# Patient Record
Sex: Female | Born: 1950 | Race: White | Hispanic: No | Marital: Married | Smoking: Never smoker
Health system: Southern US, Community
[De-identification: ages and names within clinical notes are randomized; demographics above are authoritative.]

## PROBLEM LIST (undated history)

## (undated) DIAGNOSIS — G250 Essential tremor: Secondary | ICD-10-CM

## (undated) DIAGNOSIS — K219 Gastro-esophageal reflux disease without esophagitis: Secondary | ICD-10-CM

## (undated) HISTORY — PX: COLONOSCOPY: SHX174

## (undated) HISTORY — PX: TONSILLECTOMY: SUR1361

## (undated) HISTORY — DX: Gastro-esophageal reflux disease without esophagitis: K21.9

## (undated) HISTORY — DX: Essential tremor: G25.0

---

## 1999-11-20 ENCOUNTER — Encounter: Payer: Self-pay | Admitting: Family Medicine

## 1999-11-20 ENCOUNTER — Encounter: Admission: RE | Admit: 1999-11-20 | Discharge: 1999-11-20 | Payer: Self-pay | Admitting: Family Medicine

## 2001-03-30 ENCOUNTER — Emergency Department (HOSPITAL_COMMUNITY): Admission: EM | Admit: 2001-03-30 | Discharge: 2001-03-31 | Payer: Self-pay | Admitting: Emergency Medicine

## 2001-12-15 ENCOUNTER — Encounter: Payer: Self-pay | Admitting: Family Medicine

## 2001-12-15 ENCOUNTER — Encounter: Admission: RE | Admit: 2001-12-15 | Discharge: 2001-12-15 | Payer: Self-pay | Admitting: Family Medicine

## 2004-03-15 ENCOUNTER — Emergency Department (HOSPITAL_COMMUNITY): Admission: EM | Admit: 2004-03-15 | Discharge: 2004-03-15 | Payer: Self-pay | Admitting: Emergency Medicine

## 2004-08-02 ENCOUNTER — Ambulatory Visit (HOSPITAL_COMMUNITY): Admission: RE | Admit: 2004-08-02 | Discharge: 2004-08-02 | Payer: Self-pay | Admitting: Family Medicine

## 2005-10-23 ENCOUNTER — Ambulatory Visit (HOSPITAL_COMMUNITY): Admission: RE | Admit: 2005-10-23 | Discharge: 2005-10-23 | Payer: Self-pay | Admitting: Family Medicine

## 2006-11-18 ENCOUNTER — Ambulatory Visit (HOSPITAL_COMMUNITY): Admission: RE | Admit: 2006-11-18 | Discharge: 2006-11-18 | Payer: Self-pay | Admitting: Family Medicine

## 2008-02-28 ENCOUNTER — Ambulatory Visit (HOSPITAL_COMMUNITY): Admission: RE | Admit: 2008-02-28 | Discharge: 2008-02-28 | Payer: Self-pay | Admitting: Family Medicine

## 2008-02-28 ENCOUNTER — Encounter (INDEPENDENT_AMBULATORY_CARE_PROVIDER_SITE_OTHER): Payer: Self-pay | Admitting: *Deleted

## 2008-03-02 ENCOUNTER — Ambulatory Visit (HOSPITAL_COMMUNITY): Admission: RE | Admit: 2008-03-02 | Discharge: 2008-03-02 | Payer: Self-pay | Admitting: Family Medicine

## 2009-05-18 ENCOUNTER — Encounter: Payer: Self-pay | Admitting: Internal Medicine

## 2009-05-18 ENCOUNTER — Ambulatory Visit: Payer: Self-pay | Admitting: Internal Medicine

## 2009-05-18 ENCOUNTER — Other Ambulatory Visit: Admission: RE | Admit: 2009-05-18 | Discharge: 2009-05-18 | Payer: Self-pay | Admitting: Internal Medicine

## 2010-01-02 ENCOUNTER — Ambulatory Visit (HOSPITAL_COMMUNITY): Admission: RE | Admit: 2010-01-02 | Discharge: 2010-01-02 | Payer: Self-pay | Admitting: Internal Medicine

## 2010-05-28 ENCOUNTER — Ambulatory Visit: Payer: Self-pay | Admitting: Internal Medicine

## 2010-05-28 ENCOUNTER — Other Ambulatory Visit: Admission: RE | Admit: 2010-05-28 | Discharge: 2010-05-28 | Payer: Self-pay | Admitting: Internal Medicine

## 2010-05-28 DIAGNOSIS — Z8614 Personal history of Methicillin resistant Staphylococcus aureus infection: Secondary | ICD-10-CM | POA: Insufficient documentation

## 2010-05-28 LAB — CONVERTED CEMR LAB
Bilirubin Urine: NEGATIVE
Ketones, urine, test strip: NEGATIVE
Protein, U semiquant: NEGATIVE
pH: 6.5

## 2010-05-29 ENCOUNTER — Encounter: Payer: Self-pay | Admitting: *Deleted

## 2010-05-29 LAB — CONVERTED CEMR LAB
AST: 22 units/L (ref 0–37)
Albumin: 4.8 g/dL (ref 3.5–5.2)
BUN: 18 mg/dL (ref 6–23)
Basophils Relative: 0.8 % (ref 0.0–3.0)
Bilirubin, Direct: 0.1 mg/dL (ref 0.0–0.3)
CO2: 29 meq/L (ref 19–32)
Cholesterol: 213 mg/dL — ABNORMAL HIGH (ref 0–200)
GFR calc non Af Amer: 84.88 mL/min (ref >60)
HCT: 38.1 % (ref 36.0–46.0)
Hemoglobin: 13 g/dL (ref 12.0–15.0)
Lymphocytes Relative: 36.3 % (ref 12.0–46.0)
MCHC: 34.2 g/dL (ref 30.0–36.0)
Monocytes Absolute: 0.4 10*3/uL (ref 0.1–1.0)
Monocytes Relative: 8.7 % (ref 3.0–12.0)
Neutro Abs: 2.5 10*3/uL (ref 1.4–7.7)
Neutrophils Relative %: 52.7 % (ref 43.0–77.0)
Platelets: 212 10*3/uL (ref 150.0–400.0)
Sodium: 139 meq/L (ref 135–145)
TSH: 2.06 microintl units/mL (ref 0.35–5.50)
Total Bilirubin: 0.7 mg/dL (ref 0.3–1.2)
Total CHOL/HDL Ratio: 3
Triglycerides: 91 mg/dL (ref 0.0–149.0)

## 2010-05-31 ENCOUNTER — Telehealth: Payer: Self-pay | Admitting: *Deleted

## 2010-06-03 ENCOUNTER — Telehealth: Payer: Self-pay | Admitting: *Deleted

## 2010-06-27 ENCOUNTER — Ambulatory Visit: Payer: Self-pay | Admitting: Family Medicine

## 2010-06-27 ENCOUNTER — Telehealth: Payer: Self-pay | Admitting: *Deleted

## 2010-06-27 DIAGNOSIS — M25559 Pain in unspecified hip: Secondary | ICD-10-CM

## 2010-07-23 ENCOUNTER — Ambulatory Visit
Admission: RE | Admit: 2010-07-23 | Discharge: 2010-07-23 | Payer: Self-pay | Source: Home / Self Care | Attending: Internal Medicine | Admitting: Internal Medicine

## 2010-07-23 ENCOUNTER — Encounter: Payer: Self-pay | Admitting: Internal Medicine

## 2010-08-04 ENCOUNTER — Encounter: Payer: Self-pay | Admitting: Family Medicine

## 2010-08-05 ENCOUNTER — Encounter: Payer: Self-pay | Admitting: Family Medicine

## 2010-08-11 LAB — CONVERTED CEMR LAB
ALT: 17 units/L (ref 0–35)
BUN: 14 mg/dL (ref 6–23)
Basophils Absolute: 0 10*3/uL (ref 0.0–0.1)
Calcium, Total (PTH): 10 mg/dL (ref 8.4–10.5)
Chloride: 106 meq/L (ref 96–112)
Creatinine, Ser: 0.7 mg/dL (ref 0.4–1.2)
GFR calc non Af Amer: 90.81 mL/min (ref >60)
Glucose, Bld: 84 mg/dL (ref 70–99)
HDL: 60.4 mg/dL (ref >39.00)
MCHC: 34.7 g/dL (ref 30.0–36.0)
MCV: 89.8 fL (ref 78.0–100.0)
Monocytes Absolute: 0.4 10*3/uL (ref 0.1–1.0)
Monocytes Relative: 10.5 % (ref 3.0–12.0)
Neutro Abs: 1.8 10*3/uL (ref 1.4–7.7)
Neutrophils Relative %: 52.1 % (ref 43.0–77.0)
PTH: 26.2 pg/mL (ref 14.0–72.0)
Potassium: 4 meq/L (ref 3.5–5.1)
RBC: 4.22 M/uL (ref 3.87–5.11)
RDW: 12.3 % (ref 11.5–14.6)
Sodium: 144 meq/L (ref 135–145)
Total Protein: 7.9 g/dL (ref 6.0–8.3)
VLDL: 18 mg/dL (ref 0.0–40.0)
WBC: 3.5 10*3/uL — ABNORMAL LOW (ref 4.5–10.5)

## 2010-08-13 NOTE — Progress Notes (Signed)
Summary: Needs Rx Called In  Phone Note Call from Patient Call back at [phone]   Caller: Patient Summary of Call: Pt seen last week possible staph infection was told to call back if no better to have something called in.  Not any better needs rx called in, will be going out of town tomorrow for the holidays Initial call taken by: Trixie Dredge,  June 03, 2010 8:34 AM  Follow-up for Phone Call        Spoke to pt and she states that the staph infection has gone from her nose to her ear. Rt side of nose is swollen and sore. Pt is also having rt ear is swollen and red. Pt states that clear fluid coming out of ear. [facility]. Follow-up by: Romualdo Bolk, CMA Duncan Dull),  June 03, 2010 8:53 AM  Additional Follow-up for Phone Call Additional follow up Details #1::        make sure she has no fever . still need a copy of her culture report.  doxycycline 100 by mouth two times a day disp 20 Make sure she doesnt think it could be   a cold sure herpes like infection as opposed to the staph infections she has gotten.  Seed care if persistent or  progressive   Additional Follow-up by: Madelin Headings MD,  June 03, 2010 9:33 AM    Additional Follow-up for Phone Call Additional follow up Details #2::    Pt aware of this and wants to go ahead with the antibiotic. Pt to call [facility] practice to get [place] a copy of the report.  Rx sent to pharmacy. Follow-up by: Romualdo Bolk, CMA (AAMA),  June 03, 2010 11:29 AM  New/Updated Medications: DOXYCYCLINE HYCLATE 100 MG TABS (DOXYCYCLINE HYCLATE) 1 by mouth two times a day for 10 days Prescriptions: DOXYCYCLINE HYCLATE 100 MG TABS (DOXYCYCLINE HYCLATE) 1 by mouth two times a day for 10 days  #20 x 0   Entered by:   Romualdo Bolk, CMA (AAMA)   Authorized by:   Madelin Headings MD   Signed by:   Romualdo Bolk, CMA (AAMA) on 06/03/2010   Method used:   [facility]        [facility]  [place] [address]  (retail)       [address]       [place], [place]  [address]       Ph: [phone] or [phone]       Fax: [phone]   RxID:   [id number]

## 2010-08-13 NOTE — Progress Notes (Signed)
Summary: Pt has question re: Potassium and Cholesterol results  Phone Note Call from Patient Call back at [phone]   Caller: Patient Summary of Call: Pt had questions re: the potassium and cholesterol lvl lab results she recvd.  Pls call asap.  Initial call taken by: Lucy Antigua,  May 31, 2010 1:16 PM  Follow-up for Phone Call        pt aware of results. Follow-up by: Romualdo Bolk, CMA (AAMA),  May 31, 2010 1:30 PM

## 2010-08-13 NOTE — Assessment & Plan Note (Signed)
Summary: cpx/pap/pt will come in fasting/njr   Vital Signs:  Patient profile:   60 year old female Menstrual status:  postmenopausal Height:      64.5 inches Weight:      133 pounds BMI:     22.56 Pulse rate:   60 / minute BP sitting:   100 / 60  (left arm) Cuff size:   regular  Vitals Entered By: Romualdo Bolk, CMA (AAMA) (May 28, 2010 9:12 AM) CC: CPX with pap, Pt is fasting for labs   History of Present Illness: Jenny [name] comes in today  for preventive visit . Since last visit  here  there have been no major changes in health status  but did have  a  MRSA   infection   [place]  and  had  i and d  at [facility]  ( she says went there cause  close to house and easy to get in )    got better with antibioitc also . Since then  her husband got a boil also and has frecurrent problems  He  goes   to [place]   to get checked.   Has ? about taking care of new born GC to be born Dec 6th  has had flu shot.  Never got dexa scan  .  forgot  was supposed to be on boniva but didnt take.  take ca vit d and exercise . No fam hx of hip fracture   Preventive Care Screening  Prior Values:    Pap Smear:  NEGATIVE FOR INTRAEPITHELIAL LESIONS OR MALIGNANCY. (05/18/2009)    Mammogram:  ASSESSMENT: Negative - BI-RADS 1^MM DIGITAL SCREENING (01/02/2010)    Colonoscopy:  normal (07/14/2006)    Last Tetanus Booster:  Historical (07/14/2006)   Preventive Screening-Counseling & Management  Alcohol-Tobacco     Alcohol drinks/day: 0     Smoking Status: never  Caffeine-Diet-Exercise     Caffeine use/day: 0.5 cup     Does Patient Exercise: yes     Type of exercise: walking     Times/week: 3  Hep-HIV-STD-Contraception     Dental Visit-last 6 months yes     Sun Exposure-Excessive: no  Safety-Violence-Falls     Seat Belt Use: yes     Firearms in the Home: firearms in the home     Firearm Counseling: not indicated; uses recommended firearm safety measures     Smoke Detectors:  yes  Current Medications (verified): 1)  Multivitamins   Tabs (Multiple Vitamin) 2)  Calcium Carbonate-Vitamin D 600-400 Mg-Unit  Tabs (Calcium Carbonate-Vitamin D) 3)  Biotin 10 Mg Tabs (Biotin) 4)  Fish Oil   Oil (Fish Oil)  Allergies (verified): 1)  * Fosamax Physical Exam Gener  Past History:  Past medical, surgical, family and social histories (including risk factors) reviewed, and no changes noted (except as noted below).  Past Medical History: G2 P2 Was placed on thyroid medicine for about a year for abnormal lab but the  normal labs  after stopping  Osteoporosis  by DEXA  2009   had se of  fosamax Hx of MRSA skin infection   Past Surgical History: Reviewed history from 05/18/2009 and no changes required. Tonsillectomy age 89  Past History:  Care Management: Gastroenterology:  Russella Dar  Family History: Reviewed history from 05/18/2009 and no changes required. Father: Cancer- unsure of type-Deceased Mother: overweight, high cholesterol, heart attacks, HBP Siblings: 2 brothers- Healthy  Mom OSteoporosis   Social History: Reviewed history from 05/18/2009 and  no changes required. 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  Originally from [place]   Review of Systems  The patient denies anorexia, fever, weight gain, vision loss, decreased hearing, hoarseness, chest pain, syncope, dyspnea on exertion, peripheral edema, prolonged cough, hemoptysis, abdominal pain, melena, hematochezia, severe indigestion/heartburn, hematuria, genital sores, muscle weakness, transient blindness, difficulty walking, depression, enlarged lymph nodes, angioedema, and breast masses.         teeth    and jaw grinding.    and then HA.   neck and temples no vision change  rest as  per hpi Physical Exam General Appearance: well developed, well nourished, no acute  distress Eyes: conjunctiva and lids normal, PERRLA, EOMI, WNL Ears, Nose, Mouth, Throat: TM clear, nares clear, oral exam WNL Neck: supple, no lymphadenopathy, no thyromegaly, no JVD Respiratory: clear to auscultation and percussion, respiratory effort normal Cardiovascular: regular rate and rhythm, S1-S2, no murmur, rub or gallop, no bruits, peripheral pulses normal and symmetric, no cyanosis, clubbing, edema or varicosities Chest: no scars, masses, tenderness; no asymmetry, skin changes, nipple discharge   Gastrointestinal: soft, non-tender; no hepatosplenomegaly, masses; active bowel sounds all quadrants, guaiac negative stool; no masses, tenderness, hemorrhoids  Genitourinary: no vaginal discharge, lesions; no masses or tenderness PAP done normal exam  NEG  cmt  Lymphatic: no cervical, axillary or inguinal adenopathy Musculoskeletal: gait normal, muscle tone and strength WNL, no joint swelling, effusions, discoloration, crepitus  Skin: clear, good turgor, color WNL, no rashes, lesions, or ulcerations Neurologic: normal mental status, normal reflexes, normal strength, sensation, and motion Psychiatric: alert; oriented to person, place and time Other Exam:    Impression & Recommendations:  Problem # 1:  HEALTH MAINTENANCE EXAM, ADULT (ICD-V70.0) get flu shot and  check on if got tdap  in past   for infant  protection.   Orders: TLB-TSH (Thyroid Stimulating Hormone) (84443-TSH) TLB-Hepatic/Liver Function Pnl (80076-HEPATIC) TLB-CBC Platelet - w/Differential (85025-CBCD) TLB-BMP (Basic Metabolic Panel-BMET) (80048-METABOL) TLB-Lipid Panel (80061-LIPID) UA Dipstick w/o Micro (automated)  (81003) Venipuncture (21308) Specimen Handling (65784)  Problem # 2:  ROUTINE GYNECOLOGICAL EXAM (ICD-V72.31) pap done nl exam.   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 with husband has active infection.      counseled and HO given   Problem # 4:  OSTEOPOROSIS BY DEXA (ICD-733.00) never followed  up  dexa  no fracture in family   has se of meds  . disc  ca vit d and exercise and repeat her dexa as I dont have the previous data. Her updated medication list for this problem includes:    Calcium Carbonate-vitamin D 600-400 Mg-unit Tabs (Calcium carbonate-vitamin d)  Vit D:50 (05/18/2009)Parathyroid:26.2 (05/18/2009)  Complete Medication List: 1)  Multivitamins Tabs (Multiple vitamin) 2)  Calcium Carbonate-vitamin D 600-400 Mg-unit Tabs (Calcium carbonate-vitamin d) 3)  Biotin 10 Mg Tabs (Biotin) 4)  Fish Oil Oil (Fish oil)  Patient Instructions: 1)  You will be informed of lab results when available.  2)  Get [place] a copy of the culture report from the MRSA infection. 3)  Call if getting an infection  and depending on signs  will guide treatment.  4)  Get  dexa scan   call results.  5)  Plan yearly check    Orders Added: 1)  TLB-TSH (Thyroid Stimulating Hormone) [84443-TSH] 2)  TLB-Hepatic/Liver Function Pnl [80076-HEPATIC] 3)  TLB-CBC Platelet - w/Differential [85025-CBCD] 4)  TLB-BMP (Basic Metabolic Panel-BMET) [80048-METABOL] 5)  TLB-Lipid Panel [80061-LIPID] 6)  UA Dipstick w/o Micro (automated)  [81003] 7)  Venipuncture [04540] 8)  Specimen Handling [99000] 9)  Est. Patient 40-64 years [99396] 10)  Pap Smear, Thin Prep ( Collection of) [Q0091] 11)  Est. Patient Level II [98119]    Laboratory Results   Urine Tests  Date/Time Received: May 28, 2010 10:28 AM   Routine Urinalysis   Color: yellow Appearance: Clear Glucose: negative   (Normal Range: Negative) Bilirubin: negative   (Normal Range: Negative) Ketone: negative   (Normal Range: Negative) Spec. Gravity: 1.015   (Normal Range: 1.003-1.035) Blood: negative   (Normal Range: Negative) pH: 6.5   (Normal Range: 5.0-8.0) Protein: negative   (Normal  Range: Negative) Urobilinogen: 0.2   (Normal Range: 0-1) Nitrite: negative   (Normal Range: Negative) Leukocyte Esterace: negative   (Normal Range: Negative)    Comments: Romualdo Bolk, CMA (AAMA)  May 28, 2010 10:29 AM

## 2010-08-13 NOTE — Letter (Signed)
Summary: Generic Letter  [place] [facility]  [address] [place], [place] [address]   Phone: [phone]  Fax: [phone]    05/29/2010  Waldemar Dickens [address] [place], [place]  [address]  Dear Ms. Emanuelson,  (1) TSH (TSH)   FastTSH                   2.06 uIU/mL                 0.35-5.50  Tests: (2) Hepatic/Liver Function Panel (HEPATIC)   Total Bilirubin           0.7 mg/dL                   5.2-8.4   Direct Bilirubin          0.1 mg/dL                   1.3-2.4   Alkaline Phosphatase      104 U/L                     39-117   AST                       22 U/L                      0-37   ALT                       20 U/L                      0-35   Total Protein             7.4 g/dL                    4.0-1.0   Albumin                   4.8 g/dL                    2.7-2.5  Tests: (3) CBC Platelet w/Diff (CBCD)   White Cell Count          4.7 K/uL                    4.5-10.5   Red Cell Count            4.24 Mil/uL                 3.87-5.11   Hemoglobin                13.0 g/dL                   36.6-44.0   Hematocrit                38.1 %                      36.0-46.0   MCV                       89.8 fl                     78.0-100.0   MCHC  34.2 g/dL                   32.9-51.8   RDW                       13.1 %                      11.5-14.6   Platelet Count            212.0 K/uL                  150.0-400.0   Neutrophil %              52.7 %                      43.0-77.0   Lymphocyte %              36.3 %                      12.0-46.0   Monocyte %                8.7 %                       3.0-12.0   Eosinophils%              1.5 %                       0.0-5.0   Basophils %               0.8 %                       0.0-3.0   Neutrophill Absolute      2.5 K/uL                    1.4-7.7   Lymphocyte Absolute       1.7 K/uL                    0.7-4.0   Monocyte Absolute         0.4 K/uL                    0.1-1.0  Eosinophils,  Absolute                             0.1 K/uL                    0.0-0.7   Basophils Absolute        0.0 K/uL                    0.0-0.1  Tests: (4) BMP (METABOL)   Sodium                    139 mEq/L                   135-145   Potassium            [H]  5.4 mEq/L                   3.5-5.1     Sample is slightly hemolyzed   Chloride  102 mEq/L                   96-112   Carbon Dioxide            29 mEq/L                    19-32   Glucose                   90 mg/dL                    16-10   BUN                       18 mg/dL                    9-60   Creatinine                0.7 mg/dL                   4.5-4.0   Calcium                   10.4 mg/dL                  9.8-11.9   GFR                       84.88 mL/min                >60  Tests: (5) Lipid Panel (LIPID)   Cholesterol          [H]  213 mg/dL                   1-478     ATP III Classification            Desirable:  < 200 mg/dL                    Borderline High:  200 - 239 mg/dL               High:  > = 240 mg/dL   Triglycerides             91.0 mg/dL                  2.9-562.1     Normal:  <150 mg/dL     Borderline High:  308 - 199 mg/dL   HDL                       65.78 mg/dL                 >46.96   VLDL Cholesterol          18.2 mg/dL                  2.9-52.8  CHO/HDL Ratio:  CHD Risk                             3                    Men          Women     1/2 Average Risk     3.4          3.3  Average Risk          5.0          4.4     2X Average Risk          9.6          7.1     3X Average Risk          15.0          11.0                           Tests: (6) Cholesterol LDL - Direct (DIRLDL)  Cholesterol LDL - Direct                             125.4 mg/dL     Optimal:  <161 mg/dL     Near or Above Optimal:  100-129 mg/dL     Borderline High:  096-045 mg/dL     High:  409-811 mg/dL     Very High:  >914 mg/dL   Your results are normal. The potassium elevation is not significant but a  lab drawing effect. If you have any questions, please give [place] a call at [phone].         Sincerely,   Tor Netters, CMA (AAMA)

## 2010-08-15 NOTE — Progress Notes (Signed)
Summary: leg pain  Phone Note Call from Patient Call back at Home Phone [phone]   Caller: Patient---triage vm Summary of Call: complains of a kink between leg and hip. She cannot bend or sit down. In horrific pain. please return call, asap. Initial call taken by: Warnell Forester,  June 27, 2010 8:47 AM  Follow-up for Phone Call        Spoke to pt- she woke up the other am with left hip pain. Pt states that she can't bend it without having pain shooting thru it. She can't even set down to go to the bathroom. No injury to the hip area. Pt has never seen a ortho before.   Per Dr. Fabian Sharp- Have her go to [facility] or see Dr. Gary Fleet with [facility] to see Dr. Caryl Never Follow-up by: Romualdo Bolk, CMA Duncan Dull),  June 27, 2010 9:49 AM

## 2010-08-15 NOTE — Assessment & Plan Note (Signed)
Summary: left hip pain/ssc   Vital Signs:  Patient profile:   60 year old female Menstrual status:  postmenopausal Temp:     98.5 degrees F oral BP standing:   100 / 72  (left arm)  Vitals Entered By: Sid Falcon LPN (June 27, 2010 11:02 AM)  History of Present Illness: L pelvis/medial hip pain for 2 days. Noted after rolling over in bed.  no mass or swelling. Pain is sharp and not at rest but worse with hip flexion.  No back pain.  Slightly better with muscle relaxer.  Did not try NSAIDS no fever, appetite change, injury, or weight loss. Does have hx of osteoporosis.  Takes calcium and Vit D.  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  Originally from [place]   Risk Factors: Alcohol Use: 0 (05/28/2010) Caffeine Use: 0.5 cup (05/28/2010) Exercise: yes (05/28/2010)  Risk Factors: Smoking Status: never (05/28/2010) PMH-FH-SH reviewed for relevance  Review of Systems  The patient denies anorexia, fever, weight loss, peripheral edema, abdominal pain, melena, hematochezia, hematuria, incontinence, muscle weakness, and enlarged lymph nodes.    Physical Exam  General:  Well-developed,well-nourished,in no acute distress; alert,appropriate and cooperative throughout  examination Lungs:  Normal respiratory effort, chest expands symmetrically. Lungs are clear to auscultation, no crackles or wheezes. Heart:  Normal rate and regular rhythm. S1 and S2 normal without gallop, murmur, click, rub or other extra sounds. Msk:  No deformity or scoliosis noted of thoracic or lumbar spine.   Extremities:  Full ROM L hip.  Mild pain with hip flexion but not with adduction or abduction.  Knee full ROM. Neurologic:  full strength with plantar and dorsiflexion.   Impression & Recommendations:  Problem # 1:  HIP PAIN (ICD-719.45) suspect hip flexor muscle strain. Try aleve for next week and plain films of hip and pelvis if no better in 1-2 weeks.  Problem # 2:  OSTEOPOROSIS BY DEXA (ICD-733.00) will discuss with Dr Fabian Sharp in Jan. Her updated medication list for this problem includes:    Calcium Carbonate-vitamin D 600-400 Mg-unit Tabs (Calcium carbonate-vitamin d)  Complete Medication List: 1)  Multivitamins Tabs (Multiple vitamin) 2)  Calcium Carbonate-vitamin D 600-400 Mg-unit Tabs (Calcium carbonate-vitamin d) 3)  Biotin 10 Mg Tabs (Biotin) 4)  Fish Oil Oil (Fish oil) 5)  Doxycycline Hyclate 100 Mg Tabs (Doxycycline hyclate) .Marland Kitchen.. 1 by mouth two times a day for 10 days  Patient Instructions: 1)  Try Alleve 2 tablets every 12 hours as needed pain  2)  Touch base by next week if no better.   Orders Added: 1)  Est. Patient Level III [91478]

## 2010-08-15 NOTE — Miscellaneous (Signed)
Summary: BONE DENSITY  Clinical Lists Changes  Orders: Added new Test order of T-Bone Densitometry (77080) - Signed Added new Test order of T-Lumbar Vertebral Assessment (77082) - Signed 

## 2010-11-20 ENCOUNTER — Telehealth: Payer: Self-pay | Admitting: Gastroenterology

## 2010-11-20 NOTE — Telephone Encounter (Signed)
Patient advised that no polyps and she denies a family history of colon CA, change in bowel habits, colon polyps or rectal bleeding.  I have advised her I am waiting on her paper chart for him to review appropriate colon recall

## 2010-11-21 NOTE — Telephone Encounter (Signed)
Colon normal in 10/2003 and at that time I recommended a colonoscopy for routine screening in 7 years. Guidelines now suggest 10 years for routine screening. We can change her recall to 10/2013 or she can have screening now if she prefers.

## 2010-11-21 NOTE — Telephone Encounter (Signed)
Paper chart in your office for review.  Please advise if colon is due now.

## 2010-11-22 NOTE — Telephone Encounter (Signed)
I have left a message for the patient with Sr Stark's recommendations.  Based on our conversation earlier in he week she was not wanting colon now.  I have changed her recall to 10/2013.  She is asked to call back if she would like to proceed with colon now

## 2011-03-26 ENCOUNTER — Encounter: Payer: Self-pay | Admitting: Internal Medicine

## 2011-03-27 ENCOUNTER — Encounter: Payer: Self-pay | Admitting: Internal Medicine

## 2011-03-27 ENCOUNTER — Other Ambulatory Visit (HOSPITAL_COMMUNITY)
Admission: RE | Admit: 2011-03-27 | Discharge: 2011-03-27 | Disposition: A | Discharge status: Home / Self Care | Payer: Managed Care, Other (non HMO) | Source: Ambulatory Visit | Attending: Internal Medicine | Admitting: Internal Medicine

## 2011-03-27 ENCOUNTER — Ambulatory Visit (INDEPENDENT_AMBULATORY_CARE_PROVIDER_SITE_OTHER): Payer: Managed Care, Other (non HMO) | Admitting: Internal Medicine

## 2011-03-27 VITALS — BP 100/60 | HR 62 | Temp 98.5°F | Wt 135.0 lb

## 2011-03-27 DIAGNOSIS — R519 Headache, unspecified: Secondary | ICD-10-CM | POA: Insufficient documentation

## 2011-03-27 DIAGNOSIS — Z01419 Encounter for gynecological examination (general) (routine) without abnormal findings: Secondary | ICD-10-CM | POA: Insufficient documentation

## 2011-03-27 DIAGNOSIS — Z113 Encounter for screening for infections with a predominantly sexual mode of transmission: Secondary | ICD-10-CM | POA: Insufficient documentation

## 2011-03-27 DIAGNOSIS — N760 Acute vaginitis: Secondary | ICD-10-CM | POA: Insufficient documentation

## 2011-03-27 DIAGNOSIS — J329 Chronic sinusitis, unspecified: Secondary | ICD-10-CM

## 2011-03-27 DIAGNOSIS — R51 Headache: Secondary | ICD-10-CM

## 2011-03-27 MED ORDER — AMOXICILLIN-POT CLAVULANATE 875-125 MG PO TABS: 1.0000 | ORAL_TABLET | Freq: Two times a day (BID) | ORAL | Status: AC

## 2011-03-27 NOTE — Progress Notes (Signed)
Subjective:    Patient ID: Jenny [name], female    DOB: 1950/12/15, 60 y.o.   MRN: [id number]  HPI Pt Comes in today for acute visit for a number of concerns :  Sinus congestion for 2 weeks and now cough uncontrollable at night.  NO fever.  Yesterday pm had face pain  .cough and phlegm in the am and mid chest burning with this . No sob or current fever . No tobacco or asthma remot hx of  Walking pneumonia .   Headache for a bout a year Was seen for ha last year  By another practitioner  And no head scan done  . ? If head grinding and jaw clenching  Causing the sx.   rx with  Muscle relaxant.    Has been using tylenol prn  Not daily . Worried about serious disease.  One day had dizziness and imbalance .has fam hx of cancer . No new sx today   1-2 weeks of vaginal discharge without odor or itch and no rx No  recent antibiotic.    No rx  Discharge is   Dark  Thick .  Menopause in 50 s no bleeding.    Married one partner  Due for a check up pap  Has appt in december Review of Systems ROS:  GEN/ HEENTNo fever, significant weight changes sweats  vision problems hearing changes, CV/ PULM; No chest pain shortness of breath cough, syncope,edema  change in exercise tolerance. GI /GU: No adominal pain, vomiting, change in bowel habits. No blood in the stool.  SKIN/HEME: ,no acute skin rashes suspicious lesions or bleeding. No lymphadenopathy, nodules, masses.  NEURO/ PSYCH:  No neurologic signs such as weakness numbness No depression anxiety. IMM/ Allergy: No unusual infections.  Or  Allergy .   REST of 12 system review negative rest as per hpi   Past Medical History  Diagnosis Date  . Osteoporosis     by dexa 2009 had se of fosamax  . Hx MRSA infection    Past Surgical History  Procedure Date  . Tonsillectomy     age 29    reports that she has never smoked. She does not have any smokeless tobacco history on file. She reports that she does not drink alcohol or use illicit  drugs. family history includes Cancer in her father; Heart disease in her mother; Hyperlipidemia in her mother; and Hypertension in her mother. Allergies  Allergen Reactions  . Alendronate Sodium     REACTION: GI se       Objective:   Physical Exam Physical Exam: Vital signs reviewed VHQ:IONG is a well-developed well-nourished alert cooperative  white female who appears her stated age in no acute distress.  Congested  HEENT: normocephalic  traumatic , Eyes: PERRL EOM's full, conjunctiva clear, Nares: paten,t no deformity discharge TENDER anterior right paranasal area ., Ears: no deformity EAC's clear TMs with normal landmarks. Mouth: clear OP, no lesions, edema.  Moist mucous membranes. Dentition in adequate repair. NECK: supple without masses, thyromegaly or bruits. CHEST/PULM:  Clear to auscultation and percussion breath sounds equal no wheeze , rales or rhonchi. No chest wall deformities or tenderness. CV: PMI is nondisplaced, S1 S2 no gallops, murmurs, rubs. Peripheral pulses are full without delay ABDOMEN: Bowel sounds normal nontender  No guard or rebound, no hepato splenomegal no CVA tenderness.  Extremtities:  No clubbing cyanosis or edema, no acute joint swelling or redness no focal atrophy NEURO:  Oriented x3, cranial nerves 3-12  appear to be intact, no obvious focal weakness,gait within normal limits  SKIN: No acute rashes normal turgor, color, no bruising or petechiae. PSYCH: Oriented, good eye contact, no obvious depression anxiety, cognition and judgment appear normal. Pelvic: NL ext GU, labia clear without lesions or rash . Vagina no lesions .Cervix: clear  UTERUS: Neg CMT Adnexa:  clear no masses . PAP done  Minimal discharge  cx os is clear  Pap vag infection  Screen done  Sent for vaginal eval and sti screen and pap     Assessment & Plan:  Prolonged URI prob bact sinusitis   treate empirically for now .   Risk benefit of medication discussed.  noevidene of pneumonia  today Daily HAs   Apparently for a year and problematic and ocass associated sx  REC   Headache referral.  They can order imaging if advised  Vaginitis : ?cause  screen done today   Call results . [facility]  Flu shot today   reschedule cpx pv.   ? Answered about supplements

## 2011-03-27 NOTE — Patient Instructions (Signed)
Will notify you for headache referral. Take antibiotic for presumed bacterial sinusitis Will notify you  of labs when available. Will call about  Wellness appt.

## 2011-04-02 ENCOUNTER — Encounter: Payer: Self-pay | Admitting: *Deleted

## 2011-04-16 ENCOUNTER — Telehealth: Payer: Self-pay | Admitting: Internal Medicine

## 2011-04-16 NOTE — Telephone Encounter (Signed)
Pt would like a cpx first wk in dec. Can I create 30 min slot?

## 2011-04-18 NOTE — Telephone Encounter (Signed)
Ok  On MON tues or wednesday

## 2011-04-21 NOTE — Telephone Encounter (Signed)
Pt is sch for cpx on 06/16/2011 815am

## 2011-06-16 ENCOUNTER — Ambulatory Visit (INDEPENDENT_AMBULATORY_CARE_PROVIDER_SITE_OTHER): Payer: Managed Care, Other (non HMO) | Admitting: Internal Medicine

## 2011-06-16 ENCOUNTER — Encounter: Payer: Self-pay | Admitting: Internal Medicine

## 2011-06-16 VITALS — BP 120/60 | HR 60 | Ht 65.0 in | Wt 136.0 lb

## 2011-06-16 DIAGNOSIS — Z0000 Encounter for general adult medical examination without abnormal findings: Secondary | ICD-10-CM | POA: Insufficient documentation

## 2011-06-16 DIAGNOSIS — M810 Age-related osteoporosis without current pathological fracture: Secondary | ICD-10-CM

## 2011-06-16 LAB — CBC WITH DIFFERENTIAL/PLATELET
Basophils Relative: 0.5 % (ref 0.0–3.0)
Eosinophils Absolute: 0 10*3/uL (ref 0.0–0.7)
Eosinophils Relative: 0.9 % (ref 0.0–5.0)
Hemoglobin: 13.3 g/dL (ref 12.0–15.0)
Lymphs Abs: 0.7 10*3/uL (ref 0.7–4.0)
MCHC: 33.9 g/dL (ref 30.0–36.0)
Monocytes Relative: 7.8 % (ref 3.0–12.0)
Neutro Abs: 3.8 10*3/uL (ref 1.4–7.7)
RBC: 4.42 Mil/uL (ref 3.87–5.11)
RDW: 13.4 % (ref 11.5–14.6)

## 2011-06-16 LAB — HEPATIC FUNCTION PANEL
Alkaline Phosphatase: 106 U/L (ref 39–117)
Total Bilirubin: 0.6 mg/dL (ref 0.3–1.2)
Total Protein: 7.6 g/dL (ref 6.0–8.3)

## 2011-06-16 LAB — LIPID PANEL
Cholesterol: 204 mg/dL — ABNORMAL HIGH (ref 0–200)
Total CHOL/HDL Ratio: 3

## 2011-06-16 LAB — BASIC METABOLIC PANEL
BUN: 18 mg/dL (ref 6–23)
Calcium: 9.7 mg/dL (ref 8.4–10.5)
Creatinine, Ser: 0.8 mg/dL (ref 0.4–1.2)
Glucose, Bld: 89 mg/dL (ref 70–99)
Sodium: 140 mEq/L (ref 135–145)

## 2011-06-16 LAB — LDL CHOLESTEROL, DIRECT: Direct LDL: 120.6 mg/dL

## 2011-06-16 LAB — TSH: TSH: 2.38 u[IU]/mL (ref 0.35–5.50)

## 2011-06-16 NOTE — Progress Notes (Signed)
  Subjective:    Patient ID: Jenny [name], female    DOB: 05/24/1951, 60 y.o.   MRN: [id number]  HPI Patient comes in today for Preventive Health Care visit  No major change in health status since last visit . Had eye check.  Getting eye check  ? Age changes. Exercising no ortho issues .  Now that are limiting. UTD on shots and pap done last time     Review of Systems ROS:  GEN/ HEENTNo fever, significant weight changes sweats headaches vision problems hearing changes, CV/ PULM; No chest pain shortness of breath cough, syncope,edema  change in exercise tolerance. GI /GU: No adominal pain, vomiting, change in bowel habits. No blood in the stool. No significant GU symptoms. SKIN/HEME: ,no acute skin rashes suspicious lesions or bleeding. No lymphadenopathy, nodules, masses.  NEURO/ PSYCH:  No neurologic signs such as weakness numbness No depression anxiety. IMM/ Allergy: No unusual infections.  Allergy .   REST of 12 system review negative  Past history family history social history reviewed in the electronic medical record. Pos dementia and cancer      Objective:   Physical Exam Physical Exam: Vital signs reviewed WUJ:WJXB is a well-developed well-nourished alert cooperative  white female who appears her stated age in no acute distress.  HEENT: normocephalic  traumatic , Eyes: PERRL EOM's full, conjunctiva clear, Nares: paten,t no deformity discharge or tenderness., Ears: no deformity EAC's clear TMs with normal landmarks. Mouth: clear OP, no lesions, edema.  Moist mucous membranes. Dentition in adequate repair. NECK: supple without masses, thyromegaly or bruits. CHEST/PULM:  Clear to auscultation and percussion breath sounds equal no wheeze , rales or rhonchi. No chest wall deformities or tenderness. CV: PMI is nondisplaced, S1 S2 no gallops, murmurs, rubs. Peripheral pulses are full without delay.No JVD .  Breast: normal by inspection . No dimpling, discharge, masses,  tenderness or discharge . LN: no cervical axillary inguinal adenopathy  ABDOMEN: Bowel sounds normal nontender  No guard or rebound, no hepato splenomegal no CVA tenderness.  No hernia. Extremtities:  No clubbing cyanosis or edema, no acute joint swelling or redness no focal atrophy NEURO:  Oriented x3, cranial nerves 3-12 appear to be intact, no obvious focal weakness,gait within normal limits no abnormal reflexes or asymmetrical SKIN: No acute rashes normal turgor, color, no bruising or petechiae. PSYCH: Oriented, good eye contact, no obvious depression anxiety, cognition and judgment appear normal.  PAP ddone in fall is normal        Assessment & Plan:  Preventive Health Care Counseled regarding healthy nutrition, exercise, sleep, injury prevention, calcium vit d and healthy weight . Up to date  on healthcare parameters per hx   Will notify  labs when available.  Hx of abnormal dexa in the past  Will review .   After pt left found  Last dexa 2009  She did indeed have -2.8 at ls spine  Borderline osteoporosis . Consider

## 2011-06-16 NOTE — Patient Instructions (Signed)
Continue lifestyle intervention healthy eating and exercise .  

## 2011-06-17 DIAGNOSIS — M810 Age-related osteoporosis without current pathological fracture: Secondary | ICD-10-CM | POA: Insufficient documentation

## 2011-06-17 NOTE — Progress Notes (Signed)
Quick Note:  Left message to call back. ______ 

## 2011-07-07 ENCOUNTER — Encounter: Payer: Self-pay | Admitting: Internal Medicine

## 2011-09-29 ENCOUNTER — Telehealth: Payer: Self-pay | Admitting: Internal Medicine

## 2011-09-29 DIAGNOSIS — R51 Headache: Secondary | ICD-10-CM

## 2011-09-29 NOTE — Telephone Encounter (Signed)
Pt wants a referral to a headache specialist. Pt is having daily to every other day headaches. No vomiting, sensitivity to light or sound with headaches. But does having nausea with headaches.

## 2011-09-29 NOTE — Telephone Encounter (Signed)
Per Dr. Panosh- ok to do referral. 

## 2011-09-29 NOTE — Telephone Encounter (Signed)
Pt requesting you contact her in regard to headaches that she had spoke to Dr. Fabian Sharp about in last visit.

## 2011-10-10 ENCOUNTER — Other Ambulatory Visit: Payer: Self-pay | Admitting: Internal Medicine

## 2011-10-10 DIAGNOSIS — Z1231 Encounter for screening mammogram for malignant neoplasm of breast: Secondary | ICD-10-CM

## 2011-10-13 ENCOUNTER — Ambulatory Visit (HOSPITAL_COMMUNITY)
Admission: RE | Admit: 2011-10-13 | Discharge: 2011-10-13 | Disposition: A | Discharge status: Home / Self Care | Payer: Managed Care, Other (non HMO) | Source: Ambulatory Visit | Attending: Internal Medicine | Admitting: Internal Medicine

## 2011-10-13 DIAGNOSIS — Z1231 Encounter for screening mammogram for malignant neoplasm of breast: Secondary | ICD-10-CM | POA: Insufficient documentation

## 2012-04-23 ENCOUNTER — Encounter: Payer: Self-pay | Admitting: Internal Medicine

## 2012-05-04 ENCOUNTER — Encounter: Payer: Self-pay | Admitting: Internal Medicine

## 2012-06-14 ENCOUNTER — Other Ambulatory Visit (INDEPENDENT_AMBULATORY_CARE_PROVIDER_SITE_OTHER): Payer: BC Managed Care – PPO

## 2012-06-14 DIAGNOSIS — Z0000 Encounter for general adult medical examination without abnormal findings: Secondary | ICD-10-CM

## 2012-06-14 LAB — HEPATIC FUNCTION PANEL
Albumin: 4.2 g/dL (ref 3.5–5.2)
Bilirubin, Direct: 0 mg/dL (ref 0.0–0.3)
Total Bilirubin: 0.5 mg/dL (ref 0.3–1.2)

## 2012-06-14 LAB — CBC WITH DIFFERENTIAL/PLATELET
Basophils Relative: 1.4 % (ref 0.0–3.0)
HCT: 38.8 % (ref 36.0–46.0)
Lymphs Abs: 1.3 10*3/uL (ref 0.7–4.0)
MCHC: 33 g/dL (ref 30.0–36.0)
MCV: 88.9 fl (ref 78.0–100.0)
Neutrophils Relative %: 45.8 % (ref 43.0–77.0)
Platelets: 192 10*3/uL (ref 150.0–400.0)
RDW: 13.5 % (ref 11.5–14.6)
WBC: 3.3 10*3/uL — ABNORMAL LOW (ref 4.5–10.5)

## 2012-06-14 LAB — BASIC METABOLIC PANEL
CO2: 28 mEq/L (ref 19–32)
Glucose, Bld: 92 mg/dL (ref 70–99)
Potassium: 5.4 mEq/L — ABNORMAL HIGH (ref 3.5–5.1)
Sodium: 141 mEq/L (ref 135–145)

## 2012-06-14 LAB — TSH: TSH: 4.61 u[IU]/mL (ref 0.35–5.50)

## 2012-06-14 LAB — LIPID PANEL
HDL: 56.1 mg/dL (ref >39.00)
VLDL: 20.6 mg/dL (ref 0.0–40.0)

## 2012-06-21 ENCOUNTER — Ambulatory Visit: Payer: Managed Care, Other (non HMO) | Admitting: Internal Medicine

## 2012-06-21 DIAGNOSIS — Z0289 Encounter for other administrative examinations: Secondary | ICD-10-CM

## 2012-06-21 NOTE — Progress Notes (Signed)
No chief complaint on file. pt ns for her appt ehr reviewed

## 2012-07-01 ENCOUNTER — Telehealth: Payer: Self-pay | Admitting: Family Medicine

## 2012-07-01 NOTE — Telephone Encounter (Signed)
This patient was suppose to have OV with you on 06/21/12.  She did not show.  She is scheduled to come and see you on 08/18/12.  What would you like for me to tell her about her lab results?

## 2012-07-04 NOTE — Telephone Encounter (Signed)
Can tell pt results  potassium still slightly high .Make sure not taking  Any potassium supplements.  Can review labs at her OV.

## 2012-07-05 NOTE — Telephone Encounter (Signed)
Left message on cell for the pt to return my call.  Also called home #.  Unable to leave message.

## 2012-07-05 NOTE — Telephone Encounter (Signed)
The pt was notified by telephone and a copy of the results sent by mail.

## 2012-08-09 ENCOUNTER — Ambulatory Visit (INDEPENDENT_AMBULATORY_CARE_PROVIDER_SITE_OTHER): Payer: BC Managed Care – PPO | Admitting: Internal Medicine

## 2012-08-09 ENCOUNTER — Encounter: Payer: Self-pay | Admitting: Internal Medicine

## 2012-08-09 VITALS — BP 112/80 | HR 96 | Temp 97.7°F | Ht 65.0 in | Wt 139.0 lb

## 2012-08-09 DIAGNOSIS — M546 Pain in thoracic spine: Secondary | ICD-10-CM | POA: Insufficient documentation

## 2012-08-09 DIAGNOSIS — Z0000 Encounter for general adult medical examination without abnormal findings: Secondary | ICD-10-CM

## 2012-08-09 DIAGNOSIS — H4720 Unspecified optic atrophy: Secondary | ICD-10-CM

## 2012-08-09 DIAGNOSIS — R7989 Other specified abnormal findings of blood chemistry: Secondary | ICD-10-CM

## 2012-08-09 DIAGNOSIS — R946 Abnormal results of thyroid function studies: Secondary | ICD-10-CM

## 2012-08-09 DIAGNOSIS — E875 Hyperkalemia: Secondary | ICD-10-CM | POA: Insufficient documentation

## 2012-08-09 LAB — BASIC METABOLIC PANEL
CO2: 30 mEq/L (ref 19–32)
Calcium: 10 mg/dL (ref 8.4–10.5)
Creatinine, Ser: 0.8 mg/dL (ref 0.4–1.2)
GFR: 82.97 mL/min (ref >60.00)
Glucose, Bld: 95 mg/dL (ref 70–99)

## 2012-08-09 LAB — T4, FREE: Free T4: 0.77 ng/dL (ref 0.60–1.60)

## 2012-08-09 LAB — TSH: TSH: 1.18 u[IU]/mL (ref 0.35–5.50)

## 2012-08-09 NOTE — Progress Notes (Signed)
Chief Complaint  Patient presents with  . Annual Exam    HPI: Patient comes in today for Preventive Health Care visit   to discuss other issues   Some back pan in am stiff ot get  Up in am.   Some achiness upper back   No cough or sob. Eales like when she had pleurisy. But not associated with respiration or exercise Takes tylenol every day  Helps some  ... No positioning  change .    3 x per  Week treadmil problem  Asks about travel advice going on a [place] cruise how to avoid GI illness.  Patient was told a few years ago by eye Dr. Hughes [employer] that she had optic atrophy. No symptoms of such had an eye check over a year ago from a doctor in [place] (because of insurance network limitations) was told  no evidence of such asks today about this   ROS:  GEN/ HEENT: No fever, significant weight changes sweats headaches vision problems hearing changes, CV/ PULM; No chest pain shortness of breath cough, syncope,edema  change in exercise tolerance. GI /GU: No adominal pain, vomiting, change in bowel habits. No blood in the stool. No significant GU symptoms. SKIN/HEME: ,no acute skin rashes suspicious lesions or bleeding. No lymphadenopathy, nodules, masses.  NEURO/ PSYCH:  No neurologic signs such as weakness numbness. No depression anxiety. IMM/ Allergy: No unusual infections.  Allergy .   REST of 12 system review negative except as per HPI   Past Medical History  Diagnosis Date  . Osteoporosis     by dexa 2009 had se of fosamax  . Hx MRSA infection   . Abnormal thyroid blood test     rx wiht med briefly resolved no sx     Family History  Problem Relation Age of Onset  . Hyperlipidemia Mother   . Heart disease Mother   . Hypertension Mother   . Cancer Father     History   Social History  . Marital Status: Married    Spouse Name: N/A    Number of Children: N/A  . Years of Education: N/A   Social History Main Topics  . Smoking status: Never Smoker   .  Smokeless tobacco: None  . Alcohol Use: No  . Drug Use: No  . Sexually Active:    Other Topics Concern  . 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    Outpatient Encounter Prescriptions as of 08/09/2012  Medication Sig Dispense Refill  . aspirin 81 MG tablet Take 81 mg by mouth daily.        . [DISCONTINUED] Biotin 10 MG TABS Take by mouth.        . [DISCONTINUED] calcium carbonate (OS-CAL - DOSED IN MG OF ELEMENTAL CALCIUM) 1250 MG tablet Take 1 tablet by mouth daily.        . [DISCONTINUED] Cyanocobalamin (VITAMIN B 12 PO) Take by mouth.        . [DISCONTINUED] fish oil-omega-3 fatty acids 1000 MG capsule Take 2 g by mouth daily.        . [DISCONTINUED] MULTIPLE VITAMIN PO Take by mouth.          EXAM:  BP 112/80  Pulse 96  Temp 97.7 F (36.5 C) (Oral)  Ht 5\' 5"  (1.651 m)  Wt 139 lb (63.05 kg)  BMI 23.13 kg/m2  SpO2 97%  Body mass index is 23.13 kg/(m^2).  Physical Exam: Vital signs reviewed GNF:AOZH is a well-developed well-nourished alert cooperative   female who appears her stated age in no acute distress.  HEENT: normocephalic atraumatic , Eyes: PERRL EOM's full, conjunctiva clear, Nares: paten,t no deformity discharge or tenderness., Ears: no deformity EAC's clear TMs with normal landmarks. Mouth: clear OP, no lesions, edema.  Moist mucous membranes. Dentition in adequate repair. NECK: supple without masses, thyromegaly or bruits. CHEST/PULM:  Clear to auscultation and percussion breath sounds equal no wheeze , rales or rhonchi. No chest wall deformities or tenderness. CV: PMI is nondisplaced, S1 S2 no gallops, murmurs, rubs. Peripheral pulses are full without delay.No JVD .  Breast: normal by inspection . No  dimpling, discharge, masses, tenderness or discharge . ABDOMEN: Bowel sounds normal nontender  No guard or rebound, no hepato splenomegal no CVA tenderness.  No hernia. Extremtities:  No clubbing cyanosis or edema, no acute joint swelling or redness no focal atrophy NEURO:  Oriented x3, cranial nerves 3-12 appear to be intact, no obvious focal weakness,gait within normal limits no abnormal reflexes or asymmetrical SKIN: No acute rashes normal turgor, color, no bruising or petechiae. PSYCH: Oriented, good eye contact, no obvious depression anxiety, cognition and judgment appear normal. LN: no cervical axillary inguinal adenopathy  Lab Results  Component Value Date   WBC 3.3* 06/14/2012   HGB 12.8 06/14/2012   HCT 38.8 06/14/2012   PLT 192.0 06/14/2012   GLUCOSE 95 08/09/2012   CHOL 207* 06/14/2012   TRIG 103.0 06/14/2012   HDL 56.10 06/14/2012   LDLDIRECT 136.4 06/14/2012   ALT 31 06/14/2012   AST 26 06/14/2012   NA 140 08/09/2012   K 5.3* 08/09/2012   CL 103 08/09/2012   CREATININE 0.8 08/09/2012   BUN 20 08/09/2012   CO2 30 08/09/2012   TSH 1.18 08/09/2012    ASSESSMENT AND PLAN:  Discussed the following assessment and plan:  1. Visit for preventive health examination     get shingles vaccine when possible mammo  colon usually every 10 years   2. Back pain, thoracic  DG Chest 2 View, Basic metabolic panel, TSH, T4, free   seems mechanichal but a bit atypical consider c xray   can wait but put order in  today  3. Serum potassium elevated  Basic metabolic panel, TSH, T4, free   still seems like lab artifact but was present on last visit  recheck today  4. 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     Patient Care Team: Madelin Headings, MD as PCP - General Meryl Dare, MD,FACG (Gastroenterology) Patient Instructions    Get a ophthalmologist  For an eye exam   To be able to decide if optic atrophy is a condition that you have.    Then if needed may get neurology about this finding and headaches although our exam is good today.  The back discomfort seems to be mechanical i nature and  Good back hygiene may be helpful .    We can get a chest x ray to be  sure no lung issues but your exam is normal.  The potassium issue may be a lab artifact we can repeat this test today  alos with thyroid test to be sure ok. Can use otc med for sea sickness if needed   Can get shingles vaccine at any time . Mammogram at least every 2 years  Or less. Will let you know if any other intervention needed.  If doing well preventive visit in a year  . If having issues with headaches then ROV  Earlier.  Preventive Care for Adults, Female A healthy lifestyle and preventive care can promote health and wellness. Preventive health guidelines for women include the following key practices.  A routine yearly physical is a good way to check with your caregiver about your health and preventive screening. It is a chance to share any concerns and updates on your health, and to receive a thorough exam.  Visit your dentist for a routine exam and preventive care every 6 months. Brush your teeth twice a day and floss once a day. Good oral hygiene prevents tooth decay and gum disease.  The frequency of eye exams is based on your age, health, family medical history, use of contact lenses, and other factors. Follow your caregiver's recommendations for frequency of eye exams.  Eat a healthy diet. Foods like vegetables, fruits, whole grains, low-fat dairy products, and lean protein foods contain the nutrients you need without too many calories. Decrease your intake of foods high in solid fats, added sugars, and salt. Eat the right amount of calories for you.Get information about a proper diet from your caregiver, if necessary.  Regular physical  exercise is one of the most important things you can do for your health. Most adults should get at least 150 minutes of moderate-intensity exercise (any activity that increases your heart rate and causes you to sweat) each week. In addition, most adults need muscle-strengthening exercises on 2 or more days a week.  Maintain a healthy weight. The body mass index (BMI) is a screening tool to identify possible weight problems. It provides an estimate of body fat based on height and weight. Your caregiver can help determine your BMI, and can help you achieve or maintain a healthy weight.For adults 20 years and older:  A BMI below 18.5 is considered underweight.  A BMI of 18.5 to 24.9 is normal.  A BMI of 25 to 29.9 is considered overweight.  A BMI of 30 and above is considered obese.  Maintain normal blood lipids and cholesterol levels by exercising and minimizing your intake of saturated fat. Eat a balanced diet with plenty of fruit and vegetables. Blood tests for lipids and cholesterol should begin at age 63 and be repeated every 5 years. If your lipid or cholesterol levels are high, you are over 50, or you are at high risk for heart disease, you may need your cholesterol levels checked more frequently.Ongoing high lipid and cholesterol levels should be treated with medicines if diet and exercise are not effective.  If you smoke, find out from your caregiver how to quit. If you do not use tobacco, do not start.  If you are pregnant, do not drink alcohol. If you are breastfeeding, be very cautious about drinking alcohol. If you are not pregnant and choose to drink alcohol, do not exceed 1 drink per day. One drink is considered to be 12 ounces (355 mL) of beer, 5 ounces (148 mL) of wine, or 1.5 ounces (44 mL) of liquor.  Avoid use of street drugs. Do not share needles with anyone. Ask for help if you need support or instructions about stopping the use of drugs.  High blood pressure causes heart  disease and increases the risk of stroke. Your blood pressure should be checked at least every 1 to 2 years. Ongoing high blood pressure should be treated with medicines if weight loss and exercise are not effective.  If you are 68 to 63 years old, ask your caregiver if you should take aspirin to prevent strokes.  Diabetes screening involves taking a blood sample to check your fasting blood sugar level. This should be done once every 3 years, after age 3, if you are within normal weight and without risk factors for diabetes. Testing should be considered at a younger age or be carried out more frequently if you are overweight and have at least 1 risk factor for diabetes.  Breast cancer screening is essential preventive care for women. You should practice "breast self-awareness." This means understanding the normal appearance and feel of your breasts and may include breast self-examination. Any changes detected, no matter how small, should be reported to a caregiver. Women in their 21s and 30s should have a clinical breast exam (CBE) by a caregiver as part of a regular health exam every 1 to 3 years. After age 33, women should have a CBE every year. Starting at age 45, women should consider having a mammography (breast X-ray test) every year. Women who have a family history of breast cancer should talk to their caregiver about genetic screening. Women at a high risk of breast cancer should talk to their caregivers about having magnetic resonance imaging (MRI) and a mammography every year.  The Pap test is a screening test for cervical cancer. A Pap test can show cell changes on the cervix that might become cervical cancer if left untreated. A Pap test is a procedure in which cells are obtained and examined from the lower end of the uterus (cervix).  Women should have a Pap test starting at age 2.  Between ages 60 and 13, Pap tests should be repeated every 2 years.  Beginning at age 22, you should have  a Pap test every 3 years as long as the past 3 Pap tests have been normal.  Some women have medical problems that increase the chance of getting cervical cancer. Talk to your caregiver about these problems. It is especially important to talk to your caregiver if a new problem develops soon after your last Pap test. In these cases, your caregiver may recommend more frequent screening and Pap tests.  The above recommendations are the same for women who have or have not gotten the vaccine for human papillomavirus (HPV).  If you had a hysterectomy for a problem that was not cancer or a condition that could lead to cancer, then you no longer need Pap tests. Even if you no longer need a Pap test, a regular exam is a good idea to make sure no other problems are starting.  If you are between ages 35 and 56, and you have had normal Pap tests going back 10 years, you no longer need Pap tests. Even if you no longer need a Pap test, a regular exam is a good idea to make sure no other problems are starting.  If you have had past treatment for cervical cancer or a condition that could lead to cancer, you need Pap tests and screening for cancer for at least  20 years after your treatment.  If Pap tests have been discontinued, risk factors (such as a new sexual partner) need to be reassessed to determine if screening should be resumed.  The HPV test is an additional test that may be used for cervical cancer screening. The HPV test looks for the virus that can cause the cell changes on the cervix. The cells collected during the Pap test can be tested for HPV. The HPV test could be used to screen women aged 17 years and older, and should be used in women of any age who have unclear Pap test results. After the age of 23, women should have HPV testing at the same frequency as a Pap test.  Colorectal cancer can be detected and often prevented. Most routine colorectal cancer screening begins at the age of 42 and continues  through age 32. However, your caregiver may recommend screening at an earlier age if you have risk factors for colon cancer. On a yearly basis, your caregiver may provide home test kits to check for hidden blood in the stool. Use of a small camera at the end of a tube, to directly examine the colon (sigmoidoscopy or colonoscopy), can detect the earliest forms of colorectal cancer. Talk to your caregiver about this at age 89, when routine screening begins. Direct examination of the colon should be repeated every 5 to 10 years through age 59, unless early forms of pre-cancerous polyps or small growths are found.  Hepatitis C blood testing is recommended for all people born from [birth year] through 1965 and any individual with known risks for hepatitis C.  Practice safe sex. Use condoms and avoid high-risk sexual practices to reduce the spread of sexually transmitted infections (STIs). STIs include gonorrhea, chlamydia, syphilis, trichomonas, herpes, HPV, and human immunodeficiency virus (HIV). Herpes, HIV, and HPV are viral illnesses that have no cure. They can result in disability, cancer, and death. Sexually active women aged 100 and younger should be checked for chlamydia. Older women with new or multiple partners should also be tested for chlamydia. Testing for other STIs is recommended if you are sexually active and at increased risk.  Osteoporosis is a disease in which the bones lose minerals and strength with aging. This can result in serious bone fractures. The risk of osteoporosis can be identified using a bone density scan. Women ages 40 and over and women at risk for fractures or osteoporosis should discuss screening with their caregivers. Ask your caregiver whether you should take a calcium supplement or vitamin D to reduce the rate of osteoporosis.  Menopause can be associated with physical symptoms and risks. Hormone replacement therapy is available to decrease symptoms and risks. You should talk to  your caregiver about whether hormone replacement therapy is right for you.  Use sunscreen with sun protection factor (SPF) of 30 or more. Apply sunscreen liberally and repeatedly throughout the day. You should seek shade when your shadow is shorter than you. Protect yourself by wearing long sleeves, pants, a wide-brimmed hat, and sunglasses year round, whenever you are outdoors.  Once a month, do a whole body skin exam, using a mirror to look at the skin on your back. Notify your caregiver of new moles, moles that have irregular borders, moles that are larger than a pencil eraser, or moles that have changed in shape or color.  Stay current with required immunizations.  Influenza. You need a dose every fall (or winter). The composition of the flu vaccine changes each year, so  being vaccinated once is not enough.  Pneumococcal polysaccharide. You need 1 to 2 doses if you smoke cigarettes or if you have certain chronic medical conditions. You need 1 dose at age 1 (or older) if you have never been vaccinated.  Tetanus, diphtheria, pertussis (Tdap, Td). Get 1 dose of Tdap vaccine if you are younger than age 32, are over 49 and have contact with an infant, are a Research scientist (physical sciences), are pregnant, or simply want to be protected from whooping cough. After that, you need a Td booster dose every 10 years. Consult your caregiver if you have not had at least 3 tetanus and diphtheria-containing shots sometime in your life or have a deep or dirty wound.  HPV. You need this vaccine if you are a woman age 49 or younger. The vaccine is given in 3 doses over 6 months.  Measles, mumps, rubella (MMR). You need at least 1 dose of MMR if you were born in 1957 or later. You may also need a second dose.  Meningococcal. If you are age 21 to 71 and a first-year college student living in a residence hall, or have one of several medical conditions, you need to get vaccinated against meningococcal disease. You may also need  additional booster doses.  Zoster (shingles). If you are age 69 or older, you should get this vaccine.  Varicella (chickenpox). If you have never had chickenpox or you were vaccinated but received only 1 dose, talk to your caregiver to find out if you need this vaccine.  Hepatitis A. You need this vaccine if you have a specific risk factor for hepatitis A virus infection or you simply wish to be protected from this disease. The vaccine is usually given as 2 doses, 6 to 18 months apart.  Hepatitis B. You need this vaccine if you have a specific risk factor for hepatitis B virus infection or you simply wish to be protected from this disease. The vaccine is given in 3 doses, usually over 6 months. Preventive Services / Frequency Ages 60 to 24  Blood pressure check.** / Every 1 to 2 years.  Lipid and cholesterol check.** / Every 5 years beginning at age 55.  Clinical breast exam.** / Every 3 years for women in their 61s and 30s.  Pap test.** / Every 2 years from ages 40 through 39. Every 3 years starting at age 71 through age 89 or 30 with a history of 3 consecutive normal Pap tests.  HPV screening.** / Every 3 years from ages 65 through ages 61 to 14 with a history of 3 consecutive normal Pap tests.  Hepatitis C blood test.** / For any individual with known risks for hepatitis C.  Skin self-exam. / Monthly.  Influenza immunization.** / Every year.  Pneumococcal polysaccharide immunization.** / 1 to 2 doses if you smoke cigarettes or if you have certain chronic medical conditions.  Tetanus, diphtheria, pertussis (Tdap, Td) immunization. / A one-time dose of Tdap vaccine. After that, you need a Td booster dose every 10 years.  HPV immunization. / 3 doses over 6 months, if you are 14 and younger.  Measles, mumps, rubella (MMR) immunization. / You need at least 1 dose of MMR if you were born in 1957 or later. You may also need a second dose.  Meningococcal immunization. / 1 dose if you  are age 74 to 63 and a first-year college student living in a residence hall, or have one of several medical conditions, you need to get vaccinated  against meningococcal disease. You may also need additional booster doses.  Varicella immunization.** / Consult your caregiver.  Hepatitis A immunization.** / Consult your caregiver. 2 doses, 6 to 18 months apart.  Hepatitis B immunization.** / Consult your caregiver. 3 doses usually over 6 months. Ages 34 to 63  Blood pressure check.** / Every 1 to 2 years.  Lipid and cholesterol check.** / Every 5 years beginning at age 68.  Clinical breast exam.** / Every year after age 31.  Mammogram.** / Every year beginning at age 55 and continuing for as long as you are in good health. Consult with your caregiver.  Pap test.** / Every 3 years starting at age 75 through age 43 or 11 with a history of 3 consecutive normal Pap tests.  HPV screening.** / Every 3 years from ages 50 through ages 25 to 91 with a history of 3 consecutive normal Pap tests.  Fecal occult blood test (FOBT) of stool. / Every year beginning at age 35 and continuing until age 39. You may not need to do this test if you get a colonoscopy every 10 years.  Flexible sigmoidoscopy or colonoscopy.** / Every 5 years for a flexible sigmoidoscopy or every 10 years for a colonoscopy beginning at age 45 and continuing until age 73.  Hepatitis C blood test.** / For all people born from [birth year] through 1965 and any individual with known risks for hepatitis C.  Skin self-exam. / Monthly.  Influenza immunization.** / Every year.  Pneumococcal polysaccharide immunization.** / 1 to 2 doses if you smoke cigarettes or if you have certain chronic medical conditions.  Tetanus, diphtheria, pertussis (Tdap, Td) immunization.** / A one-time dose of Tdap vaccine. After that, you need a Td booster dose every 10 years.  Measles, mumps, rubella (MMR) immunization. / You need at least 1 dose of MMR if you  were born in 1957 or later. You may also need a second dose.  Varicella immunization.** / Consult your caregiver.  Meningococcal immunization.** / Consult your caregiver.  Hepatitis A immunization.** / Consult your caregiver. 2 doses, 6 to 18 months apart.  Hepatitis B immunization.** / Consult your caregiver. 3 doses, usually over 6 months. Ages 64 and over  Blood pressure check.** / Every 1 to 2 years.  Lipid and cholesterol check.** / Every 5 years beginning at age 78.  Clinical breast exam.** / Every year after age 69.  Mammogram.** / Every year beginning at age 32 and continuing for as long as you are in good health. Consult with your caregiver.  Pap test.** / Every 3 years starting at age 89 through age 31 or 74 with a 3 consecutive normal Pap tests. Testing can be stopped between 65 and 70 with 3 consecutive normal Pap tests and no abnormal Pap or HPV tests in the past 10 years.  HPV screening.** / Every 3 years from ages 23 through ages 27 or 45 with a history of 3 consecutive normal Pap tests. Testing can be stopped between 65 and 70 with 3 consecutive normal Pap tests and no abnormal Pap or HPV tests in the past 10 years.  Fecal occult blood test (FOBT) of stool. / Every year beginning at age 30 and continuing until age 55. You may not need to do this test if you get a colonoscopy every 10 years.  Flexible sigmoidoscopy or colonoscopy.** / Every 5 years for a flexible sigmoidoscopy or every 10 years for a colonoscopy beginning at age 51 and continuing until age 66.  Hepatitis C blood test.** / For all people born from [birth year] through 1966 and any individual with known risks for hepatitis C.  Osteoporosis screening.** / A one-time screening for women ages 51 and over and women at risk for fractures or osteoporosis.  Skin self-exam. / Monthly.  Influenza immunization.** / Every year.  Pneumococcal polysaccharide immunization.** / 1 dose at age 48 (or older) if you have never  been vaccinated.  Tetanus, diphtheria, pertussis (Tdap, Td) immunization. / A one-time dose of Tdap vaccine if you are over 65 and have contact with an infant, are a Research scientist (physical sciences), or simply want to be protected from whooping cough. After that, you need a Td booster dose every 10 years.  Varicella immunization.** / Consult your caregiver.  Meningococcal immunization.** / Consult your caregiver.  Hepatitis A immunization.** / Consult your caregiver. 2 doses, 6 to 18 months apart.  Hepatitis B immunization.** / Check with your caregiver. 3 doses, usually over 6 months. ** Family history and personal history of risk and conditions may change your caregiver's recommendations. Document Released: 08/26/2001 Document Revised: 09/22/2011 Document Reviewed: 11/25/2010 [facility] Patient Information 2013 [employer], [place]. Headaches, Frequently Asked Questions MIGRAINE HEADACHES Q: What is migraine? What causes it? How can I treat it? A: Generally, migraine headaches begin as a dull ache. Then they develop into a constant, throbbing, and pulsating pain. You may experience pain at the temples. You may experience pain at the front or back of one or both sides of the head. The pain is usually accompanied by a combination of:  Nausea.  Vomiting.  Sensitivity to light and noise. Some people (about 15%) experience an aura (see below) before an attack. The cause of migraine is believed to be chemical reactions in the brain. Treatment for migraine may include over-the-counter or prescription medications. It may also include self-help techniques. These include relaxation training and biofeedback.  Q: What is an aura? A: About 15% of people with migraine get an "aura". This is a sign of neurological symptoms that occur before a migraine headache. You may see wavy or jagged lines, dots, or flashing lights. You might experience tunnel vision or blind spots in one or both eyes. The aura can include visual or  auditory hallucinations (something imagined). It may include disruptions in smell (such as strange odors), taste or touch. Other symptoms include:  Numbness.  A "pins and needles" sensation.  Difficulty in recalling or speaking the correct word. These neurological events may last as long as 60 minutes. These symptoms will fade as the headache begins. Q: What is a trigger? A: Certain physical or environmental factors can lead to or "trigger" a migraine. These include:  Foods.  Hormonal changes.  Weather.  Stress. It is important to remember that triggers are different for everyone. To help prevent migraine attacks, you need to figure out which triggers affect you. Keep a headache diary. This is a good way to track triggers. The diary will help you talk to your healthcare professional about your condition. Q: Does weather affect migraines? A: Bright sunshine, hot, humid conditions, and drastic changes in barometric pressure may lead to, or "trigger," a migraine attack in some people. But studies have shown that weather does not act as a trigger for everyone with migraines. Q: What is the link between migraine and hormones? A: Hormones start and regulate many of your body's functions. Hormones keep your body in balance within a constantly changing environment. The levels of hormones in your body are unbalanced at  times. Examples are during menstruation, pregnancy, or menopause. That can lead to a migraine attack. In fact, about three quarters of all women with migraine report that their attacks are related to the menstrual cycle.  Q: Is there an increased risk of stroke for migraine sufferers? A: The likelihood of a migraine attack causing a stroke is very remote. That is not to say that migraine sufferers cannot have a stroke associated with their migraines. In persons under age 35, the most common associated factor for stroke is migraine headache. But over the course of a person's normal life  span, the occurrence of migraine headache may actually be associated with a reduced risk of dying from cerebrovascular disease due to stroke.  Q: What are acute medications for migraine? A: Acute medications are used to treat the pain of the headache after it has started. Examples over-the-counter medications, NSAIDs, ergots, and triptans.  Q: What are the triptans? A: Triptans are the newest class of abortive medications. They are specifically targeted to treat migraine. Triptans are vasoconstrictors. They moderate some chemical reactions in the brain. The triptans work on receptors in your brain. Triptans help to restore the balance of a neurotransmitter called serotonin. Fluctuations in levels of serotonin are thought to be a main cause of migraine.  Q: Are over-the-counter medications for migraine effective? A: Over-the-counter, or "OTC," medications may be effective in relieving mild to moderate pain and associated symptoms of migraine. But you should see your caregiver before beginning any treatment regimen for migraine.  Q: What are preventive medications for migraine? A: Preventive medications for migraine are sometimes referred to as "prophylactic" treatments. They are used to reduce the frequency, severity, and length of migraine attacks. Examples of preventive medications include antiepileptic medications, antidepressants, beta-blockers, calcium channel blockers, and NSAIDs (nonsteroidal anti-inflammatory drugs). Q: Why are anticonvulsants used to treat migraine? A: During the past few years, there has been an increased interest in antiepileptic drugs for the prevention of migraine. They are sometimes referred to as "anticonvulsants". Both epilepsy and migraine may be caused by similar reactions in the brain.  Q: Why are antidepressants used to treat migraine? A: Antidepressants are typically used to treat people with depression. They may reduce migraine frequency by regulating chemical  levels, such as serotonin, in the brain.  Q: What alternative therapies are used to treat migraine? A: The term "alternative therapies" is often used to describe treatments considered outside the scope of conventional Western medicine. Examples of alternative therapy include acupuncture, acupressure, and yoga. Another common alternative treatment is herbal therapy. Some herbs are believed to relieve headache pain. Always discuss alternative therapies with your caregiver before proceeding. Some herbal products contain arsenic and other toxins. TENSION HEADACHES Q: What is a tension-type headache? What causes it? How can I treat it? A: Tension-type headaches occur randomly. They are often the result of temporary stress, anxiety, fatigue, or anger. Symptoms include soreness in your temples, a tightening band-like sensation around your head (a "vice-like" ache). Symptoms can also include a pulling feeling, pressure sensations, and contracting head and neck muscles. The headache begins in your forehead, temples, or the back of your head and neck. Treatment for tension-type headache may include over-the-counter or prescription medications. Treatment may also include self-help techniques such as relaxation training and biofeedback. CLUSTER HEADACHES Q: What is a cluster headache? What causes it? How can I treat it? A: Cluster headache gets its name because the attacks come in groups. The pain arrives with little, if any, warning.  It is usually on one side of the head. A tearing or bloodshot eye and a runny nose on the same side of the headache may also accompany the pain. Cluster headaches are believed to be caused by chemical reactions in the brain. They have been described as the most severe and intense of any headache type. Treatment for cluster headache includes prescription medication and oxygen. SINUS HEADACHES Q: What is a sinus headache? What causes it? How can I treat it? A: When a cavity in the bones  of the face and skull (a sinus) becomes inflamed, the inflammation will cause localized pain. This condition is usually the result of an allergic reaction, a tumor, or an infection. If your headache is caused by a sinus blockage, such as an infection, you will probably have a fever. An x-ray will confirm a sinus blockage. Your caregiver's treatment might include antibiotics for the infection, as well as antihistamines or decongestants.  REBOUND HEADACHES Q: What is a rebound headache? What causes it? How can I treat it? A: A pattern of taking acute headache medications too often can lead to a condition known as "rebound headache." A pattern of taking too much headache medication includes taking it more than 2 days per week or in excessive amounts. That means more than the label or a caregiver advises. With rebound headaches, your medications not only stop relieving pain, they actually begin to cause headaches. Doctors treat rebound headache by tapering the medication that is being overused. Sometimes your caregiver will gradually substitute a different type of treatment or medication. Stopping may be a challenge. Regularly overusing a medication increases the potential for serious side effects. Consult a caregiver if you regularly use headache medications more than 2 days per week or more than the label advises. ADDITIONAL QUESTIONS AND ANSWERS Q: What is biofeedback? A: Biofeedback is a self-help treatment. Biofeedback uses special equipment to monitor your body's involuntary physical responses. Biofeedback monitors:  Breathing.  Pulse.  Heart rate.  Temperature.  Muscle tension.  Brain activity. Biofeedback helps you refine and perfect your relaxation exercises. You learn to control the physical responses that are related to stress. Once the technique has been mastered, you do not need the equipment any more. Q: Are headaches hereditary? A: Four out of five (80%) of people that suffer report a  family history of migraine. Scientists are not sure if this is genetic or a family predisposition. Despite the uncertainty, a child has a 50% chance of having migraine if one parent suffers. The child has a 75% chance if both parents suffer.  Q: Can children get headaches? A: By the time they reach high school, most young people have experienced some type of headache. Many safe and effective approaches or medications can prevent a headache from occurring or stop it after it has begun.  Q: What type of doctor should I see to diagnose and treat my headache? A: Start with your primary caregiver. Discuss his or her experience and approach to headaches. Discuss methods of classification, diagnosis, and treatment. Your caregiver may decide to recommend you to a headache specialist, depending upon your symptoms or other physical conditions. Having diabetes, allergies, etc., may require a more comprehensive and inclusive approach to your headache. The [employer] will provide, upon request, a list of [facility] physician members in your state. Document Released: 09/20/2003 Document Revised: 09/22/2011 Document Reviewed: 02/28/2008 [facility] Patient Information 2013 [place], [place].     Neta Mends. Elie Gragert M.D.

## 2012-08-09 NOTE — Patient Instructions (Addendum)
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    Then if needed may get neurology about this finding and headaches although our exam is good today.  The back discomfort seems to be mechanical i nature and  Good back hygiene may be helpful .    We can get a chest x ray to be sure no lung issues but your exam is normal.  The potassium issue may be a lab artifact we can repeat this test today  alos with thyroid test to be sure ok. Can use otc med for sea sickness if needed   Can get shingles vaccine at any time . Mammogram at least every 2 years  Or less. Will let you know if any other intervention needed.  If doing well preventive visit in a year  . If having issues with headaches then ROV  Earlier.  Preventive Care for Adults, Female A healthy lifestyle and preventive care can promote health and wellness. Preventive health guidelines for women include the following key practices.  A routine yearly physical is a good way to check with your caregiver about your health and preventive screening. It is a chance to share any concerns and updates on your health, and to receive a thorough exam.  Visit your dentist for a routine exam and preventive care every 6 months. Brush your teeth twice a day and floss once a day. Good oral hygiene prevents tooth decay and gum disease.  The frequency of eye exams is based on your age, health, family medical history, use of contact lenses, and other factors. Follow your caregiver's recommendations for frequency of eye exams.  Eat a healthy diet. Foods like vegetables, fruits, whole grains, low-fat dairy products, and lean protein foods contain the nutrients you need without too many calories. Decrease your intake of foods high in solid fats, added sugars, and salt. Eat the right amount of calories for you.Get information about a proper diet from your caregiver, if necessary.  Regular physical exercise  is one of the most important things you can do for your health. Most adults should get at least 150 minutes of moderate-intensity exercise (any activity that increases your heart rate and causes you to sweat) each week. In addition, most adults need muscle-strengthening exercises on 2 or more days a week.  Maintain a healthy weight. The body mass index (BMI) is a screening tool to identify possible weight problems. It provides an estimate of body fat based on height and weight. Your caregiver can help determine your BMI, and can help you achieve or maintain a healthy weight.For adults 20 years and older:  A BMI below 18.5 is considered underweight.  A BMI of 18.5 to 24.9 is normal.  A BMI of 25 to 29.9 is considered overweight.  A BMI of 30 and above is considered obese.  Maintain normal blood lipids and cholesterol levels by exercising and minimizing your intake of saturated fat. Eat a balanced diet with plenty of fruit and vegetables. Blood tests for lipids and cholesterol should begin at age 24 and be repeated every 5 years. If your lipid or cholesterol levels are high, you are over 50, or you are at high risk for heart disease, you may need your cholesterol levels checked more frequently.Ongoing high lipid and cholesterol levels should be treated with medicines if diet and exercise are not effective.  If you smoke, find out from your  caregiver how to quit. If you do not use tobacco, do not start.  If you are pregnant, do not drink alcohol. If you are breastfeeding, be very cautious about drinking alcohol. If you are not pregnant and choose to drink alcohol, do not exceed 1 drink per day. One drink is considered to be 12 ounces (355 mL) of beer, 5 ounces (148 mL) of wine, or 1.5 ounces (44 mL) of liquor.  Avoid use of street drugs. Do not share needles with anyone. Ask for help if you need support or instructions about stopping the use of drugs.  High blood pressure causes heart disease and  increases the risk of stroke. Your blood pressure should be checked at least every 1 to 2 years. Ongoing high blood pressure should be treated with medicines if weight loss and exercise are not effective.  If you are 50 to 63 years old, ask your caregiver if you should take aspirin to prevent strokes.  Diabetes screening involves taking a blood sample to check your fasting blood sugar level. This should be done once every 3 years, after age 59, if you are within normal weight and without risk factors for diabetes. Testing should be considered at a younger age or be carried out more frequently if you are overweight and have at least 1 risk factor for diabetes.  Breast cancer screening is essential preventive care for women. You should practice "breast self-awareness." This means understanding the normal appearance and feel of your breasts and may include breast self-examination. Any changes detected, no matter how small, should be reported to a caregiver. Women in their 21s and 30s should have a clinical breast exam (CBE) by a caregiver as part of a regular health exam every 1 to 3 years. After age 11, women should have a CBE every year. Starting at age 27, women should consider having a mammography (breast X-ray test) every year. Women who have a family history of breast cancer should talk to their caregiver about genetic screening. Women at a high risk of breast cancer should talk to their caregivers about having magnetic resonance imaging (MRI) and a mammography every year.  The Pap test is a screening test for cervical cancer. A Pap test can show cell changes on the cervix that might become cervical cancer if left untreated. A Pap test is a procedure in which cells are obtained and examined from the lower end of the uterus (cervix).  Women should have a Pap test starting at age 33.  Between ages 79 and 15, Pap tests should be repeated every 2 years.  Beginning at age 27, you should have a Pap test  every 3 years as long as the past 3 Pap tests have been normal.  Some women have medical problems that increase the chance of getting cervical cancer. Talk to your caregiver about these problems. It is especially important to talk to your caregiver if a new problem develops soon after your last Pap test. In these cases, your caregiver may recommend more frequent screening and Pap tests.  The above recommendations are the same for women who have or have not gotten the vaccine for human papillomavirus (HPV).  If you had a hysterectomy for a problem that was not cancer or a condition that could lead to cancer, then you no longer need Pap tests. Even if you no longer need a Pap test, a regular exam is a good idea to make sure no other problems are starting.  If you are  between ages 4 and 19, and you have had normal Pap tests going back 10 years, you no longer need Pap tests. Even if you no longer need a Pap test, a regular exam is a good idea to make sure no other problems are starting.  If you have had past treatment for cervical cancer or a condition that could lead to cancer, you need Pap tests and screening for cancer for at least 20 years after your treatment.  If Pap tests have been discontinued, risk factors (such as a new sexual partner) need to be reassessed to determine if screening should be resumed.  The HPV test is an additional test that may be used for cervical cancer screening. The HPV test looks for the virus that can cause the cell changes on the cervix. The cells collected during the Pap test can be tested for HPV. The HPV test could be used to screen women aged 17 years and older, and should be used in women of any age who have unclear Pap test results. After the age of 53, women should have HPV testing at the same frequency as a Pap test.  Colorectal cancer can be detected and often prevented. Most routine colorectal cancer screening begins at the age of 60 and continues through age  91. However, your caregiver may recommend screening at an earlier age if you have risk factors for colon cancer. On a yearly basis, your caregiver may provide home test kits to check for hidden blood in the stool. Use of a small camera at the end of a tube, to directly examine the colon (sigmoidoscopy or colonoscopy), can detect the earliest forms of colorectal cancer. Talk to your caregiver about this at age 72, when routine screening begins. Direct examination of the colon should be repeated every 5 to 10 years through age 35, unless early forms of pre-cancerous polyps or small growths are found.  Hepatitis C blood testing is recommended for all people born from [birth year] through 1965 and any individual with known risks for hepatitis C.  Practice safe sex. Use condoms and avoid high-risk sexual practices to reduce the spread of sexually transmitted infections (STIs). STIs include gonorrhea, chlamydia, syphilis, trichomonas, herpes, HPV, and human immunodeficiency virus (HIV). Herpes, HIV, and HPV are viral illnesses that have no cure. They can result in disability, cancer, and death. Sexually active women aged 64 and younger should be checked for chlamydia. Older women with new or multiple partners should also be tested for chlamydia. Testing for other STIs is recommended if you are sexually active and at increased risk.  Osteoporosis is a disease in which the bones lose minerals and strength with aging. This can result in serious bone fractures. The risk of osteoporosis can be identified using a bone density scan. Women ages 44 and over and women at risk for fractures or osteoporosis should discuss screening with their caregivers. Ask your caregiver whether you should take a calcium supplement or vitamin D to reduce the rate of osteoporosis.  Menopause can be associated with physical symptoms and risks. Hormone replacement therapy is available to decrease symptoms and risks. You should talk to your caregiver  about whether hormone replacement therapy is right for you.  Use sunscreen with sun protection factor (SPF) of 30 or more. Apply sunscreen liberally and repeatedly throughout the day. You should seek shade when your shadow is shorter than you. Protect yourself by wearing long sleeves, pants, a wide-brimmed hat, and sunglasses year round, whenever you are outdoors.  Once a month, do a whole body skin exam, using a mirror to look at the skin on your back. Notify your caregiver of new moles, moles that have irregular borders, moles that are larger than a pencil eraser, or moles that have changed in shape or color.  Stay current with required immunizations.  Influenza. You need a dose every fall (or winter). The composition of the flu vaccine changes each year, so being vaccinated once is not enough.  Pneumococcal polysaccharide. You need 1 to 2 doses if you smoke cigarettes or if you have certain chronic medical conditions. You need 1 dose at age 74 (or older) if you have never been vaccinated.  Tetanus, diphtheria, pertussis (Tdap, Td). Get 1 dose of Tdap vaccine if you are younger than age 47, are over 23 and have contact with an infant, are a Research scientist (physical sciences), are pregnant, or simply want to be protected from whooping cough. After that, you need a Td booster dose every 10 years. Consult your caregiver if you have not had at least 3 tetanus and diphtheria-containing shots sometime in your life or have a deep or dirty wound.  HPV. You need this vaccine if you are a woman age 7 or younger. The vaccine is given in 3 doses over 6 months.  Measles, mumps, rubella (MMR). You need at least 1 dose of MMR if you were born in 1957 or later. You may also need a second dose.  Meningococcal. If you are age 8 to 58 and a first-year college student living in a residence hall, or have one of several medical conditions, you need to get vaccinated against meningococcal disease. You may also need additional booster  doses.  Zoster (shingles). If you are age 62 or older, you should get this vaccine.  Varicella (chickenpox). If you have never had chickenpox or you were vaccinated but received only 1 dose, talk to your caregiver to find out if you need this vaccine.  Hepatitis A. You need this vaccine if you have a specific risk factor for hepatitis A virus infection or you simply wish to be protected from this disease. The vaccine is usually given as 2 doses, 6 to 18 months apart.  Hepatitis B. You need this vaccine if you have a specific risk factor for hepatitis B virus infection or you simply wish to be protected from this disease. The vaccine is given in 3 doses, usually over 6 months. Preventive Services / Frequency Ages 80 to 75  Blood pressure check.** / Every 1 to 2 years.  Lipid and cholesterol check.** / Every 5 years beginning at age 21.  Clinical breast exam.** / Every 3 years for women in their 64s and 30s.  Pap test.** / Every 2 years from ages 15 through 6. Every 3 years starting at age 49 through age 68 or 3 with a history of 3 consecutive normal Pap tests.  HPV screening.** / Every 3 years from ages 13 through ages 44 to 65 with a history of 3 consecutive normal Pap tests.  Hepatitis C blood test.** / For any individual with known risks for hepatitis C.  Skin self-exam. / Monthly.  Influenza immunization.** / Every year.  Pneumococcal polysaccharide immunization.** / 1 to 2 doses if you smoke cigarettes or if you have certain chronic medical conditions.  Tetanus, diphtheria, pertussis (Tdap, Td) immunization. / A one-time dose of Tdap vaccine. After that, you need a Td booster dose every 10 years.  HPV immunization. / 3 doses over  6 months, if you are 50 and younger.  Measles, mumps, rubella (MMR) immunization. / You need at least 1 dose of MMR if you were born in 1957 or later. You may also need a second dose.  Meningococcal immunization. / 1 dose if you are age 67 to 4 and  a first-year college student living in a residence hall, or have one of several medical conditions, you need to get vaccinated against meningococcal disease. You may also need additional booster doses.  Varicella immunization.** / Consult your caregiver.  Hepatitis A immunization.** / Consult your caregiver. 2 doses, 6 to 18 months apart.  Hepatitis B immunization.** / Consult your caregiver. 3 doses usually over 6 months. Ages 66 to 65  Blood pressure check.** / Every 1 to 2 years.  Lipid and cholesterol check.** / Every 5 years beginning at age 83.  Clinical breast exam.** / Every year after age 67.  Mammogram.** / Every year beginning at age 23 and continuing for as long as you are in good health. Consult with your caregiver.  Pap test.** / Every 3 years starting at age 43 through age 44 or 57 with a history of 3 consecutive normal Pap tests.  HPV screening.** / Every 3 years from ages 32 through ages 41 to 85 with a history of 3 consecutive normal Pap tests.  Fecal occult blood test (FOBT) of stool. / Every year beginning at age 51 and continuing until age 50. You may not need to do this test if you get a colonoscopy every 10 years.  Flexible sigmoidoscopy or colonoscopy.** / Every 5 years for a flexible sigmoidoscopy or every 10 years for a colonoscopy beginning at age 18 and continuing until age 5.  Hepatitis C blood test.** / For all people born from [birth year] through 1966 and any individual with known risks for hepatitis C.  Skin self-exam. / Monthly.  Influenza immunization.** / Every year.  Pneumococcal polysaccharide immunization.** / 1 to 2 doses if you smoke cigarettes or if you have certain chronic medical conditions.  Tetanus, diphtheria, pertussis (Tdap, Td) immunization.** / A one-time dose of Tdap vaccine. After that, you need a Td booster dose every 10 years.  Measles, mumps, rubella (MMR) immunization. / You need at least 1 dose of MMR if you were born in 1957 or  later. You may also need a second dose.  Varicella immunization.** / Consult your caregiver.  Meningococcal immunization.** / Consult your caregiver.  Hepatitis A immunization.** / Consult your caregiver. 2 doses, 6 to 18 months apart.  Hepatitis B immunization.** / Consult your caregiver. 3 doses, usually over 6 months. Ages 74 and over  Blood pressure check.** / Every 1 to 2 years.  Lipid and cholesterol check.** / Every 5 years beginning at age 80.  Clinical breast exam.** / Every year after age 5.  Mammogram.** / Every year beginning at age 47 and continuing for as long as you are in good health. Consult with your caregiver.  Pap test.** / Every 3 years starting at age 110 through age 33 or 59 with a 3 consecutive normal Pap tests. Testing can be stopped between 65 and 70 with 3 consecutive normal Pap tests and no abnormal Pap or HPV tests in the past 10 years.  HPV screening.** / Every 3 years from ages 12 through ages 38 or 46 with a history of 3 consecutive normal Pap tests. Testing can be stopped between 65 and 70 with 3 consecutive normal Pap tests and no abnormal  Pap or HPV tests in the past 10 years.  Fecal occult blood test (FOBT) of stool. / Every year beginning at age 84 and continuing until age 88. You may not need to do this test if you get a colonoscopy every 10 years.  Flexible sigmoidoscopy or colonoscopy.** / Every 5 years for a flexible sigmoidoscopy or every 10 years for a colonoscopy beginning at age 39 and continuing until age 37.  Hepatitis C blood test.** / For all people born from [birth year] through 1966 and any individual with known risks for hepatitis C.  Osteoporosis screening.** / A one-time screening for women ages 29 and over and women at risk for fractures or osteoporosis.  Skin self-exam. / Monthly.  Influenza immunization.** / Every year.  Pneumococcal polysaccharide immunization.** / 1 dose at age 49 (or older) if you have never been  vaccinated.  Tetanus, diphtheria, pertussis (Tdap, Td) immunization. / A one-time dose of Tdap vaccine if you are over 65 and have contact with an infant, are a Research scientist (physical sciences), or simply want to be protected from whooping cough. After that, you need a Td booster dose every 10 years.  Varicella immunization.** / Consult your caregiver.  Meningococcal immunization.** / Consult your caregiver.  Hepatitis A immunization.** / Consult your caregiver. 2 doses, 6 to 18 months apart.  Hepatitis B immunization.** / Check with your caregiver. 3 doses, usually over 6 months. ** Family history and personal history of risk and conditions may change your caregiver's recommendations. Document Released: 08/26/2001 Document Revised: 09/22/2011 Document Reviewed: 11/25/2010 [facility] Patient Information 2013 [place], [place]. Headaches, Frequently Asked Questions MIGRAINE HEADACHES Q: What is migraine? What causes it? How can I treat it? A: Generally, migraine headaches begin as a dull ache. Then they develop into a constant, throbbing, and pulsating pain. You may experience pain at the temples. You may experience pain at the front or back of one or both sides of the head. The pain is usually accompanied by a combination of:  Nausea.  Vomiting.  Sensitivity to light and noise. Some people (about 15%) experience an aura (see below) before an attack. The cause of migraine is believed to be chemical reactions in the brain. Treatment for migraine may include over-the-counter or prescription medications. It may also include self-help techniques. These include relaxation training and biofeedback.  Q: What is an aura? A: About 15% of people with migraine get an "aura". This is a sign of neurological symptoms that occur before a migraine headache. You may see wavy or jagged lines, dots, or flashing lights. You might experience tunnel vision or blind spots in one or both eyes. The aura can include visual or auditory  hallucinations (something imagined). It may include disruptions in smell (such as strange odors), taste or touch. Other symptoms include:  Numbness.  A "pins and needles" sensation.  Difficulty in recalling or speaking the correct word. These neurological events may last as long as 60 minutes. These symptoms will fade as the headache begins. Q: What is a trigger? A: Certain physical or environmental factors can lead to or "trigger" a migraine. These include:  Foods.  Hormonal changes.  Weather.  Stress. It is important to remember that triggers are different for everyone. To help prevent migraine attacks, you need to figure out which triggers affect you. Keep a headache diary. This is a good way to track triggers. The diary will help you talk to your healthcare professional about your condition. Q: Does weather affect migraines? A: Bright sunshine, hot,  humid conditions, and drastic changes in barometric pressure may lead to, or "trigger," a migraine attack in some people. But studies have shown that weather does not act as a trigger for everyone with migraines. Q: What is the link between migraine and hormones? A: Hormones start and regulate many of your body's functions. Hormones keep your body in balance within a constantly changing environment. The levels of hormones in your body are unbalanced at times. Examples are during menstruation, pregnancy, or menopause. That can lead to a migraine attack. In fact, about three quarters of all women with migraine report that their attacks are related to the menstrual cycle.  Q: Is there an increased risk of stroke for migraine sufferers? A: The likelihood of a migraine attack causing a stroke is very remote. That is not to say that migraine sufferers cannot have a stroke associated with their migraines. In persons under age 66, the most common associated factor for stroke is migraine headache. But over the course of a person's normal life span, the  occurrence of migraine headache may actually be associated with a reduced risk of dying from cerebrovascular disease due to stroke.  Q: What are acute medications for migraine? A: Acute medications are used to treat the pain of the headache after it has started. Examples over-the-counter medications, NSAIDs, ergots, and triptans.  Q: What are the triptans? A: Triptans are the newest class of abortive medications. They are specifically targeted to treat migraine. Triptans are vasoconstrictors. They moderate some chemical reactions in the brain. The triptans work on receptors in your brain. Triptans help to restore the balance of a neurotransmitter called serotonin. Fluctuations in levels of serotonin are thought to be a main cause of migraine.  Q: Are over-the-counter medications for migraine effective? A: Over-the-counter, or "OTC," medications may be effective in relieving mild to moderate pain and associated symptoms of migraine. But you should see your caregiver before beginning any treatment regimen for migraine.  Q: What are preventive medications for migraine? A: Preventive medications for migraine are sometimes referred to as "prophylactic" treatments. They are used to reduce the frequency, severity, and length of migraine attacks. Examples of preventive medications include antiepileptic medications, antidepressants, beta-blockers, calcium channel blockers, and NSAIDs (nonsteroidal anti-inflammatory drugs). Q: Why are anticonvulsants used to treat migraine? A: During the past few years, there has been an increased interest in antiepileptic drugs for the prevention of migraine. They are sometimes referred to as "anticonvulsants". Both epilepsy and migraine may be caused by similar reactions in the brain.  Q: Why are antidepressants used to treat migraine? A: Antidepressants are typically used to treat people with depression. They may reduce migraine frequency by regulating chemical levels, such as  serotonin, in the brain.  Q: What alternative therapies are used to treat migraine? A: The term "alternative therapies" is often used to describe treatments considered outside the scope of conventional Western medicine. Examples of alternative therapy include acupuncture, acupressure, and yoga. Another common alternative treatment is herbal therapy. Some herbs are believed to relieve headache pain. Always discuss alternative therapies with your caregiver before proceeding. Some herbal products contain arsenic and other toxins. TENSION HEADACHES Q: What is a tension-type headache? What causes it? How can I treat it? A: Tension-type headaches occur randomly. They are often the result of temporary stress, anxiety, fatigue, or anger. Symptoms include soreness in your temples, a tightening band-like sensation around your head (a "vice-like" ache). Symptoms can also include a pulling feeling, pressure sensations, and contracting head and  neck muscles. The headache begins in your forehead, temples, or the back of your head and neck. Treatment for tension-type headache may include over-the-counter or prescription medications. Treatment may also include self-help techniques such as relaxation training and biofeedback. CLUSTER HEADACHES Q: What is a cluster headache? What causes it? How can I treat it? A: Cluster headache gets its name because the attacks come in groups. The pain arrives with little, if any, warning. It is usually on one side of the head. A tearing or bloodshot eye and a runny nose on the same side of the headache may also accompany the pain. Cluster headaches are believed to be caused by chemical reactions in the brain. They have been described as the most severe and intense of any headache type. Treatment for cluster headache includes prescription medication and oxygen. SINUS HEADACHES Q: What is a sinus headache? What causes it? How can I treat it? A: When a cavity in the bones of the face and  skull (a sinus) becomes inflamed, the inflammation will cause localized pain. This condition is usually the result of an allergic reaction, a tumor, or an infection. If your headache is caused by a sinus blockage, such as an infection, you will probably have a fever. An x-ray will confirm a sinus blockage. Your caregiver's treatment might include antibiotics for the infection, as well as antihistamines or decongestants.  REBOUND HEADACHES Q: What is a rebound headache? What causes it? How can I treat it? A: A pattern of taking acute headache medications too often can lead to a condition known as "rebound headache." A pattern of taking too much headache medication includes taking it more than 2 days per week or in excessive amounts. That means more than the label or a caregiver advises. With rebound headaches, your medications not only stop relieving pain, they actually begin to cause headaches. Doctors treat rebound headache by tapering the medication that is being overused. Sometimes your caregiver will gradually substitute a different type of treatment or medication. Stopping may be a challenge. Regularly overusing a medication increases the potential for serious side effects. Consult a caregiver if you regularly use headache medications more than 2 days per week or more than the label advises. ADDITIONAL QUESTIONS AND ANSWERS Q: What is biofeedback? A: Biofeedback is a self-help treatment. Biofeedback uses special equipment to monitor your body's involuntary physical responses. Biofeedback monitors:  Breathing.  Pulse.  Heart rate.  Temperature.  Muscle tension.  Brain activity. Biofeedback helps you refine and perfect your relaxation exercises. You learn to control the physical responses that are related to stress. Once the technique has been mastered, you do not need the equipment any more. Q: Are headaches hereditary? A: Four out of five (80%) of people that suffer report a family history  of migraine. Scientists are not sure if this is genetic or a family predisposition. Despite the uncertainty, a child has a 50% chance of having migraine if one parent suffers. The child has a 75% chance if both parents suffer.  Q: Can children get headaches? A: By the time they reach high school, most young people have experienced some type of headache. Many safe and effective approaches or medications can prevent a headache from occurring or stop it after it has begun.  Q: What type of doctor should I see to diagnose and treat my headache? A: Start with your primary caregiver. Discuss his or her experience and approach to headaches. Discuss methods of classification, diagnosis, and treatment. Your caregiver may  decide to recommend you to a headache specialist, depending upon your symptoms or other physical conditions. Having diabetes, allergies, etc., may require a more comprehensive and inclusive approach to your headache. The National Head[employer] Foundation will provide, upon request, a list of [facility] physician members in your state. Document Released: 09/20/2003 Document Revised: 09/22/2011 Document Reviewed: 02/28/2008 [facility] Patient Information 2013 [place], [place].

## 2012-08-12 ENCOUNTER — Telehealth: Payer: Self-pay | Admitting: Internal Medicine

## 2012-08-12 NOTE — Telephone Encounter (Signed)
[place], [place] [address] p. [phone] f. [phone] To: [facility] (After Hours Triage) Fax: [phone] From: [facility] Date/ Time: 08/11/2012 9:14 AM Taken By: Jethro BolusSamara Deist Facility: home Patient: Jenny [name], Jenny [name] DOB: 29-Jul-1950 Phone: [phone] Reason for Call: Called to request Rx for shingles shot be sent to [facility] in [place]. Insurance does cover it but pharmacy needs Rx to administer. Please call patient to notify when this is done so she can get it done. Regarding Appointment: Appt Date: Appt Time: Unknown Provider: Reason: Details:

## 2012-08-13 ENCOUNTER — Other Ambulatory Visit: Payer: Self-pay | Admitting: *Deleted

## 2012-08-13 MED ORDER — ZOSTER VACCINE LIVE 19400 UNT/0.65ML ~~LOC~~ SOLR: 0.6500 mL | Freq: Once | SUBCUTANEOUS | Status: DC

## 2012-08-13 NOTE — Telephone Encounter (Signed)
Rx done and sent to pharmacy. Pt aware.

## 2012-08-13 NOTE — Telephone Encounter (Signed)
Rx for Zostavax sent to pharmacy. Pt aware Rx done.

## 2012-08-14 DIAGNOSIS — H4720 Unspecified optic atrophy: Secondary | ICD-10-CM | POA: Insufficient documentation

## 2012-08-16 ENCOUNTER — Telehealth: Payer: Self-pay | Admitting: Internal Medicine

## 2012-08-16 NOTE — Telephone Encounter (Signed)
Patient Information: Caller Name: Dasher Phone: [phone] Patient: Jenny [name], Jenny [name] Gender: Female DOB: 02/21/1951 Age: 62 Years PCP: Berniece Andreas (Family Practice)  Office Follow Up: Does the office need to follow up with this patient?: Yes Instructions For The Office: ask if medication can be called in for her? or does she require another OV? RN Note: [facility] on [address]. If medication cannot be called in, please call her for a today appt. She is going on her first cruise Saturday 2/8 and would like to feel better. Symptoms Reason For Call & Symptoms: Had "rice crispy sounds" in her head last week during her well visit and talked to Dr. Lebron Quam about same. Now with a h/a, sore throat, teeth aching on the right, yellow/green nasal drainage. No fever. Reviewed Health History In EMR: Yes Reviewed Medications In EMR: Yes Reviewed Allergies In EMR: Yes Reviewed Surgeries / Procedures: Yes Date of Onset of Symptoms: 08/12/2012 Guideline Used: Sinus Pain and Congestion Disposition Per Guideline:  Home Care Reason For Disposition Reached:  Sinus congestion as part of a cold, present < 10 days Advice Given: N/A

## 2012-08-16 NOTE — Telephone Encounter (Signed)
Please advise: RX vs OV?

## 2012-08-16 NOTE — Telephone Encounter (Signed)
Per [facility], pt should be seen in the office to decided if antibiotic is truly needed.  This could cause GI upset which could be a problem on the cruise.  Please schedule pt.  Thanks!!

## 2012-08-16 NOTE — Telephone Encounter (Signed)
Appt sched.

## 2012-08-17 ENCOUNTER — Encounter: Payer: Self-pay | Admitting: Internal Medicine

## 2012-08-17 ENCOUNTER — Ambulatory Visit (INDEPENDENT_AMBULATORY_CARE_PROVIDER_SITE_OTHER): Payer: BC Managed Care – PPO | Admitting: Internal Medicine

## 2012-08-17 VITALS — BP 120/84 | HR 86 | Temp 97.9°F | Wt 139.0 lb

## 2012-08-17 DIAGNOSIS — E875 Hyperkalemia: Secondary | ICD-10-CM

## 2012-08-17 DIAGNOSIS — R6889 Other general symptoms and signs: Secondary | ICD-10-CM

## 2012-08-17 DIAGNOSIS — J329 Chronic sinusitis, unspecified: Secondary | ICD-10-CM

## 2012-08-17 MED ORDER — AMOXICILLIN-POT CLAVULANATE 875-125 MG PO TABS: 1.0000 | ORAL_TABLET | Freq: Two times a day (BID) | ORAL | Status: DC

## 2012-08-17 NOTE — Patient Instructions (Addendum)
You have a sinusitis   On the right  Cough may continue longer   Decongestants    Warm compresses and help the pressure  And expect improvement in the next 3-5 day s. Lungs are clear at this time   For reflux sx  Would try OTC omeprazole every day for 2 weeks and then see how  You are doing.   Sinusitis Sinusitis is redness, soreness, and swelling (inflammation) of the paranasal sinuses. Paranasal sinuses are air pockets within the bones of your face (beneath the eyes, the middle of the forehead, or above the eyes). In healthy paranasal sinuses, mucus is able to drain out, and air is able to circulate through them by way of your nose. However, when your paranasal sinuses are inflamed, mucus and air can become trapped. This can allow bacteria and other germs to grow and cause infection. Sinusitis can develop quickly and last only a short time (acute) or continue over a long period (chronic). Sinusitis that lasts for more than 12 weeks is considered chronic.  CAUSES  Causes of sinusitis include:  Allergies.  Structural abnormalities, such as displacement of the cartilage that separates your nostrils (deviated septum), which can decrease the air flow through your nose and sinuses and affect sinus drainage.  Functional abnormalities, such as when the small hairs (cilia) that line your sinuses and help remove mucus do not work properly or are not present. SYMPTOMS  Symptoms of acute and chronic sinusitis are the same. The primary symptoms are pain and pressure around the affected sinuses. Other symptoms include:  Upper toothache.  Earache.  Headache.  Bad breath.  Decreased sense of smell and taste.  A cough, which worsens when you are lying flat.  Fatigue.  Fever.  Thick drainage from your nose, which often is green and may contain pus (purulent).  Swelling and warmth over the affected sinuses. DIAGNOSIS  Your caregiver will perform a physical exam. During the exam, your  caregiver may:  Look in your nose for signs of abnormal growths in your nostrils (nasal polyps).  Tap over the affected sinus to check for signs of infection.  View the inside of your sinuses (endoscopy) with a special imaging device with a light attached (endoscope), which is inserted into your sinuses. If your caregiver suspects that you have chronic sinusitis, one or more of the following tests may be recommended:  Allergy tests.  Nasal culture A sample of mucus is taken from your nose and sent to a lab and screened for bacteria.  Nasal cytology A sample of mucus is taken from your nose and examined by your caregiver to determine if your sinusitis is related to an allergy. TREATMENT  Most cases of acute sinusitis are related to a viral infection and will resolve on their own within 10 days. Sometimes medicines are prescribed to help relieve symptoms (pain medicine, decongestants, nasal steroid sprays, or saline sprays).  However, for sinusitis related to a bacterial infection, your caregiver will prescribe antibiotic medicines. These are medicines that will help kill the bacteria causing the infection.  Rarely, sinusitis is caused by a fungal infection. In theses cases, your caregiver will prescribe antifungal medicine. For some cases of chronic sinusitis, surgery is needed. Generally, these are cases in which sinusitis recurs more than 3 times per year, despite other treatments. HOME CARE INSTRUCTIONS   Drink plenty of water. Water helps thin the mucus so your sinuses can drain more easily.  Use a humidifier.  Inhale steam 3 to  4 times a day (for example, sit in the bathroom with the shower running).  Apply a warm, moist washcloth to your face 3 to 4 times a day, or as directed by your caregiver.  Use saline nasal sprays to help moisten and clean your sinuses.  Take over-the-counter or prescription medicines for pain, discomfort, or fever only as directed by your caregiver. SEEK  IMMEDIATE MEDICAL CARE IF:  You have increasing pain or severe headaches.  You have nausea, vomiting, or drowsiness.  You have swelling around your face.  You have vision problems.  You have a stiff neck.  You have difficulty breathing. MAKE SURE YOU:   Understand these instructions.  Will watch your condition.  Will get help right away if you are not doing well or get worse. Document Released: 06/30/2005 Document Revised: 09/22/2011 Document Reviewed: 07/15/2011 [facility] Patient Information 2013 [place], [place]. Title List Changes/Modifications  Diet for Gastroesophageal Reflux Disease, Adult Reflux (acid reflux) is when acid from your stomach flows up into the esophagus. When acid comes in contact with the esophagus, the acid causes irritation and soreness (inflammation) in the esophagus. When reflux happens often or so severely that it causes damage to the esophagus, it is called gastroesophageal reflux disease (GERD). Nutrition therapy can help ease the discomfort of GERD. FOODS OR DRINKS TO AVOID OR LIMIT  Smoking or chewing tobacco. Nicotine is one of the most potent stimulants to acid production in the gastrointestinal tract.  Caffeinated and decaffeinated coffee and black tea.  Regular or low-calorie carbonated beverages or energy drinks (caffeine-free carbonated beverages are allowed).   Strong spices, such as black pepper, white pepper, red pepper, cayenne, curry powder, and chili powder.  Peppermint or spearmint.  Chocolate.  High-fat foods, including meats and fried foods. Extra added fats including oils, butter, salad dressings, and nuts. Limit these to less than 8 tsp per day.  Fruits and vegetables if they are not tolerated, such as citrus fruits or tomatoes.  Alcohol.  Any food that seems to aggravate your condition. If you have questions regarding your diet, call your caregiver or a registered dietitian. OTHER THINGS THAT MAY HELP GERD INCLUDE:    Eating your meals slowly, in a relaxed setting.  Eating 5 to 6 small meals per day instead of 3 large meals.  Eliminating food for a period of time if it causes distress.  Not lying down until 3 hours after eating a meal.  Keeping the head of your bed raised 6 to 9 inches (15 to 23 cm) by using a foam wedge or blocks under the legs of the bed. Lying flat may make symptoms worse.  Being physically active. Weight loss may be helpful in reducing reflux in overweight or obese adults.  Wear loose fitting clothing EXAMPLE MEAL PLAN This meal plan is approximately 2,000 calories based on https://www.[facility].org/ meal planning guidelines. Breakfast   cup cooked oatmeal.  1 cup strawberries.  1 cup low-fat milk.  1 oz almonds. Snack  1 cup cucumber slices.  6 oz yogurt (made from low-fat or fat-free milk). Lunch  2 slice whole-wheat bread.  2 oz sliced [place].  2 tsp mayonnaise.  1 cup blueberries.  1 cup snap peas. Snack  6 whole-wheat crackers.  1 oz string cheese. Dinner   cup brown rice.  1 cup mixed veggies.  1 tsp olive oil.  3 oz grilled fish. Document Released: 06/30/2005 Document Revised: 09/22/2011 Document Reviewed: 05/16/2011 [facility] Patient Information 2013 [place], [place].

## 2012-08-17 NOTE — Progress Notes (Signed)
Chief Complaint  Patient presents with  . Nasal Congestion    Ongoing for 2-3 weeks.  Also thinks she needs something for acid reflux.  She is constantly clearing her throat.  . Sore Throat  . Cough    HPI: Patient comes in today for SDA for  new problem evaluation. ? Some acid reflux    No fever and sever body aches but tired . Onset  A week as above and progressed  No fever  No sob  No NVD no fver chills   Congestion continues sore throat   Now right face pain  And pressure with teeth hurting . ROS: See pertinent positives and negatives per HPI. No sop hemoptysis   Going on trip this weekend and concerned about progress of this illness  Past Medical History  Diagnosis Date  . Osteoporosis     by dexa 2009 had se of fosamax  . Hx MRSA infection   . Abnormal thyroid blood test     rx wiht med briefly resolved no sx     Family History  Problem Relation Age of Onset  . Hyperlipidemia Mother   . Heart disease Mother   . Hypertension Mother   . Cancer Father     History   Social History  . Marital Status: Married    Spouse Name: N/A    Number of Children: N/A  . Years of Education: N/A   Social History Main Topics  . Smoking status: Never Smoker   . Smokeless tobacco: None  . Alcohol Use: No  . Drug Use: No  . Sexually Active:    Other Topics Concern  . 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    Outpatient Encounter Prescriptions as of 08/17/2012  Medication Sig Dispense Refill  . aspirin 81 MG tablet Take 81 mg by mouth daily.        Marland Kitchen amoxicillin-clavulanate (AUGMENTIN) 875-125 MG per tablet Take 1 tablet by mouth every 12 (twelve) hours.  20 tablet  0  . zoster vaccine live, PF, (ZOSTAVAX) 14782  UNT/0.65ML injection Inject 19,400 Units into the skin once.  1 each  0    EXAM:  BP 120/84  Pulse 86  Temp 97.9 F (36.6 C) (Oral)  Wt 139 lb (63.05 kg)  SpO2 98%  There is no height on file to calculate BMI.  WDWN in NAD  quiet respirations; mod  congested  somewhat hoarse. Non toxic . HEENT: Normocephalic ;atraumatic , Eyes;  PERRL, EOMs  Full, lids and conjunctiva clear,,Ears: no deformities, canals nl, TM landmarks normal, Nose: no deformity or discharge but congested;face  Right maxilla frontal tender Mouth : OP clear without lesion or edema . Mod red  Neck: Supple without adenopathy or masses or bruits Chest:  Clear to A&P without wheezes rales or rhonchi CV:  S1-S2 no gallops or murmurs peripheral perfusion is normal Skin :nl perfusion and no acute rashes  Abdomen:  Sof,t normal bowel sounds without hepatosplenomegaly, no guarding rebound or masses no CVA tenderness Lab Results  Component Value Date   WBC 3.3* 06/14/2012   HGB 12.8 06/14/2012   HCT 38.8 06/14/2012   PLT 192.0 06/14/2012   GLUCOSE 95 08/09/2012   CHOL 207* 06/14/2012  TRIG 103.0 06/14/2012   HDL 56.10 06/14/2012   LDLDIRECT 136.4 06/14/2012   ALT 31 06/14/2012   AST 26 06/14/2012   NA 140 08/09/2012   K 5.3* 08/09/2012   CL 103 08/09/2012   CREATININE 0.8 08/09/2012   BUN 20 08/09/2012   CO2 30 08/09/2012   TSH 1.18 08/09/2012    ASSESSMENT AND PLAN:  Discussed the following assessment and plan:  1. Sinusitis    right    2. Chronic throat clearing    poss  ELR   diet changes and prilosec  for now fu after  trip for now  3. Serum potassium elevated   reviewed ;labs with elevated k persist 5.2  Uncertain if important  Noted make sure no supplements and care with nsaids  dont think wbc is cause of problems -Patient advised to return or notify health care team  if symptoms worsen or persist or new concerns arise.  Patient Instructions  You have a sinusitis   On the right  Cough may continue longer     Decongestants    Warm compresses and help the pressure  And expect improvement in the next 3-5 day s. Lungs are clear at this time   For reflux sx  Would try OTC omeprazole every day for 2 weeks and then see how  You are doing.   Sinusitis Sinusitis is redness, soreness, and swelling (inflammation) of the paranasal sinuses. Paranasal sinuses are air pockets within the bones of your face (beneath the eyes, the middle of the forehead, or above the eyes). In healthy paranasal sinuses, mucus is able to drain out, and air is able to circulate through them by way of your nose. However, when your paranasal sinuses are inflamed, mucus and air can become trapped. This can allow bacteria and other germs to grow and cause infection. Sinusitis can develop quickly and last only a short time (acute) or continue over a long period (chronic). Sinusitis that lasts for more than 12 weeks is considered chronic.  CAUSES  Causes of sinusitis include:  Allergies.  Structural abnormalities, such as displacement of the cartilage that separates your nostrils (deviated septum), which can decrease the air flow through your nose and sinuses and affect sinus drainage.  Functional abnormalities, such as when the small hairs (cilia) that line your sinuses and help remove mucus do not work properly or are not present. SYMPTOMS  Symptoms of acute and chronic sinusitis are the same. The primary symptoms are pain and pressure around the affected sinuses. Other symptoms include:  Upper toothache.  Earache.  Headache.  Bad breath.  Decreased sense of smell and taste.  A cough, which worsens when you are lying flat.  Fatigue.  Fever.  Thick drainage from your nose, which often is green and may contain pus (purulent).  Swelling and warmth over the affected sinuses. DIAGNOSIS  Your caregiver will perform a physical exam. During the exam, your caregiver may:  Look in your nose for signs of abnormal growths in your  nostrils (nasal polyps).  Tap over the affected sinus to check for signs of infection.  View the inside of your sinuses (endoscopy) with a special imaging device with a light attached (endoscope), which is inserted into your sinuses. If your caregiver suspects that you have chronic sinusitis, one or more of the following tests may be recommended:  Allergy tests.  Nasal culture A sample of mucus is taken from your nose and sent to a lab and screened for bacteria.  Nasal cytology A sample of mucus is taken from your nose and examined by your caregiver to determine if your sinusitis is related to an allergy. TREATMENT  Most cases of acute sinusitis are related to a viral infection and will resolve on their own within 10 days. Sometimes medicines are prescribed to help relieve symptoms (pain medicine, decongestants, nasal steroid sprays, or saline sprays).  However, for sinusitis related to a bacterial infection, your caregiver will prescribe antibiotic medicines. These are medicines that will help kill the bacteria causing the infection.  Rarely, sinusitis is caused by a fungal infection. In theses cases, your caregiver will prescribe antifungal medicine. For some cases of chronic sinusitis, surgery is needed. Generally, these are cases in which sinusitis recurs more than 3 times per year, despite other treatments. HOME CARE INSTRUCTIONS   Drink plenty of water. Water helps thin the mucus so your sinuses can drain more easily.  Use a humidifier.  Inhale steam 3 to 4 times a day (for example, sit in the bathroom with the shower running).  Apply a warm, moist washcloth to your face 3 to 4 times a day, or as directed by your caregiver.  Use saline nasal sprays to help moisten and clean your sinuses.  Take over-the-counter or prescription medicines for pain, discomfort, or fever only as directed by your caregiver. SEEK IMMEDIATE MEDICAL CARE IF:  You have increasing pain or severe  headaches.  You have nausea, vomiting, or drowsiness.  You have swelling around your face.  You have vision problems.  You have a stiff neck.  You have difficulty breathing. MAKE SURE YOU:   Understand these instructions.  Will watch your condition.  Will get help right away if you are not doing well or get worse. Document Released: 06/30/2005 Document Revised: 09/22/2011 Document Reviewed: 07/15/2011 [facility] Patient Information 2013 [place], [place]. Title List Changes/Modifications  Diet for Gastroesophageal Reflux Disease, Adult Reflux (acid reflux) is when acid from your stomach flows up into the esophagus. When acid comes in contact with the esophagus, the acid causes irritation and soreness (inflammation) in the esophagus. When reflux happens often or so severely that it causes damage to the esophagus, it is called gastroesophageal reflux disease (GERD). Nutrition therapy can help ease the discomfort of GERD. FOODS OR DRINKS TO AVOID OR LIMIT  Smoking or chewing tobacco. Nicotine is one of the most potent stimulants to acid production in the gastrointestinal tract.  Caffeinated and decaffeinated coffee and black tea.  Regular or low-calorie carbonated beverages or energy drinks (caffeine-free carbonated beverages are allowed).   Strong spices, such as black pepper, white pepper, red pepper, cayenne, curry powder, and chili powder.  Peppermint or spearmint.  Chocolate.  High-fat foods, including meats and fried foods. Extra added fats including oils, butter, salad dressings, and nuts. Limit these to less than 8 tsp per day.  Fruits and vegetables if they are not tolerated, such as citrus fruits or tomatoes.  Alcohol.  Any food that seems to aggravate your condition. If you have questions regarding your diet, call your caregiver or a registered dietitian. OTHER THINGS THAT MAY HELP GERD INCLUDE:   Eating your meals slowly, in a relaxed setting.  Eating 5 to 6  small meals per day instead of 3 large meals.  Eliminating food for a period of time if it causes distress.  Not lying down until 3 hours after eating a meal.  Keeping the head of your bed raised 6 to 9 inches (15 to 23 cm)  by using a foam wedge or blocks under the legs of the bed. Lying flat may make symptoms worse.  Being physically active. Weight loss may be helpful in reducing reflux in overweight or obese adults.  Wear loose fitting clothing EXAMPLE MEAL PLAN This meal plan is approximately 2,000 calories based on https://www.bernard.org/ meal planning guidelines. Breakfast   cup cooked oatmeal.  1 cup strawberries.  1 cup low-fat milk.  1 oz almonds. Snack  1 cup cucumber slices.  6 oz yogurt (made from low-fat or fat-free milk). Lunch  2 slice whole-wheat bread.  2 oz sliced [place].  2 tsp mayonnaise.  1 cup blueberries.  1 cup snap peas. Snack  6 whole-wheat crackers.  1 oz string cheese. Dinner   cup brown rice.  1 cup mixed veggies.  1 tsp olive oil.  3 oz grilled fish. Document Released: 06/30/2005 Document Revised: 09/22/2011 Document Reviewed: 05/16/2011 [facility] Patient Information 2013 [place], [place].       Neta Mends. Sahir Tolson M.D.

## 2012-08-18 ENCOUNTER — Encounter: Payer: BC Managed Care – PPO | Admitting: Internal Medicine

## 2012-08-19 DIAGNOSIS — R6889 Other general symptoms and signs: Secondary | ICD-10-CM | POA: Insufficient documentation

## 2012-09-08 ENCOUNTER — Telehealth: Payer: Self-pay | Admitting: Internal Medicine

## 2012-09-08 MED ORDER — HYDROCODONE-HOMATROPINE 5-1.5 MG/5ML PO SYRP: 5.0000 mL | ORAL_SOLUTION | ORAL | Status: DC | PRN

## 2012-09-08 MED ORDER — CLARITHROMYCIN 500 MG PO TABS: 500.0000 mg | ORAL_TABLET | Freq: Two times a day (BID) | ORAL | Status: DC

## 2012-09-08 NOTE — Telephone Encounter (Signed)
Patient was in on 2/4 and received a 10-day course of amoxicillin-clavulanate (AUGMENTIN) 875-125 MG per tablet. Patient states her cough is still lingering and wants abx refill. Please call and advise pt. I did tell her Dr. Demetrius Charity was not here this week.

## 2012-09-08 NOTE — Telephone Encounter (Signed)
Spoke to the pt.  She continues to have rt side nasal congestion.  She is not using anything OTC to treat this.  A productive cough of dark yellow and green phlegm.  She has tried Robitussin DM with no results.  She has been having chills but uncertain if she has a fever.  She was seen for sinusitis on 08/17/12 by [facility].  She is requesting another antibiotic and cough syrup.  Please advise.  Thanks!!

## 2012-09-08 NOTE — Telephone Encounter (Signed)
Call in Biaxin 500 mg bid for 10 days, also Hydromet syrup to take 5 ml q 4 hours prn cough, 240 ml with no rf

## 2012-09-08 NOTE — Telephone Encounter (Signed)
Both medications called to [facility].  Pt notified by telephone.

## 2012-09-09 ENCOUNTER — Encounter (HOSPITAL_COMMUNITY): Payer: Self-pay | Admitting: Adult Health

## 2012-09-09 ENCOUNTER — Emergency Department (HOSPITAL_COMMUNITY)
Admission: EM | Admit: 2012-09-09 | Discharge: 2012-09-09 | Disposition: A | Discharge status: Home / Self Care | Payer: BC Managed Care – PPO | Attending: Emergency Medicine | Admitting: Emergency Medicine

## 2012-09-09 DIAGNOSIS — E860 Dehydration: Secondary | ICD-10-CM | POA: Insufficient documentation

## 2012-09-09 DIAGNOSIS — R112 Nausea with vomiting, unspecified: Secondary | ICD-10-CM | POA: Insufficient documentation

## 2012-09-09 DIAGNOSIS — Z8739 Personal history of other diseases of the musculoskeletal system and connective tissue: Secondary | ICD-10-CM | POA: Insufficient documentation

## 2012-09-09 DIAGNOSIS — Z79899 Other long term (current) drug therapy: Secondary | ICD-10-CM | POA: Insufficient documentation

## 2012-09-09 DIAGNOSIS — Z7982 Long term (current) use of aspirin: Secondary | ICD-10-CM | POA: Insufficient documentation

## 2012-09-09 DIAGNOSIS — R059 Cough, unspecified: Secondary | ICD-10-CM | POA: Insufficient documentation

## 2012-09-09 DIAGNOSIS — R05 Cough: Secondary | ICD-10-CM | POA: Insufficient documentation

## 2012-09-09 DIAGNOSIS — Z8614 Personal history of Methicillin resistant Staphylococcus aureus infection: Secondary | ICD-10-CM | POA: Insufficient documentation

## 2012-09-09 DIAGNOSIS — R1110 Vomiting, unspecified: Secondary | ICD-10-CM

## 2012-09-09 LAB — CBC WITH DIFFERENTIAL/PLATELET
Basophils Absolute: 0 10*3/uL (ref 0.0–0.1)
Eosinophils Relative: 0 % (ref 0–5)
HCT: 35.7 % — ABNORMAL LOW (ref 36.0–46.0)
Hemoglobin: 12.6 g/dL (ref 12.0–15.0)
Lymphocytes Relative: 26 % (ref 12–46)
Lymphs Abs: 1.3 10*3/uL (ref 0.7–4.0)
MCV: 83.8 fL (ref 78.0–100.0)
Monocytes Absolute: 0.4 10*3/uL (ref 0.1–1.0)
Monocytes Relative: 9 % (ref 3–12)
Neutro Abs: 3.1 10*3/uL (ref 1.7–7.7)
RBC: 4.26 MIL/uL (ref 3.87–5.11)
WBC: 4.9 10*3/uL (ref 4.0–10.5)

## 2012-09-09 LAB — COMPREHENSIVE METABOLIC PANEL
Albumin: 3.6 g/dL (ref 3.5–5.2)
BUN: 14 mg/dL (ref 6–23)
Calcium: 9.4 mg/dL (ref 8.4–10.5)
GFR calc Af Amer: >90 mL/min (ref >90)
Glucose, Bld: 145 mg/dL — ABNORMAL HIGH (ref 70–99)
Potassium: 3.3 mEq/L — ABNORMAL LOW (ref 3.5–5.1)
Total Protein: 7.4 g/dL (ref 6.0–8.3)

## 2012-09-09 LAB — POCT I-STAT, CHEM 8
BUN: 14 mg/dL (ref 6–23)
Chloride: 99 mEq/L (ref 96–112)
Creatinine, Ser: 0.6 mg/dL (ref 0.50–1.10)
Hemoglobin: 21.8 g/dL (ref 12.0–15.0)
Potassium: 3.3 mEq/L — ABNORMAL LOW (ref 3.5–5.1)
Sodium: 133 mEq/L — ABNORMAL LOW (ref 135–145)

## 2012-09-09 MED ORDER — ONDANSETRON 4 MG PO TBDP
ORAL_TABLET | ORAL | Status: AC
  Filled 2012-09-09: qty 2

## 2012-09-09 MED ORDER — SODIUM CHLORIDE 0.9 % IV BOLUS (SEPSIS)
1000.0000 mL | Freq: Once | INTRAVENOUS | Status: AC
  Administered 2012-09-09: 1000 mL via INTRAVENOUS

## 2012-09-09 MED ORDER — ONDANSETRON 4 MG PO TBDP
8.0000 mg | ORAL_TABLET | Freq: Once | ORAL | Status: AC
  Administered 2012-09-09: 8 mg via ORAL

## 2012-09-09 MED ORDER — ONDANSETRON HCL 4 MG PO TABS: 4.0000 mg | ORAL_TABLET | Freq: Four times a day (QID) | ORAL | Status: DC

## 2012-09-09 NOTE — ED Notes (Signed)
Pt ambulatory leaving ED with husband; pt leaving with d/c teaching and prescriptions; pt alert and mentating appropriately upon d/c; pt denies pain; pt does not show signs of acute distress upon d/c; Pt verbalizes understanding of d/c teaching and has no further questions upon d/c.

## 2012-09-09 NOTE — ED Notes (Signed)
PResents with nausea, cough overall weakness since Tuesday. Pt states, "I think I am really dehydrated" unable to hold food and drink down since Tuesday. Denies pain.

## 2012-09-09 NOTE — ED Notes (Signed)
Pt denies fever/chills. Pt denies pain. Pt alert and mentating appropriately. Pt states numbness and tingling in face and hands that recently started and states she thinks she was hyperventilating. Pt states she has been back from cruise for about 1.5 weeks. Pt denies dizziness and lightheadedness; pt denies headaches and blurred vision.

## 2012-09-09 NOTE — ED Provider Notes (Signed)
Medical screening examination/treatment/procedure(s) were performed by non-physician practitioner and as supervising physician I was immediately available for consultation/collaboration.   Richardean Canal, MD 09/09/12 1946

## 2012-09-09 NOTE — ED Notes (Signed)
Pt alert and mentating appropriately; pt states "I feel great"

## 2012-09-09 NOTE — ED Notes (Signed)
Verified with Fayrene Helper, PA that i-stat chem 8 does not need to be done because CMP was done earlier and resulted

## 2012-09-09 NOTE — ED Provider Notes (Signed)
History     CSN: [id number]  Arrival date & time 09/09/12  1737   First MD Initiated Contact with Patient 09/09/12 1754      Chief Complaint  Patient presents with  . Nausea    (Consider location/radiation/quality/duration/timing/severity/associated sxs/prior treatment) HPI  62 year-old female presents with flulike symptoms. Patient reports for the past 2 days she has been feeling nauseated and has had 2 bouts of nonbloody nonbilious vomitus. She has an appetite but afraid to eat any food. She reports having generalized weakness and feels dehydrated. She endorses a nonproductive cough for the past 5 days. Cough is minimally improved with Hycodan prescribed by her PCP. She was also prescribed Augmentin, and Biaxin for sinusitis which she is currently taking. Patient denies fever but endorsed chills. She denies headache, sneezing, runny nose, sore throat, chest pain, shortness of breath, dyspnea on exertion, abdominal pain, or dysuria. She denies any rash. Her last bowel movement was 3 days ago. She is able to pass flatus. No significant history of abdominal surgery. Patient states "I have felt like this before and usually IV fluid helps". Patient is a nonsmoker.  Past Medical History  Diagnosis Date  . Osteoporosis     by dexa 2009 had se of fosamax  . Hx MRSA infection   . Abnormal thyroid blood test     rx wiht med briefly resolved no sx     Past Surgical History  Procedure Laterality Date  . Tonsillectomy      age 59    Family History  Problem Relation Age of Onset  . Hyperlipidemia Mother   . Heart disease Mother   . Hypertension Mother   . Cancer Father     History  Substance Use Topics  . Smoking status: Never Smoker   . Smokeless tobacco: Not on file  . Alcohol Use: No    OB History   Grav Para Term Preterm Abortions TAB SAB Ect Mult Living                  Review of Systems  Constitutional:       10 Systems reviewed and all are negative for acute change  except as noted in the HPI.     Allergies  Alendronate sodium and Hydrocodeine  Home Medications   Current Outpatient Rx  Name  Route  Sig  Dispense  Refill  . amoxicillin-clavulanate (AUGMENTIN) 875-125 MG per tablet   Oral   Take 1 tablet by mouth every 12 (twelve) hours.   20 tablet   0   . aspirin 81 MG tablet   Oral   Take 81 mg by mouth daily.           . clarithromycin (BIAXIN) 500 MG tablet   Oral   Take 1 tablet (500 mg total) by mouth 2 (two) times daily.   20 tablet   0   . HYDROcodone-homatropine (HYCODAN) 5-1.5 MG/5ML syrup   Oral   Take 5 mLs by mouth every 4 (four) hours as needed for cough.   240 mL   0   . zoster vaccine live, PF, (ZOSTAVAX) 11914 UNT/0.65ML injection   Subcutaneous   Inject 19,400 Units into the skin once.   1 each   0     BP 115/82  Pulse 84  Temp(Src) 98.3 F (36.8 C) (Oral)  Resp 18  SpO2 100%  Physical Exam  Nursing note and vitals reviewed. Constitutional: She is oriented to person, place, and time. She  appears well-developed and well-nourished. No distress.  Awake, alert, nontoxic appearance  HENT:  Head: Atraumatic.  Right Ear: External ear normal.  Left Ear: External ear normal.  Mouth/Throat: Oropharynx is clear and moist.  Eyes: Conjunctivae and EOM are normal. Pupils are equal, round, and reactive to light. Right eye exhibits no discharge. Left eye exhibits no discharge.  Neck: Neck supple.  Cardiovascular: Normal rate and regular rhythm.   Pulmonary/Chest: Effort normal. No respiratory distress. She has no wheezes. She exhibits no tenderness.  Abdominal: Soft. There is no tenderness. There is no rebound.  Musculoskeletal: Normal range of motion. She exhibits no edema and no tenderness.  ROM appears intact, no obvious focal weakness  Lymphadenopathy:    She has no cervical adenopathy.  Neurological: She is alert and oriented to person, place, and time.  Mental status and motor strength appears  intact  5 out of 5 strength to all 4 extremities.  Skin: No rash noted.  Psychiatric: She has a normal mood and affect.    ED Course  Procedures (including critical care time)  Labs Reviewed  COMPREHENSIVE METABOLIC PANEL - Abnormal; Notable for the following:    Sodium 132 (*)    Potassium 3.3 (*)    Chloride 95 (*)    Glucose, Bld 145 (*)    All other components within normal limits  CBC WITH DIFFERENTIAL - Abnormal; Notable for the following:    HCT 35.7 (*)    All other components within normal limits  POCT I-STAT, CHEM 8 - Abnormal; Notable for the following:    Sodium 133 (*)    Potassium 3.3 (*)    Glucose, Bld 133 (*)    Calcium, Ion 1.12 (*)    Hemoglobin 21.8 (*)    HCT 64.0 (*)    All other components within normal limits  LIPASE, BLOOD  POCT I-STAT TROPONIN I   No results found.   No diagnosis found.  6:08 PM Patient was seen and evaluate by need for the flulike symptoms. She appears nontoxic without obvious signs of dehydration. Patient does request for IV fluid, which I'm happy offered. Her abdomen is nonsurgical. Lungs exam is unremarkable. Will check electrolytes, and will give IV fluid, and antinausea medication. We'll continue to monitor.  7:21 PM Istat8 with Hgb 21.8, which is likely an error.  CBC today with Hgb of 12.6, which is likely accurate.  Labwork otherwise unremarkable. Patient felt much better after receiving IV fluid. Patient is stable for discharge. Patient will follow up with her PCP for further management. Return precautions given.  BP 108/70  Pulse 69  Temp(Src) 98.3 F (36.8 C) (Oral)  Resp 16  SpO2 95%  I have reviewed nursing notes and vital signs. I personally reviewed the imaging tests through PACS system  I reviewed available ER/hospitalization records thought the EMR  1. vomiting  MDM          Fayrene Helper, PA-C 09/09/12 1941

## 2012-09-09 NOTE — ED Notes (Signed)
Verified with mini lab, Sallyanne Kuster, EMT. Pts blood still in lab and could be used for [employer] 8

## 2012-09-13 ENCOUNTER — Telehealth: Payer: Self-pay | Admitting: Internal Medicine

## 2012-09-13 NOTE — Telephone Encounter (Signed)
Patient Information:  Caller Name: Kahlan  Phone: [phone]  Patient: Jenny [name], Jenny [name]  Gender: Female  DOB: 1950/07/24  Age: 62 Years  PCP: Berniece Andreas (Family Practice)  Office Follow Up:  Does the office need to follow up with this patient?: No  Instructions For The Office: N/A   Symptoms  Reason For Call & Symptoms: About 1 month ago she has an URI and got antibiotics from Dr. Fabian Sharp.  She went on a cruise and then came back and had  a cough and got more antibiotics and a codiene cough syrup.  Last vomited 09/12/12 and no diarrhea.  After triage I suspect the Hydrocodone cough syrup as she lists Hydrocodone as an allergy.  She is going to try rehydration and then if nausea continues tonight into tomorrow call back.    Reviewed Health History In EMR: Yes  Reviewed Medications In EMR: Yes  Reviewed Allergies In EMR: Yes  Reviewed Surgeries / Procedures: Yes  Date of Onset of Symptoms: 09/07/2012  Treatments Tried: She just stopped taking the cough today.  She does have an algery to Hydrocodone.  Treatments Tried Worked: No  Guideline(s) Used:  Vomiting  Disposition Per Guideline:   Home Care  Reason For Disposition Reached:   Vomiting  Advice Given:  Reassurance:  Vomiting can be caused by many types of illnesses. It can be caused by a stomach flu virus. It can be caused by eating or drinking something that disagreed with your stomach.  Reassurance:  Vomiting can be caused by many types of illnesses. It can be caused by a stomach flu virus. It can be caused by eating or drinking something that disagreed with your stomach.  Adults with vomiting need to stay hydrated. This is the most important thing. If you don't drink and replace lost fluids, you may get dehydrated.  Clear Liquids:  Sip water or a rehydration drink (e.g., Gatorade or Powerade).  Other options: 1/2 strength flat lemon-lime soda or ginger ale.  For Non-stop Vomiting, Try Sleeping:  Try to go to sleep  (Reason: sleep often empties the stomach and relieves the need to vomit).  For Non-stop Vomiting, Try Sleeping:  Try to go to sleep (Reason: sleep often empties the stomach and relieves the need to vomit).  When you awaken, resume drinking liquids. Water works best initially.  Avoid Nonprescription Medicines:  Stop all nonprescription medicines for 24 hours (Reason: they may make vomiting worse).  Call if vomiting a prescription medicine.  Expected Course:  Vomiting from viral gastritis usually stops in 12 to 48 hours.  People with mild dehydration can usually treat themselves at home, by drinking more liquids.

## 2012-10-04 ENCOUNTER — Other Ambulatory Visit: Payer: Self-pay | Admitting: Internal Medicine

## 2012-10-04 DIAGNOSIS — Z1231 Encounter for screening mammogram for malignant neoplasm of breast: Secondary | ICD-10-CM

## 2012-10-06 ENCOUNTER — Telehealth: Payer: Self-pay | Admitting: Internal Medicine

## 2012-10-06 NOTE — Telephone Encounter (Signed)
We received notification from [facility] that this pt never went to [facility] for chest films ordered by [facility] 08/09/12. Please follow up with patient or cancel order if appropriate. Thank you.

## 2012-10-13 ENCOUNTER — Ambulatory Visit (HOSPITAL_COMMUNITY): Payer: BC Managed Care – PPO

## 2012-10-14 ENCOUNTER — Ambulatory Visit (HOSPITAL_COMMUNITY)
Admission: RE | Admit: 2012-10-14 | Discharge: 2012-10-14 | Disposition: A | Discharge status: Home / Self Care | Payer: BC Managed Care – PPO | Source: Ambulatory Visit | Attending: Internal Medicine | Admitting: Internal Medicine

## 2012-10-14 DIAGNOSIS — Z1231 Encounter for screening mammogram for malignant neoplasm of breast: Secondary | ICD-10-CM | POA: Insufficient documentation

## 2012-10-15 NOTE — Telephone Encounter (Signed)
Contact patient  About this . If not having any pain or cough can cancel the x ray  Otherwise would have her proceed and get this done.

## 2012-10-18 NOTE — Telephone Encounter (Signed)
Left message on home and cell for the pt to return my call. 

## 2012-10-19 ENCOUNTER — Encounter: Payer: Self-pay | Admitting: Internal Medicine

## 2012-10-19 NOTE — Telephone Encounter (Signed)
Patient is feeling better but now believes she has acid reflux.  Made an appt with Pulmonary on 10/27/12.

## 2012-10-27 ENCOUNTER — Institutional Professional Consult (permissible substitution): Payer: BC Managed Care – PPO | Admitting: Emergency Medicine

## 2012-11-26 ENCOUNTER — Ambulatory Visit (INDEPENDENT_AMBULATORY_CARE_PROVIDER_SITE_OTHER)
Admission: RE | Admit: 2012-11-26 | Discharge: 2012-11-26 | Disposition: A | Discharge status: Home / Self Care | Payer: BC Managed Care – PPO | Source: Ambulatory Visit | Attending: Internal Medicine | Admitting: Internal Medicine

## 2012-11-26 ENCOUNTER — Ambulatory Visit (INDEPENDENT_AMBULATORY_CARE_PROVIDER_SITE_OTHER): Payer: BC Managed Care – PPO | Admitting: Internal Medicine

## 2012-11-26 ENCOUNTER — Encounter: Payer: Self-pay | Admitting: Internal Medicine

## 2012-11-26 VITALS — BP 102/70 | HR 72 | Temp 98.2°F | Ht 65.25 in | Wt 138.0 lb

## 2012-11-26 DIAGNOSIS — R079 Chest pain, unspecified: Secondary | ICD-10-CM

## 2012-11-26 DIAGNOSIS — M546 Pain in thoracic spine: Secondary | ICD-10-CM

## 2012-11-26 DIAGNOSIS — R059 Cough, unspecified: Secondary | ICD-10-CM

## 2012-11-26 DIAGNOSIS — R05 Cough: Secondary | ICD-10-CM

## 2012-11-26 NOTE — Patient Instructions (Addendum)
Try prilosec 20mg   Take 30-60 min before first meal of the day and Pepcid 20 mg one bedtime  For one month and if not 100% better you will need to see a GI doctor  GERD (REFLUX)  is an extremely common cause of respiratory symptoms, many times with no significant heartburn at all.    It can be treated with medication, but also with lifestyle changes including avoidance of late meals, excessive alcohol, smoking cessation, and avoid fatty foods, chocolate, peppermint, colas, red wine, and acidic juices such as orange juice.  NO MINT OR MENTHOL PRODUCTS SO NO COUGH DROPS  USE SUGARLESS CANDY INSTEAD (jolley ranchers or Stover's)  NO OIL BASED VITAMINS - use powdered substitutes.   Please remember to go to the  x-ray department downstairs for your tests - we will call you with the results when they are available.

## 2012-11-26 NOTE — Progress Notes (Signed)
  Subjective:    Patient ID: Jenny [name], female    DOB: 07/27/1950   MRN: [id number]  HPI  52 yowm never smoker no prev resp problems with new onset cough/cp > ER 09/09/12 dx with GERD some better after treatment but referred by [facility] for eval of persistent cp   11/26/2012 1st pulmonary eval cc new onset cough assoc with overt HB and reflux of food x 3 months better on gerd rx but still some cough and  Mild persistent pain across mid  Back (at bra line) not related to coughing pain across both sides of spine= but worset in midline.  Pain came on acutely sev weeks prior to OV   ? p coughing and persistent since then worse in certain positions, no made worse by cough (though not really coughing hard now) or deep breathing and not sob.  Has not tried nsaids  No obvious daytime variabilty or assoc orr chest tightness, subjective wheeze overt sinus or hb symptoms. No unusual exp hx or h/o childhood pna/ asthma or premature birth to his knowledge.   Sleeping ok without nocturnal  or early am exacerbation  of respiratory  c/o's or need for noct saba. Also denies any obvious fluctuation of symptoms with weather or environmental changes or other aggravating or alleviating factors except as outlined above     Review of Systems  Constitutional: Negative for fever and unexpected weight change.  HENT: Negative for ear pain, nosebleeds, congestion, sore throat, rhinorrhea, sneezing, trouble swallowing, dental problem, postnasal drip and sinus pressure.   Eyes: Negative for redness and itching.  Respiratory: Negative for cough, chest tightness, shortness of breath and wheezing.   Cardiovascular: Negative for palpitations and leg swelling.  Gastrointestinal: Negative for nausea and vomiting.       Heartburn, Indigestion  Genitourinary: Negative for dysuria.  Musculoskeletal: Positive for back pain. Negative for joint swelling.  Skin: Negative for rash.  Neurological: Negative for headaches.   Hematological: Does not bruise/bleed easily.  Psychiatric/Behavioral: Negative for dysphoric mood. The patient is not nervous/anxious.        Objective:   Physical Exam amb wf nad Wt Readings from Last 3 Encounters:  11/26/12 138 lb (62.596 kg)  08/17/12 139 lb (63.05 kg)  08/09/12 139 lb (63.05 kg)    HEENT: nl dentition, turbinates, and orophanx. Nl external ear canals without cough reflex   NECK :  without JVD/Nodes/TM/ nl carotid upstrokes bilaterally   LUNGS: no acc muscle use, clear to A and P bilaterally without cough on insp or exp maneuvers   CV:  RRR  no s3 or murmur or increase in P2, no edema   ABD:  soft and nontender with nl excursion in the supine position. No bruits or organomegaly, bowel sounds nl  MS:  warm without deformities, calf tenderness, cyanosis or clubbing  SKIN: warm and dry without lesions    NEURO:  alert, approp, no deficits    cxr 11/26/12 No acute cardiopulmonary disease       Assessment & Plan:

## 2012-11-27 DIAGNOSIS — R05 Cough: Secondary | ICD-10-CM | POA: Insufficient documentation

## 2012-11-27 NOTE — Assessment & Plan Note (Signed)
Started apparently with uri - Explained natural history of uri and why it's necessary in patients at risk to treat GERD aggressively  at least  short term   to reduce risk of evolving cyclical cough initially  triggered by epithelial injury and a heightened sensitivty to the effects of any upper airway irritants,  most importantly acid - related.  That is, the more sensitive the epithelium damaged for virus, the more the cough, the more the secondary reflux (especially in those prone to reflux) the more the irritation of the sensitive mucosa and so on in a cyclical pattern.   If needs aggressive chronic gerd rx might consider GI eval for ? NF as she might be a good candidate at age 62 to reduce dependency on medical rx longterm  Pulmonary f/u is prn

## 2012-11-27 NOTE — Assessment & Plan Note (Signed)
Strongly suspect this was mechanical injury from coughing so needs to continue rx gerd, the likely cause of cough, and just take tylenol as most nsaid's may aggravate the gerd.  Defer further w/u to Dr Fabian Sharp

## 2012-11-30 ENCOUNTER — Telehealth: Payer: Self-pay | Admitting: Internal Medicine

## 2012-11-30 NOTE — Telephone Encounter (Signed)
Pt returned call and can be reached @ [phone]. Jenny [name]

## 2012-11-30 NOTE — Telephone Encounter (Signed)
Per pt's chart:  Result Notes    Notes Recorded by Christen Butter, CMA on 11/30/2012 at 8:42 AM [facility] ------  Notes Recorded by Nyoka Cowden, MD on 11/27/2012 at 6:44 AM Call pt: Reviewed cxr and no acute change so no change in recommendations made at [facility] x1.

## 2012-11-30 NOTE — Telephone Encounter (Signed)
Pt advised. Jenny [name], CMA  

## 2012-12-01 NOTE — Progress Notes (Signed)
Quick Note:  Spoke with pt and notified of results per Dr. Wert. Pt verbalized understanding and denied any questions.  ______ 

## 2013-04-18 ENCOUNTER — Telehealth: Payer: Self-pay | Admitting: Internal Medicine

## 2013-04-18 NOTE — Telephone Encounter (Signed)
Pt states that she received a letter from [employer] [facility]. They're offering her 5 test (stroke, carotid artery, heart rhythm, AAA, PAD, and osteopetrosis risk assessment). She states that they are claiming that the insurance wont pay for it, but that it will only cost her 150$. She is inquiring to see if this is a legitimate offer, please advise.

## 2013-04-18 NOTE — Telephone Encounter (Signed)
Have you heard of this?

## 2013-04-20 NOTE — Telephone Encounter (Signed)
Patient notified of all.  Sent to the front desk to be scheduled for physical in Jan.

## 2013-04-20 NOTE — Telephone Encounter (Signed)
I dont advise this screening because there is no credible evidence that it helps your health or prolongs life.  Can discuss her prevnetive needs at her next preventive  Or other visit

## 2013-07-20 ENCOUNTER — Other Ambulatory Visit (INDEPENDENT_AMBULATORY_CARE_PROVIDER_SITE_OTHER): Payer: BC Managed Care – PPO

## 2013-07-20 DIAGNOSIS — Z0000 Encounter for general adult medical examination without abnormal findings: Secondary | ICD-10-CM

## 2013-07-20 LAB — HEPATIC FUNCTION PANEL
ALK PHOS: 114 U/L (ref 39–117)
ALT: 19 U/L (ref 0–35)
AST: 23 U/L (ref 0–37)
Albumin: 4.7 g/dL (ref 3.5–5.2)
BILIRUBIN DIRECT: 0.1 mg/dL (ref 0.0–0.3)
BILIRUBIN TOTAL: 0.6 mg/dL (ref 0.3–1.2)
TOTAL PROTEIN: 7.6 g/dL (ref 6.0–8.3)

## 2013-07-20 LAB — BASIC METABOLIC PANEL
BUN: 21 mg/dL (ref 6–23)
CALCIUM: 9.5 mg/dL (ref 8.4–10.5)
CO2: 29 meq/L (ref 19–32)
CREATININE: 0.7 mg/dL (ref 0.4–1.2)
Chloride: 103 mEq/L (ref 96–112)
GFR: 94.22 mL/min (ref >60.00)
Glucose, Bld: 89 mg/dL (ref 70–99)
Potassium: 4.7 mEq/L (ref 3.5–5.1)
Sodium: 140 mEq/L (ref 135–145)

## 2013-07-20 LAB — CBC WITH DIFFERENTIAL/PLATELET
BASOS ABS: 0 10*3/uL (ref 0.0–0.1)
BASOS PCT: 0.7 % (ref 0.0–3.0)
EOS ABS: 0.1 10*3/uL (ref 0.0–0.7)
Eosinophils Relative: 2.5 % (ref 0.0–5.0)
HCT: 40.6 % (ref 36.0–46.0)
Hemoglobin: 13.7 g/dL (ref 12.0–15.0)
LYMPHS PCT: 39 % (ref 12.0–46.0)
Lymphs Abs: 1.8 10*3/uL (ref 0.7–4.0)
MCHC: 33.7 g/dL (ref 30.0–36.0)
MCV: 86.6 fl (ref 78.0–100.0)
MONO ABS: 0.5 10*3/uL (ref 0.1–1.0)
Monocytes Relative: 10.2 % (ref 3.0–12.0)
NEUTROS PCT: 47.6 % (ref 43.0–77.0)
Neutro Abs: 2.1 10*3/uL (ref 1.4–7.7)
PLATELETS: 209 10*3/uL (ref 150.0–400.0)
RBC: 4.69 Mil/uL (ref 3.87–5.11)
RDW: 13 % (ref 11.5–14.6)
WBC: 4.5 10*3/uL (ref 4.5–10.5)

## 2013-07-20 LAB — LIPID PANEL
CHOL/HDL RATIO: 4
CHOLESTEROL: 237 mg/dL — AB (ref 0–200)
HDL: 55.3 mg/dL (ref >39.00)
TRIGLYCERIDES: 133 mg/dL (ref 0.0–149.0)
VLDL: 26.6 mg/dL (ref 0.0–40.0)

## 2013-07-20 LAB — TSH: TSH: 3.79 u[IU]/mL (ref 0.35–5.50)

## 2013-07-20 LAB — LDL CHOLESTEROL, DIRECT: LDL DIRECT: 147.3 mg/dL

## 2013-07-21 ENCOUNTER — Other Ambulatory Visit: Payer: BC Managed Care – PPO

## 2013-07-25 ENCOUNTER — Ambulatory Visit (INDEPENDENT_AMBULATORY_CARE_PROVIDER_SITE_OTHER): Payer: BC Managed Care – PPO | Admitting: Internal Medicine

## 2013-07-25 ENCOUNTER — Encounter: Payer: Self-pay | Admitting: Internal Medicine

## 2013-07-25 ENCOUNTER — Other Ambulatory Visit (HOSPITAL_COMMUNITY)
Admission: RE | Admit: 2013-07-25 | Discharge: 2013-07-25 | Disposition: A | Discharge status: Home / Self Care | Payer: BC Managed Care – PPO | Source: Ambulatory Visit | Attending: Internal Medicine | Admitting: Internal Medicine

## 2013-07-25 VITALS — BP 100/76 | HR 97 | Temp 98.4°F | Ht 65.25 in | Wt 140.0 lb

## 2013-07-25 DIAGNOSIS — Z124 Encounter for screening for malignant neoplasm of cervix: Secondary | ICD-10-CM | POA: Insufficient documentation

## 2013-07-25 DIAGNOSIS — Z01419 Encounter for gynecological examination (general) (routine) without abnormal findings: Secondary | ICD-10-CM

## 2013-07-25 DIAGNOSIS — K219 Gastro-esophageal reflux disease without esophagitis: Secondary | ICD-10-CM

## 2013-07-25 DIAGNOSIS — Z0000 Encounter for general adult medical examination without abnormal findings: Secondary | ICD-10-CM

## 2013-07-25 DIAGNOSIS — Z1151 Encounter for screening for human papillomavirus (HPV): Secondary | ICD-10-CM | POA: Insufficient documentation

## 2013-07-25 DIAGNOSIS — R0989 Other specified symptoms and signs involving the circulatory and respiratory systems: Secondary | ICD-10-CM

## 2013-07-25 DIAGNOSIS — R6889 Other general symptoms and signs: Secondary | ICD-10-CM

## 2013-07-25 DIAGNOSIS — N898 Other specified noninflammatory disorders of vagina: Secondary | ICD-10-CM

## 2013-07-25 DIAGNOSIS — N760 Acute vaginitis: Secondary | ICD-10-CM | POA: Insufficient documentation

## 2013-07-25 DIAGNOSIS — E785 Hyperlipidemia, unspecified: Secondary | ICD-10-CM

## 2013-07-25 MED ORDER — METRONIDAZOLE 500 MG PO TABS: 500.0000 mg | ORAL_TABLET | Freq: Two times a day (BID) | ORAL | Status: DC

## 2013-07-25 NOTE — Patient Instructions (Addendum)
Continue lifestyle intervention healthy eating and exercise . gerd diet   Will refer to GI .  Will let you know  Pap results. Poss you have bacterial vagenosis  . Can treat with flagyl  Or wait for lab to come back .  Wt Readings from Last 3 Encounters:  07/25/13 140 lb (63.504 kg)  11/26/12 138 lb (62.596 kg)  08/17/12 139 lb (63.05 kg)   Diet for Gastroesophageal Reflux Disease, Adult Reflux (acid reflux) is when acid from your stomach flows up into the esophagus. When acid comes in contact with the esophagus, the acid causes irritation and soreness (inflammation) in the esophagus. When reflux happens often or so severely that it causes damage to the esophagus, it is called gastroesophageal reflux disease (GERD). Nutrition therapy can help ease the discomfort of GERD. FOODS OR DRINKS TO AVOID OR LIMIT  Smoking or chewing tobacco. Nicotine is one of the most potent stimulants to acid production in the gastrointestinal tract.  Caffeinated and decaffeinated coffee and black tea.  Regular or low-calorie carbonated beverages or energy drinks (caffeine-free carbonated beverages are allowed).   Strong spices, such as black pepper, white pepper, red pepper, cayenne, curry powder, and chili powder.  Peppermint or spearmint.  Chocolate.  High-fat foods, including meats and fried foods. Extra added fats including oils, butter, salad dressings, and nuts. Limit these to less than 8 tsp per day.  Fruits and vegetables if they are not tolerated, such as citrus fruits or tomatoes.  Alcohol.  Any food that seems to aggravate your condition. If you have questions regarding your diet, call your caregiver or a registered dietitian. OTHER THINGS THAT MAY HELP GERD INCLUDE:   Eating your meals slowly, in a relaxed setting.  Eating 5 to 6 small meals per day instead of 3 large meals.  Eliminating food for a period of time if it causes distress.  Not lying down until 3 hours after eating a  meal.  Keeping the head of your bed raised 6 to 9 inches (15 to 23 cm) by using a foam wedge or blocks under the legs of the bed. Lying flat may make symptoms worse.  Being physically active. Weight loss may be helpful in reducing reflux in overweight or obese adults.  Wear loose fitting clothing EXAMPLE MEAL PLAN This meal plan is approximately 2,000 calories based on [email or web address] meal planning guidelines. Breakfast   cup cooked oatmeal.  1 cup strawberries.  1 cup low-fat milk.  1 oz almonds. Snack  1 cup cucumber slices.  6 oz yogurt (made from low-fat or fat-free milk). Lunch  2 slice whole-wheat bread.  2 oz sliced [place].  2 tsp mayonnaise.  1 cup blueberries.  1 cup snap peas. Snack  6 whole-wheat crackers.  1 oz string cheese. Dinner   cup brown rice.  1 cup mixed veggies.  1 tsp olive oil.  3 oz grilled fish. Document Released: 06/30/2005 Document Revised: 09/22/2011 Document Reviewed: 05/16/2011 [facility] Patient Information 2014 [place], [place]. Preventive Care for Adults, Female A healthy lifestyle and preventive care can promote health and wellness. Preventive health guidelines for women include the following key practices.  A routine yearly physical is a good way to check with your health care provider about your health and preventive screening. It is a chance to share any concerns and updates on your health and to receive a thorough exam.  Visit your dentist for a routine exam and preventive care every 6 months. Brush your teeth twice a day  and floss once a day. Good oral hygiene prevents tooth decay and gum disease.  The frequency of eye exams is based on your age, health, family medical history, use of contact lenses, and other factors. Follow your health care provider's recommendations for frequency of eye exams.  Eat a healthy diet. Foods like vegetables, fruits, whole grains, low-fat dairy products, and lean protein foods contain  the nutrients you need without too many calories. Decrease your intake of foods high in solid fats, added sugars, and salt. Eat the right amount of calories for you.Get information about a proper diet from your health care provider, if necessary.  Regular physical exercise is one of the most important things you can do for your health. Most adults should get at least 150 minutes of moderate-intensity exercise (any activity that increases your heart rate and causes you to sweat) each week. In addition, most adults need muscle-strengthening exercises on 2 or more days a week.  Maintain a healthy weight. The body mass index (BMI) is a screening tool to identify possible weight problems. It provides an estimate of body fat based on height and weight. Your health care provider can find your BMI, and can help you achieve or maintain a healthy weight.For adults 20 years and older:  A BMI below 18.5 is considered underweight.  A BMI of 18.5 to 24.9 is normal.  A BMI of 25 to 29.9 is considered overweight.  A BMI of 30 and above is considered obese.  Maintain normal blood lipids and cholesterol levels by exercising and minimizing your intake of saturated fat. Eat a balanced diet with plenty of fruit and vegetables. Blood tests for lipids and cholesterol should begin at age 64 and be repeated every 5 years. If your lipid or cholesterol levels are high, you are over 50, or you are at high risk for heart disease, you may need your cholesterol levels checked more frequently.Ongoing high lipid and cholesterol levels should be treated with medicines if diet and exercise are not working.  If you smoke, find out from your health care provider how to quit. If you do not use tobacco, do not start.  Lung cancer screening is recommended for adults aged 51 80 years who are at high risk for developing lung cancer because of a history of smoking. A yearly low-dose CT scan of the lungs is recommended for people who have  at least a 30-pack-year history of smoking and are a current smoker or have quit within the past 15 years. A pack year of smoking is smoking an average of 1 pack of cigarettes a day for 1 year (for example: 1 pack a day for 30 years or 2 packs a day for 15 years). Yearly screening should continue until the smoker has stopped smoking for at least 15 years. Yearly screening should be stopped for people who develop a health problem that would prevent them from having lung cancer treatment.  If you are pregnant, do not drink alcohol. If you are breastfeeding, be very cautious about drinking alcohol. If you are not pregnant and choose to drink alcohol, do not have more than 1 drink per day. One drink is considered to be 12 ounces (355 mL) of beer, 5 ounces (148 mL) of wine, or 1.5 ounces (44 mL) of liquor.  Avoid use of street drugs. Do not share needles with anyone. Ask for help if you need support or instructions about stopping the use of drugs.  High blood pressure causes heart disease  and increases the risk of stroke. Your blood pressure should be checked at least every 1 to 2 years. Ongoing high blood pressure should be treated with medicines if weight loss and exercise do not work.  If you are 51 64 years old, ask your health care provider if you should take aspirin to prevent strokes.  Diabetes screening involves taking a blood sample to check your fasting blood sugar level. This should be done once every 3 years, after age 29, if you are within normal weight and without risk factors for diabetes. Testing should be considered at a younger age or be carried out more frequently if you are overweight and have at least 1 risk factor for diabetes.  Breast cancer screening is essential preventive care for women. You should practice "breast self-awareness." This means understanding the normal appearance and feel of your breasts and may include breast self-examination. Any changes detected, no matter how  small, should be reported to a health care provider. Women in their 68s and 30s should have a clinical breast exam (CBE) by a health care provider as part of a regular health exam every 1 to 3 years. After age 62, women should have a CBE every year. Starting at age 84, women should consider having a mammogram (breast X-ray test) every year. Women who have a family history of breast cancer should talk to their health care provider about genetic screening. Women at a high risk of breast cancer should talk to their health care providers about having an MRI and a mammogram every year.  Breast cancer gene (BRCA)-related cancer risk assessment is recommended for women who have family members with BRCA-related cancers. BRCA-related cancers include breast, ovarian, tubal, and peritoneal cancers. Having family members with these cancers may be associated with an increased risk for harmful changes (mutations) in the breast cancer genes BRCA1 and BRCA2. Results of the assessment will determine the need for genetic counseling and BRCA1 and BRCA2 testing.  The Pap test is a screening test for cervical cancer. A Pap test can show cell changes on the cervix that might become cervical cancer if left untreated. A Pap test is a procedure in which cells are obtained and examined from the lower end of the uterus (cervix).  Women should have a Pap test starting at age 11.  Between ages 35 and 13, Pap tests should be repeated every 2 years.  Beginning at age 81, you should have a Pap test every 3 years as long as the past 3 Pap tests have been normal.  Some women have medical problems that increase the chance of getting cervical cancer. Talk to your health care provider about these problems. It is especially important to talk to your health care provider if a new problem develops soon after your last Pap test. In these cases, your health care provider may recommend more frequent screening and Pap tests.  The above  recommendations are the same for women who have or have not gotten the vaccine for human papillomavirus (HPV).  If you had a hysterectomy for a problem that was not cancer or a condition that could lead to cancer, then you no longer need Pap tests. Even if you no longer need a Pap test, a regular exam is a good idea to make sure no other problems are starting.  If you are between ages 80 and 79 years, and you have had normal Pap tests going back 10 years, you no longer need Pap tests. Even if you  no longer need a Pap test, a regular exam is a good idea to make sure no other problems are starting.  If you have had past treatment for cervical cancer or a condition that could lead to cancer, you need Pap tests and screening for cancer for at least 20 years after your treatment.  If Pap tests have been discontinued, risk factors (such as a new sexual partner) need to be reassessed to determine if screening should be resumed.  The HPV test is an additional test that may be used for cervical cancer screening. The HPV test looks for the virus that can cause the cell changes on the cervix. The cells collected during the Pap test can be tested for HPV. The HPV test could be used to screen women aged 38 years and older, and should be used in women of any age who have unclear Pap test results. After the age of 10, women should have HPV testing at the same frequency as a Pap test.  Colorectal cancer can be detected and often prevented. Most routine colorectal cancer screening begins at the age of 85 years and continues through age 56 years. However, your health care provider may recommend screening at an earlier age if you have risk factors for colon cancer. On a yearly basis, your health care provider may provide home test kits to check for hidden blood in the stool. Use of a small camera at the end of a tube, to directly examine the colon (sigmoidoscopy or colonoscopy), can detect the earliest forms of colorectal  cancer. Talk to your health care provider about this at age 49, when routine screening begins. Direct exam of the colon should be repeated every 5 10 years through age 6 years, unless early forms of pre-cancerous polyps or small growths are found.  People who are at an increased risk for hepatitis B should be screened for this virus. You are considered at high risk for hepatitis B if:  You were born in a country where hepatitis B occurs often. Talk with your health care provider about which countries are considered high risk.  Your parents were born in a high-risk country and you have not received a shot to protect against hepatitis B (hepatitis B vaccine).  You have HIV or AIDS.  You use needles to inject street drugs.  You live with, or have sex with, someone who has Hepatitis B.  You get hemodialysis treatment.  You take certain medicines for conditions like cancer, organ transplantation, and autoimmune conditions.  Hepatitis C blood testing is recommended for all people born from [birth year] through 1966 and any individual with known risks for hepatitis C.  Practice safe sex. Use condoms and avoid high-risk sexual practices to reduce the spread of sexually transmitted infections (STIs). STIs include gonorrhea, chlamydia, syphilis, trichomonas, herpes, HPV, and human immunodeficiency virus (HIV). Herpes, HIV, and HPV are viral illnesses that have no cure. They can result in disability, cancer, and death. Sexually active women aged 42 years and younger should be checked for chlamydia. Older women with new or multiple partners should also be tested for chlamydia. Testing for other STIs is recommended if you are sexually active and at increased risk.  Osteoporosis is a disease in which the bones lose minerals and strength with aging. This can result in serious bone fractures or breaks. The risk of osteoporosis can be identified using a bone density scan. Women ages 34 years and over and women at  risk for fractures or osteoporosis  should discuss screening with their health care providers. Ask your health care provider whether you should take a calcium supplement or vitamin D to reduce the rate of osteoporosis.  Menopause can be associated with physical symptoms and risks. Hormone replacement therapy is available to decrease symptoms and risks. You should talk to your health care provider about whether hormone replacement therapy is right for you.  Use sunscreen. Apply sunscreen liberally and repeatedly throughout the day. You should seek shade when your shadow is shorter than you. Protect yourself by wearing long sleeves, pants, a wide-brimmed hat, and sunglasses year round, whenever you are outdoors.  Once a month, do a whole body skin exam, using a mirror to look at the skin on your back. Tell your health care provider of new moles, moles that have irregular borders, moles that are larger than a pencil eraser, or moles that have changed in shape or color.  Stay current with required vaccines (immunizations).  Influenza vaccine. All adults should be immunized every year.  Tetanus, diphtheria, and acellular pertussis (Td, Tdap) vaccine. Pregnant women should receive 1 dose of Tdap vaccine during each pregnancy. The dose should be obtained regardless of the length of time since the last dose. Immunization is preferred during the 27th 36th week of gestation. An adult who has not previously received Tdap or who does not know her vaccine status should receive 1 dose of Tdap. This initial dose should be followed by tetanus and diphtheria toxoids (Td) booster doses every 10 years. Adults with an unknown or incomplete history of completing a 3-dose immunization series with Td-containing vaccines should begin or complete a primary immunization series including a Tdap dose. Adults should receive a Td booster every 10 years.  Varicella vaccine. An adult without evidence of immunity to varicella should  receive 2 doses or a second dose if she has previously received 1 dose. Pregnant females who do not have evidence of immunity should receive the first dose after pregnancy. This first dose should be obtained before leaving the health care facility. The second dose should be obtained 4 8 weeks after the first dose.  Human papillomavirus (HPV) vaccine. Females aged 57 26 years who have not received the vaccine previously should obtain the 3-dose series. The vaccine is not recommended for use in pregnant females. However, pregnancy testing is not needed before receiving a dose. If a female is found to be pregnant after receiving a dose, no treatment is needed. In that case, the remaining doses should be delayed until after the pregnancy. Immunization is recommended for any person with an immunocompromised condition through the age of 26 years if she did not get any or all doses earlier. During the 3-dose series, the second dose should be obtained 4 8 weeks after the first dose. The third dose should be obtained 24 weeks after the first dose and 16 weeks after the second dose.  Zoster vaccine. One dose is recommended for adults aged 86 years or older unless certain conditions are present.  Measles, mumps, and rubella (MMR) vaccine. Adults born before [birth year] generally are considered immune to measles and mumps. Adults born in [birth year] or later should have 1 or more doses of MMR vaccine unless there is a contraindication to the vaccine or there is laboratory evidence of immunity to each of the three diseases. A routine second dose of MMR vaccine should be obtained at least 28 days after the first dose for students attending postsecondary schools, health care workers, or international travelers.  People who received inactivated measles vaccine or an unknown type of measles vaccine during 1963 1967 should receive 2 doses of MMR vaccine. People who received inactivated mumps vaccine or an unknown type of mumps vaccine before  1979 and are at high risk for mumps infection should consider immunization with 2 doses of MMR vaccine. For females of childbearing age, rubella immunity should be determined. If there is no evidence of immunity, females who are not pregnant should be vaccinated. If there is no evidence of immunity, females who are pregnant should delay immunization until after pregnancy. Unvaccinated health care workers born before [birth year] who lack laboratory evidence of measles, mumps, or rubella immunity or laboratory confirmation of disease should consider measles and mumps immunization with 2 doses of MMR vaccine or rubella immunization with 1 dose of MMR vaccine.  Pneumococcal 13-valent conjugate (PCV13) vaccine. When indicated, a person who is uncertain of her immunization history and has no record of immunization should receive the PCV13 vaccine. An adult aged 57 years or older who has certain medical conditions and has not been previously immunized should receive 1 dose of PCV13 vaccine. This PCV13 should be followed with a dose of pneumococcal polysaccharide (PPSV23) vaccine. The PPSV23 vaccine dose should be obtained at least 8 weeks after the dose of PCV13 vaccine. An adult aged 35 years or older who has certain medical conditions and previously received 1 or more doses of PPSV23 vaccine should receive 1 dose of PCV13. The PCV13 vaccine dose should be obtained 1 or more years after the last PPSV23 vaccine dose.  Pneumococcal polysaccharide (PPSV23) vaccine. When PCV13 is also indicated, PCV13 should be obtained first. All adults aged 81 years and older should be immunized. An adult younger than age 18 years who has certain medical conditions should be immunized. Any person who resides in a nursing home or long-term care facility should be immunized. An adult smoker should be immunized. People with an immunocompromised condition and certain other conditions should receive both PCV13 and PPSV23 vaccines. People with human  immunodeficiency virus (HIV) infection should be immunized as soon as possible after diagnosis. Immunization during chemotherapy or radiation therapy should be avoided. Routine use of PPSV23 vaccine is not recommended for American Indians, [place] Natives, or people younger than 65 years unless there are medical conditions that require PPSV23 vaccine. When indicated, people who have unknown immunization and have no record of immunization should receive PPSV23 vaccine. One-time revaccination 5 years after the first dose of PPSV23 is recommended for people aged 49 64 years who have chronic kidney failure, nephrotic syndrome, asplenia, or immunocompromised conditions. People who received 1 2 doses of PPSV23 before age 72 years should receive another dose of PPSV23 vaccine at age 81 years or later if at least 5 years have passed since the previous dose. Doses of PPSV23 are not needed for people immunized with PPSV23 at or after age 72 years.  Meningococcal vaccine. Adults with asplenia or persistent complement component deficiencies should receive 2 doses of quadrivalent meningococcal conjugate (MenACWY-D) vaccine. The doses should be obtained at least 2 months apart. Microbiologists working with certain meningococcal bacteria, [place] [employer] recruits, people at risk during an outbreak, and people who travel to or live in countries with a high rate of meningitis should be immunized. A first-year college student up through age 26 years who is living in a residence hall should receive a dose if she did not receive a dose on or after her 16th birthday. Adults who have certain high-risk  conditions should receive one or more doses of vaccine.  Hepatitis A vaccine. Adults who wish to be protected from this disease, have certain high-risk conditions, work with hepatitis A-infected animals, work in hepatitis A research labs, or travel to or work in countries with a high rate of hepatitis A should be immunized. Adults who were  previously unvaccinated and who anticipate close contact with an international adoptee during the first 60 days after arrival in the [place] from a country with a high rate of hepatitis A should be immunized.  Hepatitis B vaccine. Adults who wish to be protected from this disease, have certain high-risk conditions, may be exposed to blood or other infectious body fluids, are household contacts or sex partners of hepatitis B positive people, are clients or workers in certain care facilities, or travel to or work in countries with a high rate of hepatitis B should be immunized.  Haemophilus influenzae type b (Hib) vaccine. A previously unvaccinated person with asplenia or sickle cell disease or having a scheduled splenectomy should receive 1 dose of Hib vaccine. Regardless of previous immunization, a recipient of a hematopoietic stem cell transplant should receive a 3-dose series 6 12 months after her successful transplant. Hib vaccine is not recommended for adults with HIV infection. Preventive Services / Frequency   Ages 47 to 64years  Blood pressure check.** / Every 1 to 2 years.  Lipid and cholesterol check.** / Every 5 years beginning at age 30 years.  Lung cancer screening. / Every year if you are aged 33 80 years and have a 30-pack-year history of smoking and currently smoke or have quit within the past 15 years. Yearly screening is stopped once you have quit smoking for at least 15 years or develop a health problem that would prevent you from having lung cancer treatment.  Clinical breast exam.** / Every year after age 75 years.  BRCA-related cancer risk assessment.** / For women who have family members with a BRCA-related cancer (breast, ovarian, tubal, or peritoneal cancers).  Mammogram.** / Every year beginning at age 11 years and continuing for as long as you are in good health. Consult with your health care provider.  Pap test.** / Every 3 years starting at age 47 years  through age 80 or 14 years with a history of 3 consecutive normal Pap tests.  HPV screening.** / Every 3 years from ages 75 years through ages 72 to 38 years with a history of 3 consecutive normal Pap tests.  Fecal occult blood test (FOBT) of stool. / Every year beginning at age 51 years and continuing until age 68 years. You may not need to do this test if you get a colonoscopy every 10 years.  Flexible sigmoidoscopy or colonoscopy.** / Every 5 years for a flexible sigmoidoscopy or every 10 years for a colonoscopy beginning at age 19 years and continuing until age 51 years.  Hepatitis C blood test.** / For all people born from [birth year] through 1966 and any individual with known risks for hepatitis C.  Skin self-exam. / Monthly.  Influenza vaccine. / Every year.  Tetanus, diphtheria, and acellular pertussis (Tdap/Td) vaccine.** / Consult your health care provider. Pregnant women should receive 1 dose of Tdap vaccine during each pregnancy. 1 dose of Td every 10 years.  Varicella vaccine.** / Consult your health care provider. Pregnant females who do not have evidence of immunity should receive the first dose after pregnancy.  Zoster vaccine.** / 1 dose for adults aged 72 years or  older.  Measles, mumps, rubella (MMR) vaccine.** / You need at least 1 dose of MMR if you were born in 1957 or later. You may also need a 2nd dose. For females of childbearing age, rubella immunity should be determined. If there is no evidence of immunity, females who are not pregnant should be vaccinated. If there is no evidence of immunity, females who are pregnant should delay immunization until after pregnancy.  Pneumococcal 13-valent conjugate (PCV13) vaccine.** / Consult your health care provider.  Pneumococcal polysaccharide (PPSV23) vaccine.** / 1 to 2 doses if you smoke cigarettes or if you have certain conditions.  Meningococcal vaccine.** / Consult your health care provider.  Hepatitis A vaccine.** /  Consult your health care provider.  Hepatitis B vaccine.** / Consult your health care provider.  Haemophilus influenzae type b (Hib) vaccine.** / Consult your health care provider. Ages 70 years and over  Blood pressure check.** / Every 1 to 2 years.  Lipid and cholesterol check.** / Every 5 years beginning at age 43 years.  Lung cancer screening. / Every year if you are aged 36 80 years and have a 30-pack-year history of smoking and currently smoke or have quit within the past 15 years. Yearly screening is stopped once you have quit smoking for at least 15 years or develop a health problem that would prevent you from having lung cancer treatment.  Clinical breast exam.** / Every year after age 35 years.  BRCA-related cancer risk assessment.** / For women who have family members with a BRCA-related cancer (breast, ovarian, tubal, or peritoneal cancers).  Mammogram.** / Every year beginning at age 49 years and continuing for as long as you are in good health. Consult with your health care provider.  Pap test.** / Every 3 years starting at age 52 years through age 63 or 57 years with 3 consecutive normal Pap tests. Testing can be stopped between 65 and 70 years with 3 consecutive normal Pap tests and no abnormal Pap or HPV tests in the past 10 years.  HPV screening.** / Every 3 years from ages 7 years through ages 61 or 30 years with a history of 3 consecutive normal Pap tests. Testing can be stopped between 65 and 70 years with 3 consecutive normal Pap tests and no abnormal Pap or HPV tests in the past 10 years.  Fecal occult blood test (FOBT) of stool. / Every year beginning at age 73 years and continuing until age 68 years. You may not need to do this test if you get a colonoscopy every 10 years.  Flexible sigmoidoscopy or colonoscopy.** / Every 5 years for a flexible sigmoidoscopy or every 10 years for a colonoscopy beginning at age 1 years and continuing until age 90  years.  Hepatitis C blood test.** / For all people born from [birth year] through 1966 and any individual with known risks for hepatitis C.  Osteoporosis screening.** / A one-time screening for women ages 61 years and over and women at risk for fractures or osteoporosis.  Skin self-exam. / Monthly.  Influenza vaccine. / Every year.  Tetanus, diphtheria, and acellular pertussis (Tdap/Td) vaccine.** / 1 dose of Td every 10 years.  Varicella vaccine.** / Consult your health care provider.  Zoster vaccine.** / 1 dose for adults aged 41 years or older.  Pneumococcal 13-valent conjugate (PCV13) vaccine.** / Consult your health care provider.  Pneumococcal polysaccharide (PPSV23) vaccine.** / 1 dose for all adults aged 75 years and older.  Meningococcal vaccine.** / Consult your health  care provider.  Hepatitis A vaccine.** / Consult your health care provider.  Hepatitis B vaccine.** / Consult your health care provider.  Haemophilus influenzae type b (Hib) vaccine.** / Consult your health care provider. ** Family history and personal history of risk and conditions may change your health care provider's recommendations. Document Released: 08/26/2001 Document Revised: 04/20/2013 Document Reviewed: 11/25/2010 [facility] Patient Information 2014 [place], [place].

## 2013-07-25 NOTE — Progress Notes (Signed)
Chief Complaint  Patient presents with  . Annual Exam    HPI: Patient comes in today for [place] visit  Since her last visit she did see a pulmonary doctor in regard to her thorax pain felt to be reflux related and was put on Prilosec twice a day which helped her symptoms. However when she tries to go off her symptoms come back these include back pain throat clearing. Tries to do healthy diet this. Has stopped all of her vitamin since last time and just eating regular food. Asks about a regular multivitamin vitamin D.   Health Maintenance  Topic Date Due  . Influenza Vaccine  02/11/2014  . Pap Smear  03/26/2014  . Mammogram  10/15/2014  . Colonoscopy  06/15/2016  . Tetanus/tdap  07/14/2016  . Zostavax  Completed   Health Maintenance Review  ROS:  GEN/ HEENT: No fever, significant weight changes sweats headaches vision problems hearing changes, CV/ PULM; No chest pain shortness of breath cough, syncope,edema  change in exercise tolerance. GI /GU: No adominal pain, vomiting, change in bowel habits. No blood in the stool. No significant GU symptoms.x  Has intermittent vaginal discharge that is green no itching or burning denies concernrisk of ST I SKIN/HEME: ,no acute skin rashes suspicious lesions or bleeding. No lymphadenopathy, nodules, masses.  NEURO/ PSYCH:  No neurologic signs such as weakness numbness. No depression anxiety. Back with radiating? To [place] toes at times  IMM/ Allergy: No unusual infections.  Allergy .   REST of 12 system review negative except as per HPI   Past Medical History  Diagnosis Date  . Osteoporosis     by dexa 2009 had se of fosamax  . Hx MRSA infection   . Abnormal thyroid blood test     rx wiht med briefly resolved no sx     Family History  Problem Relation Age of Onset  . Hyperlipidemia Mother   . Heart disease Mother   . Hypertension Mother   . Cancer Father     History   Social History  . Marital Status: Married     Spouse Name: N/A    Number of Children: N/A  . Years of Education: N/A   Social History Main Topics  . Smoking status: Never Smoker   . Smokeless tobacco: None  . Alcohol Use: No  . Drug Use: No  . Sexual Activity:    Other Topics Concern  . 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    Outpatient Encounter Prescriptions as of 07/25/2013  Medication Sig  . metroNIDAZOLE (FLAGYL) 500 MG tablet Take 1 tablet (500 mg total) by mouth 2 (two) times daily.  . [DISCONTINUED] aspirin 81 MG tablet Take 81 mg by mouth daily.      EXAM:  BP 100/76  Pulse 97  Temp(Src) 98.4 F (36.9 C) (Oral)  Ht 5' 5.25" (1.657 m)  Wt 140 lb (63.504 kg)  BMI 23.13 kg/m2  SpO2 98%  Body mass index is 23.13 kg/(m^2).  Physical Exam: Vital signs reviewed YIR:SWNI is a well-developed well-nourished alert cooperative   female who appears her stated age in no acute  distress.  HEENT: normocephalic atraumatic , Eyes: PERRL EOM's full, conjunctiva clear, Nares: paten,t no deformity discharge or tenderness., Ears: no deformity EAC's clear TMs with normal landmarks. Mouth: clear OP, no lesions, edema.  Moist mucous membranes. Dentition in adequate repair. NECK: supple without masses, thyromegaly or bruits. CHEST/PULM:  Clear to auscultation and percussion breath sounds equal no wheeze , rales or rhonchi. No chest wall deformities or tenderness. Breast: normal by inspection . No dimpling, discharge, masses, tenderness or discharge . CV: PMI is nondisplaced, S1 S2 no gallops, murmurs, rubs. Peripheral pulses are full without delay.No JVD .  ABDOMEN: Bowel sounds normal nontender  No guard or rebound, no hepato splenomegal no CVA tenderness.   No hernia. Extremtities:  No clubbing cyanosis or edema, no acute joint swelling or redness no focal atrophy NEURO:  Oriented x3, cranial nerves 3-12 appear to be intact, no obvious focal weakness,gait within normal limits no abnormal reflexes or asymmetrical SKIN: No acute rashes normal turgor, color, no bruising or petechiae. PSYCH: Oriented, good eye contact, no obvious depression anxiety, cognition and judgment appear normal. LN: no cervical axillary inguinal adenopathy Pelvic: NL ext GU, labia clear without lesions or rash . Vagina no lesions  Red pink  Mild white dc .Cervix: clear  UTERUS: Neg CMT Adnexa:  clear no masses . PAP done rectal no masses heme neg    Lab Results  Component Value Date   WBC 4.5 07/20/2013   HGB 13.7 07/20/2013   HCT 40.6 07/20/2013   PLT 209.0 07/20/2013   GLUCOSE 89 07/20/2013   CHOL 237* 07/20/2013   TRIG 133.0 07/20/2013   HDL 55.30 07/20/2013   LDLDIRECT 147.3 07/20/2013   ALT 19 07/20/2013   AST 23 07/20/2013   NA 140 07/20/2013   K 4.7 07/20/2013   CL 103 07/20/2013   CREATININE 0.7 07/20/2013   BUN 21 07/20/2013   CO2 29 07/20/2013   TSH 3.79 07/20/2013    ASSESSMENT AND PLAN:  Discussed the following assessment and plan:  Visit for preventive health examination - Plan: PAP [[place]]  Other and unspecified hyperlipidemia  GERD (gastroesophageal reflux disease) - with back pain radiation ongoing ? need for EGD - Plan: Ambulatory referral to Gastroenterology  Chronic throat clearing - Plan: Ambulatory referral to Gastroenterology  Vaginal discharge - check for bv tr yeast  consider empiric  rx  - Plan: PAP [[place]]  Routine gynecological examination - Plan: PAP [[place]]  Patient Care Team: Burnis Medin, MD as PCP - General Ladene Artist, MD (Gastroenterology) Patient Instructions   Continue lifestyle intervention healthy eating and exercise . gerd diet   Will refer to GI .  Will let you know  Pap results. Poss you have bacterial vagenosis   . Can treat with flagyl  Or wait for lab to come back .  Wt Readings from Last 3 Encounters:  07/25/13 140 lb (63.504 kg)  11/26/12 138 lb (62.596 kg)  08/17/12 139 lb (63.05 kg)   Diet for Gastroesophageal Reflux Disease, Adult Reflux (acid reflux) is when acid from your stomach flows up into the esophagus. When acid comes in contact with the esophagus, the acid causes irritation and soreness (inflammation) in the esophagus. When reflux happens often or so severely that it causes damage to the esophagus, it is called gastroesophageal reflux disease (GERD). Nutrition therapy can help ease the discomfort of GERD. FOODS OR DRINKS TO AVOID OR LIMIT  Smoking or chewing tobacco. Nicotine is one of the  most potent stimulants to acid production in the gastrointestinal tract.  Caffeinated and decaffeinated coffee and black tea.  Regular or low-calorie carbonated beverages or energy drinks (caffeine-free carbonated beverages are allowed).   Strong spices, such as black pepper, white pepper, red pepper, cayenne, curry powder, and chili powder.  Peppermint or spearmint.  Chocolate.  High-fat foods, including meats and fried foods. Extra added fats including oils, butter, salad dressings, and nuts. Limit these to less than 8 tsp per day.  Fruits and vegetables if they are not tolerated, such as citrus fruits or tomatoes.  Alcohol.  Any food that seems to aggravate your condition. If you have questions regarding your diet, call your caregiver or a registered dietitian. OTHER THINGS THAT MAY HELP GERD INCLUDE:   Eating your meals slowly, in a relaxed setting.  Eating 5 to 6 small meals per day instead of 3 large meals.  Eliminating food for a period of time if it causes distress.  Not lying down until 3 hours after eating a meal.  Keeping the head of your bed raised 6 to 9 inches (15 to 23 cm) by using a foam wedge or blocks under the legs of the bed. Lying flat may make symptoms  worse.  Being physically active. Weight loss may be helpful in reducing reflux in overweight or obese adults.  Wear loose fitting clothing EXAMPLE MEAL PLAN This meal plan is approximately 2,000 calories based on [email or web address] meal planning guidelines. Breakfast   cup cooked oatmeal.  1 cup strawberries.  1 cup low-fat milk.  1 oz almonds. Snack  1 cup cucumber slices.  6 oz yogurt (made from low-fat or fat-free milk). Lunch  2 slice whole-wheat bread.  2 oz sliced [place].  2 tsp mayonnaise.  1 cup blueberries.  1 cup snap peas. Snack  6 whole-wheat crackers.  1 oz string cheese. Dinner   cup brown rice.  1 cup mixed veggies.  1 tsp olive oil.  3 oz grilled fish. Document Released: 06/30/2005 Document Revised: 09/22/2011 Document Reviewed: 05/16/2011 [facility] Patient Information 2014 [place], [place]. Preventive Care for Adults, Female A healthy lifestyle and preventive care can promote health and wellness. Preventive health guidelines for women include the following key practices.  A routine yearly physical is a good way to check with your health care provider about your health and preventive screening. It is a chance to share any concerns and updates on your health and to receive a thorough exam.  Visit your dentist for a routine exam and preventive care every 6 months. Brush your teeth twice a day and floss once a day. Good oral hygiene prevents tooth decay and gum disease.  The frequency of eye exams is based on your age, health, family medical history, use of contact lenses, and other factors. Follow your health care provider's recommendations for frequency of eye exams.  Eat a healthy diet. Foods like vegetables, fruits, whole grains, low-fat dairy products, and lean protein foods contain the nutrients you need without too many calories. Decrease your intake of foods high in solid fats, added sugars, and salt. Eat the right amount of calories for  you.Get information about a proper diet from your health care provider, if necessary.  Regular physical exercise is one of the most important things you can do for your health. Most adults should get at least 150 minutes of moderate-intensity exercise (any activity that increases your heart rate and causes you to sweat) each week. In addition, most adults need muscle-strengthening exercises  on 2 or more days a week.  Maintain a healthy weight. The body mass index (BMI) is a screening tool to identify possible weight problems. It provides an estimate of body fat based on height and weight. Your health care provider can find your BMI, and can help you achieve or maintain a healthy weight.For adults 20 years and older:  A BMI below 18.5 is considered underweight.  A BMI of 18.5 to 24.9 is normal.  A BMI of 25 to 29.9 is considered overweight.  A BMI of 30 and above is considered obese.  Maintain normal blood lipids and cholesterol levels by exercising and minimizing your intake of saturated fat. Eat a balanced diet with plenty of fruit and vegetables. Blood tests for lipids and cholesterol should begin at age 32 and be repeated every 5 years. If your lipid or cholesterol levels are high, you are over 50, or you are at high risk for heart disease, you may need your cholesterol levels checked more frequently.Ongoing high lipid and cholesterol levels should be treated with medicines if diet and exercise are not working.  If you smoke, find out from your health care provider how to quit. If you do not use tobacco, do not start.  Lung cancer screening is recommended for adults aged 71 80 years who are at high risk for developing lung cancer because of a history of smoking. A yearly low-dose CT scan of the lungs is recommended for people who have at least a 30-pack-year history of smoking and are a current smoker or have quit within the past 15 years. A pack year of smoking is smoking an average of 1  pack of cigarettes a day for 1 year (for example: 1 pack a day for 30 years or 2 packs a day for 15 years). Yearly screening should continue until the smoker has stopped smoking for at least 15 years. Yearly screening should be stopped for people who develop a health problem that would prevent them from having lung cancer treatment.  If you are pregnant, do not drink alcohol. If you are breastfeeding, be very cautious about drinking alcohol. If you are not pregnant and choose to drink alcohol, do not have more than 1 drink per day. One drink is considered to be 12 ounces (355 mL) of beer, 5 ounces (148 mL) of wine, or 1.5 ounces (44 mL) of liquor.  Avoid use of street drugs. Do not share needles with anyone. Ask for help if you need support or instructions about stopping the use of drugs.  High blood pressure causes heart disease and increases the risk of stroke. Your blood pressure should be checked at least every 1 to 2 years. Ongoing high blood pressure should be treated with medicines if weight loss and exercise do not work.  If you are 6 64 years old, ask your health care provider if you should take aspirin to prevent strokes.  Diabetes screening involves taking a blood sample to check your fasting blood sugar level. This should be done once every 3 years, after age 57, if you are within normal weight and without risk factors for diabetes. Testing should be considered at a younger age or be carried out more frequently if you are overweight and have at least 1 risk factor for diabetes.  Breast cancer screening is essential preventive care for women. You should practice "breast self-awareness." This means understanding the normal appearance and feel of your breasts and may include breast self-examination. Any changes detected, no  matter how small, should be reported to a health care provider. Women in their 52s and 30s should have a clinical breast exam (CBE) by a health care provider as part of a  regular health exam every 1 to 3 years. After age 66, women should have a CBE every year. Starting at age 30, women should consider having a mammogram (breast X-ray test) every year. Women who have a family history of breast cancer should talk to their health care provider about genetic screening. Women at a high risk of breast cancer should talk to their health care providers about having an MRI and a mammogram every year.  Breast cancer gene (BRCA)-related cancer risk assessment is recommended for women who have family members with BRCA-related cancers. BRCA-related cancers include breast, ovarian, tubal, and peritoneal cancers. Having family members with these cancers may be associated with an increased risk for harmful changes (mutations) in the breast cancer genes BRCA1 and BRCA2. Results of the assessment will determine the need for genetic counseling and BRCA1 and BRCA2 testing.  The Pap test is a screening test for cervical cancer. A Pap test can show cell changes on the cervix that might become cervical cancer if left untreated. A Pap test is a procedure in which cells are obtained and examined from the lower end of the uterus (cervix).  Women should have a Pap test starting at age 37.  Between ages 73 and 72, Pap tests should be repeated every 2 years.  Beginning at age 73, you should have a Pap test every 3 years as long as the past 3 Pap tests have been normal.  Some women have medical problems that increase the chance of getting cervical cancer. Talk to your health care provider about these problems. It is especially important to talk to your health care provider if a new problem develops soon after your last Pap test. In these cases, your health care provider may recommend more frequent screening and Pap tests.  The above recommendations are the same for women who have or have not gotten the vaccine for human papillomavirus (HPV).  If you had a hysterectomy for a problem that was not  cancer or a condition that could lead to cancer, then you no longer need Pap tests. Even if you no longer need a Pap test, a regular exam is a good idea to make sure no other problems are starting.  If you are between ages 65 and 67 years, and you have had normal Pap tests going back 10 years, you no longer need Pap tests. Even if you no longer need a Pap test, a regular exam is a good idea to make sure no other problems are starting.  If you have had past treatment for cervical cancer or a condition that could lead to cancer, you need Pap tests and screening for cancer for at least 20 years after your treatment.  If Pap tests have been discontinued, risk factors (such as a new sexual partner) need to be reassessed to determine if screening should be resumed.  The HPV test is an additional test that may be used for cervical cancer screening. The HPV test looks for the virus that can cause the cell changes on the cervix. The cells collected during the Pap test can be tested for HPV. The HPV test could be used to screen women aged 77 years and older, and should be used in women of any age who have unclear Pap test results. After the age  of 79, women should have HPV testing at the same frequency as a Pap test.  Colorectal cancer can be detected and often prevented. Most routine colorectal cancer screening begins at the age of 47 years and continues through age 35 years. However, your health care provider may recommend screening at an earlier age if you have risk factors for colon cancer. On a yearly basis, your health care provider may provide home test kits to check for hidden blood in the stool. Use of a small camera at the end of a tube, to directly examine the colon (sigmoidoscopy or colonoscopy), can detect the earliest forms of colorectal cancer. Talk to your health care provider about this at age 42, when routine screening begins. Direct exam of the colon should be repeated every 5 10 years through  age 47 years, unless early forms of pre-cancerous polyps or small growths are found.  People who are at an increased risk for hepatitis B should be screened for this virus. You are considered at high risk for hepatitis B if:  You were born in a country where hepatitis B occurs often. Talk with your health care provider about which countries are considered high risk.  Your parents were born in a high-risk country and you have not received a shot to protect against hepatitis B (hepatitis B vaccine).  You have HIV or AIDS.  You use needles to inject street drugs.  You live with, or have sex with, someone who has Hepatitis B.  You get hemodialysis treatment.  You take certain medicines for conditions like cancer, organ transplantation, and autoimmune conditions.  Hepatitis C blood testing is recommended for all people born from [birth year] through 1966 and any individual with known risks for hepatitis C.  Practice safe sex. Use condoms and avoid high-risk sexual practices to reduce the spread of sexually transmitted infections (STIs). STIs include gonorrhea, chlamydia, syphilis, trichomonas, herpes, HPV, and human immunodeficiency virus (HIV). Herpes, HIV, and HPV are viral illnesses that have no cure. They can result in disability, cancer, and death. Sexually active women aged 53 years and younger should be checked for chlamydia. Older women with new or multiple partners should also be tested for chlamydia. Testing for other STIs is recommended if you are sexually active and at increased risk.  Osteoporosis is a disease in which the bones lose minerals and strength with aging. This can result in serious bone fractures or breaks. The risk of osteoporosis can be identified using a bone density scan. Women ages 46 years and over and women at risk for fractures or osteoporosis should discuss screening with their health care providers. Ask your health care provider whether you should take a calcium supplement  or vitamin D to reduce the rate of osteoporosis.  Menopause can be associated with physical symptoms and risks. Hormone replacement therapy is available to decrease symptoms and risks. You should talk to your health care provider about whether hormone replacement therapy is right for you.  Use sunscreen. Apply sunscreen liberally and repeatedly throughout the day. You should seek shade when your shadow is shorter than you. Protect yourself by wearing long sleeves, pants, a wide-brimmed hat, and sunglasses year round, whenever you are outdoors.  Once a month, do a whole body skin exam, using a mirror to look at the skin on your back. Tell your health care provider of new moles, moles that have irregular borders, moles that are larger than a pencil eraser, or moles that have changed in shape or color.  Stay current with required vaccines (immunizations).  Influenza vaccine. All adults should be immunized every year.  Tetanus, diphtheria, and acellular pertussis (Td, Tdap) vaccine. Pregnant women should receive 1 dose of Tdap vaccine during each pregnancy. The dose should be obtained regardless of the length of time since the last dose. Immunization is preferred during the 27th 36th week of gestation. An adult who has not previously received Tdap or who does not know her vaccine status should receive 1 dose of Tdap. This initial dose should be followed by tetanus and diphtheria toxoids (Td) booster doses every 10 years. Adults with an unknown or incomplete history of completing a 3-dose immunization series with Td-containing vaccines should begin or complete a primary immunization series including a Tdap dose. Adults should receive a Td booster every 10 years.  Varicella vaccine. An adult without evidence of immunity to varicella should receive 2 doses or a second dose if she has previously received 1 dose. Pregnant females who do not have evidence of immunity should receive the first dose after  pregnancy. This first dose should be obtained before leaving the health care facility. The second dose should be obtained 4 8 weeks after the first dose.  Human papillomavirus (HPV) vaccine. Females aged 72 26 years who have not received the vaccine previously should obtain the 3-dose series. The vaccine is not recommended for use in pregnant females. However, pregnancy testing is not needed before receiving a dose. If a female is found to be pregnant after receiving a dose, no treatment is needed. In that case, the remaining doses should be delayed until after the pregnancy. Immunization is recommended for any person with an immunocompromised condition through the age of 101 years if she did not get any or all doses earlier. During the 3-dose series, the second dose should be obtained 4 8 weeks after the first dose. The third dose should be obtained 24 weeks after the first dose and 16 weeks after the second dose.  Zoster vaccine. One dose is recommended for adults aged 71 years or older unless certain conditions are present.  Measles, mumps, and rubella (MMR) vaccine. Adults born before [birth year] generally are considered immune to measles and mumps. Adults born in [birth year] or later should have 1 or more doses of MMR vaccine unless there is a contraindication to the vaccine or there is laboratory evidence of immunity to each of the three diseases. A routine second dose of MMR vaccine should be obtained at least 28 days after the first dose for students attending postsecondary schools, health care workers, or international travelers. People who received inactivated measles vaccine or an unknown type of measles vaccine during 1963 1967 should receive 2 doses of MMR vaccine. People who received inactivated mumps vaccine or an unknown type of mumps vaccine before 1979 and are at high risk for mumps infection should consider immunization with 2 doses of MMR vaccine. For females of childbearing age, rubella immunity should  be determined. If there is no evidence of immunity, females who are not pregnant should be vaccinated. If there is no evidence of immunity, females who are pregnant should delay immunization until after pregnancy. Unvaccinated health care workers born before [birth year] who lack laboratory evidence of measles, mumps, or rubella immunity or laboratory confirmation of disease should consider measles and mumps immunization with 2 doses of MMR vaccine or rubella immunization with 1 dose of MMR vaccine.  Pneumococcal 13-valent conjugate (PCV13) vaccine. When indicated, a person who is uncertain of her immunization history  and has no record of immunization should receive the PCV13 vaccine. An adult aged 75 years or older who has certain medical conditions and has not been previously immunized should receive 1 dose of PCV13 vaccine. This PCV13 should be followed with a dose of pneumococcal polysaccharide (PPSV23) vaccine. The PPSV23 vaccine dose should be obtained at least 8 weeks after the dose of PCV13 vaccine. An adult aged 4 years or older who has certain medical conditions and previously received 1 or more doses of PPSV23 vaccine should receive 1 dose of PCV13. The PCV13 vaccine dose should be obtained 1 or more years after the last PPSV23 vaccine dose.  Pneumococcal polysaccharide (PPSV23) vaccine. When PCV13 is also indicated, PCV13 should be obtained first. All adults aged 41 years and older should be immunized. An adult younger than age 10 years who has certain medical conditions should be immunized. Any person who resides in a nursing home or long-term care facility should be immunized. An adult smoker should be immunized. People with an immunocompromised condition and certain other conditions should receive both PCV13 and PPSV23 vaccines. People with human immunodeficiency virus (HIV) infection should be immunized as soon as possible after diagnosis. Immunization during chemotherapy or radiation therapy should be  avoided. Routine use of PPSV23 vaccine is not recommended for American Indians, [place] Natives, or people younger than 65 years unless there are medical conditions that require PPSV23 vaccine. When indicated, people who have unknown immunization and have no record of immunization should receive PPSV23 vaccine. One-time revaccination 5 years after the first dose of PPSV23 is recommended for people aged 41 64 years who have chronic kidney failure, nephrotic syndrome, asplenia, or immunocompromised conditions. People who received 1 2 doses of PPSV23 before age 16 years should receive another dose of PPSV23 vaccine at age 57 years or later if at least 5 years have passed since the previous dose. Doses of PPSV23 are not needed for people immunized with PPSV23 at or after age 66 years.  Meningococcal vaccine. Adults with asplenia or persistent complement component deficiencies should receive 2 doses of quadrivalent meningococcal conjugate (MenACWY-D) vaccine. The doses should be obtained at least 2 months apart. Microbiologists working with certain meningococcal bacteria, [employer] recruits, people at risk during an outbreak, and people who travel to or live in countries with a high rate of meningitis should be immunized. A first-year college student up through age 27 years who is living in a residence hall should receive a dose if she did not receive a dose on or after her 16th birthday. Adults who have certain high-risk conditions should receive one or more doses of vaccine.  Hepatitis A vaccine. Adults who wish to be protected from this disease, have certain high-risk conditions, work with hepatitis A-infected animals, work in hepatitis A research labs, or travel to or work in countries with a high rate of hepatitis A should be immunized. Adults who were previously unvaccinated and who anticipate close contact with an international adoptee during the first 60 days after arrival in the [place] from a country  with a high rate of hepatitis A should be immunized.  Hepatitis B vaccine. Adults who wish to be protected from this disease, have certain high-risk conditions, may be exposed to blood or other infectious body fluids, are household contacts or sex partners of hepatitis B positive people, are clients or workers in certain care facilities, or travel to or work in countries with a high rate of hepatitis B should be immunized.  Haemophilus  influenzae type b (Hib) vaccine. A previously unvaccinated person with asplenia or sickle cell disease or having a scheduled splenectomy should receive 1 dose of Hib vaccine. Regardless of previous immunization, a recipient of a hematopoietic stem cell transplant should receive a 3-dose series 6 12 months after her successful transplant. Hib vaccine is not recommended for adults with HIV infection. Preventive Services / Frequency   Ages 45 to 64years  Blood pressure check.** / Every 1 to 2 years.  Lipid and cholesterol check.** / Every 5 years beginning at age 31 years.  Lung cancer screening. / Every year if you are aged 90 80 years and have a 30-pack-year history of smoking and currently smoke or have quit within the past 15 years. Yearly screening is stopped once you have quit smoking for at least 15 years or develop a health problem that would prevent you from having lung cancer treatment.  Clinical breast exam.** / Every year after age 63 years.  BRCA-related cancer risk assessment.** / For women who have family members with a BRCA-related cancer (breast, ovarian, tubal, or peritoneal cancers).  Mammogram.** / Every year beginning at age 69 years and continuing for as long as you are in good health. Consult with your health care provider.  Pap test.** / Every 3 years starting at age 1 years through age 13 or 52 years with a history of 3 consecutive normal Pap tests.  HPV screening.** / Every 3 years from ages 19 years through ages 70 to 48 years with a  history of 3 consecutive normal Pap tests.  Fecal occult blood test (FOBT) of stool. / Every year beginning at age 3 years and continuing until age 31 years. You may not need to do this test if you get a colonoscopy every 10 years.  Flexible sigmoidoscopy or colonoscopy.** / Every 5 years for a flexible sigmoidoscopy or every 10 years for a colonoscopy beginning at age 4 years and continuing until age 29 years.  Hepatitis C blood test.** / For all people born from [birth year] through 1966 and any individual with known risks for hepatitis C.  Skin self-exam. / Monthly.  Influenza vaccine. / Every year.  Tetanus, diphtheria, and acellular pertussis (Tdap/Td) vaccine.** / Consult your health care provider. Pregnant women should receive 1 dose of Tdap vaccine during each pregnancy. 1 dose of Td every 10 years.  Varicella vaccine.** / Consult your health care provider. Pregnant females who do not have evidence of immunity should receive the first dose after pregnancy.  Zoster vaccine.** / 1 dose for adults aged 32 years or older.  Measles, mumps, rubella (MMR) vaccine.** / You need at least 1 dose of MMR if you were born in 1957 or later. You may also need a 2nd dose. For females of childbearing age, rubella immunity should be determined. If there is no evidence of immunity, females who are not pregnant should be vaccinated. If there is no evidence of immunity, females who are pregnant should delay immunization until after pregnancy.  Pneumococcal 13-valent conjugate (PCV13) vaccine.** / Consult your health care provider.  Pneumococcal polysaccharide (PPSV23) vaccine.** / 1 to 2 doses if you smoke cigarettes or if you have certain conditions.  Meningococcal vaccine.** / Consult your health care provider.  Hepatitis A vaccine.** / Consult your health care provider.  Hepatitis B vaccine.** / Consult your health care provider.  Haemophilus influenzae type b (Hib) vaccine.** / Consult your health  care provider. Ages 52 years and over  Blood pressure check.** /  Every 1 to 2 years.  Lipid and cholesterol check.** / Every 5 years beginning at age 19 years.  Lung cancer screening. / Every year if you are aged 49 80 years and have a 30-pack-year history of smoking and currently smoke or have quit within the past 15 years. Yearly screening is stopped once you have quit smoking for at least 15 years or develop a health problem that would prevent you from having lung cancer treatment.  Clinical breast exam.** / Every year after age 49 years.  BRCA-related cancer risk assessment.** / For women who have family members with a BRCA-related cancer (breast, ovarian, tubal, or peritoneal cancers).  Mammogram.** / Every year beginning at age 16 years and continuing for as long as you are in good health. Consult with your health care provider.  Pap test.** / Every 3 years starting at age 57 years through age 42 or 9 years with 3 consecutive normal Pap tests. Testing can be stopped between 65 and 70 years with 3 consecutive normal Pap tests and no abnormal Pap or HPV tests in the past 10 years.  HPV screening.** / Every 3 years from ages 7 years through ages 71 or 41 years with a history of 3 consecutive normal Pap tests. Testing can be stopped between 65 and 70 years with 3 consecutive normal Pap tests and no abnormal Pap or HPV tests in the past 10 years.  Fecal occult blood test (FOBT) of stool. / Every year beginning at age 47 years and continuing until age 66 years. You may not need to do this test if you get a colonoscopy every 10 years.  Flexible sigmoidoscopy or colonoscopy.** / Every 5 years for a flexible sigmoidoscopy or every 10 years for a colonoscopy beginning at age 50 years and continuing until age 77 years.  Hepatitis C blood test.** / For all people born from [birth year] through 1966 and any individual with known risks for hepatitis C.  Osteoporosis screening.** / A one-time screening  for women ages 42 years and over and women at risk for fractures or osteoporosis.  Skin self-exam. / Monthly.  Influenza vaccine. / Every year.  Tetanus, diphtheria, and acellular pertussis (Tdap/Td) vaccine.** / 1 dose of Td every 10 years.  Varicella vaccine.** / Consult your health care provider.  Zoster vaccine.** / 1 dose for adults aged 23 years or older.  Pneumococcal 13-valent conjugate (PCV13) vaccine.** / Consult your health care provider.  Pneumococcal polysaccharide (PPSV23) vaccine.** / 1 dose for all adults aged 53 years and older.  Meningococcal vaccine.** / Consult your health care provider.  Hepatitis A vaccine.** / Consult your health care provider.  Hepatitis B vaccine.** / Consult your health care provider.  Haemophilus influenzae type b (Hib) vaccine.** / Consult your health care provider. ** Family history and personal history of risk and conditions may change your health care provider's recommendations. Document Released: 08/26/2001 Document Revised: 04/20/2013 Document Reviewed: 11/25/2010 [facility] Patient Information 2014 [place], [place].     Standley Brooking. Blair Lundeen M.D.   Pre visit review using our clinic review tool, if applicable. No additional management support is needed unless otherwise documented below in the visit note.

## 2013-07-27 NOTE — Progress Notes (Signed)
Quick Note:  Tell patient PAP is normal.hpv negative ______

## 2013-07-28 ENCOUNTER — Encounter: Payer: Self-pay | Admitting: Family Medicine

## 2013-08-25 ENCOUNTER — Encounter: Payer: Self-pay | Admitting: Gastroenterology

## 2013-09-16 ENCOUNTER — Encounter: Payer: Self-pay | Admitting: Internal Medicine

## 2013-11-30 ENCOUNTER — Other Ambulatory Visit: Payer: Self-pay | Admitting: Internal Medicine

## 2013-11-30 DIAGNOSIS — Z1231 Encounter for screening mammogram for malignant neoplasm of breast: Secondary | ICD-10-CM

## 2013-12-01 ENCOUNTER — Ambulatory Visit (HOSPITAL_COMMUNITY)
Admission: RE | Admit: 2013-12-01 | Discharge: 2013-12-01 | Disposition: A | Discharge status: Home / Self Care | Payer: BC Managed Care – PPO | Source: Ambulatory Visit | Attending: Internal Medicine | Admitting: Internal Medicine

## 2013-12-01 DIAGNOSIS — Z1231 Encounter for screening mammogram for malignant neoplasm of breast: Secondary | ICD-10-CM

## 2014-09-20 DIAGNOSIS — H5210 Myopia, unspecified eye: Secondary | ICD-10-CM | POA: Diagnosis not present

## 2014-09-20 DIAGNOSIS — H524 Presbyopia: Secondary | ICD-10-CM | POA: Diagnosis not present

## 2014-09-20 DIAGNOSIS — H2510 Age-related nuclear cataract, unspecified eye: Secondary | ICD-10-CM | POA: Diagnosis not present

## 2014-10-24 ENCOUNTER — Other Ambulatory Visit (INDEPENDENT_AMBULATORY_CARE_PROVIDER_SITE_OTHER): Payer: Commercial Managed Care - HMO

## 2014-10-24 DIAGNOSIS — Z0000 Encounter for general adult medical examination without abnormal findings: Secondary | ICD-10-CM | POA: Diagnosis not present

## 2014-10-24 DIAGNOSIS — M810 Age-related osteoporosis without current pathological fracture: Secondary | ICD-10-CM | POA: Diagnosis not present

## 2014-10-24 LAB — CBC WITH DIFFERENTIAL/PLATELET
BASOS ABS: 0 10*3/uL (ref 0.0–0.1)
BASOS PCT: 0.7 % (ref 0.0–3.0)
EOS ABS: 0.1 10*3/uL (ref 0.0–0.7)
EOS PCT: 2.6 % (ref 0.0–5.0)
HCT: 39.9 % (ref 36.0–46.0)
HEMOGLOBIN: 13.6 g/dL (ref 12.0–15.0)
Lymphocytes Relative: 40.6 % (ref 12.0–46.0)
Lymphs Abs: 1.6 10*3/uL (ref 0.7–4.0)
MCHC: 34.2 g/dL (ref 30.0–36.0)
MCV: 86.3 fl (ref 78.0–100.0)
MONO ABS: 0.4 10*3/uL (ref 0.1–1.0)
Monocytes Relative: 9.5 % (ref 3.0–12.0)
NEUTROS ABS: 1.9 10*3/uL (ref 1.4–7.7)
Neutrophils Relative %: 46.6 % (ref 43.0–77.0)
Platelets: 201 10*3/uL (ref 150.0–400.0)
RBC: 4.62 Mil/uL (ref 3.87–5.11)
RDW: 13.7 % (ref 11.5–15.5)
WBC: 4 10*3/uL (ref 4.0–10.5)

## 2014-10-24 LAB — LIPID PANEL
CHOLESTEROL: 204 mg/dL — AB (ref 0–200)
HDL: 58.5 mg/dL (ref >39.00)
LDL CALC: 124 mg/dL — AB (ref 0–99)
NonHDL: 145.5
TRIGLYCERIDES: 110 mg/dL (ref 0.0–149.0)
Total CHOL/HDL Ratio: 3
VLDL: 22 mg/dL (ref 0.0–40.0)

## 2014-10-24 LAB — TSH: TSH: 3.53 u[IU]/mL (ref 0.35–4.50)

## 2014-10-24 LAB — HEPATIC FUNCTION PANEL
ALBUMIN: 4.5 g/dL (ref 3.5–5.2)
ALK PHOS: 104 U/L (ref 39–117)
ALT: 10 U/L (ref 0–35)
AST: 16 U/L (ref 0–37)
BILIRUBIN DIRECT: 0.1 mg/dL (ref 0.0–0.3)
TOTAL PROTEIN: 7.3 g/dL (ref 6.0–8.3)
Total Bilirubin: 0.5 mg/dL (ref 0.2–1.2)

## 2014-10-24 LAB — BASIC METABOLIC PANEL
BUN: 20 mg/dL (ref 6–23)
CHLORIDE: 105 meq/L (ref 96–112)
CO2: 30 mEq/L (ref 19–32)
Calcium: 10 mg/dL (ref 8.4–10.5)
Creatinine, Ser: 0.71 mg/dL (ref 0.40–1.20)
GFR: 87.77 mL/min (ref >60.00)
Glucose, Bld: 89 mg/dL (ref 70–99)
POTASSIUM: 5.3 meq/L — AB (ref 3.5–5.1)
Sodium: 138 mEq/L (ref 135–145)

## 2014-10-30 ENCOUNTER — Ambulatory Visit (INDEPENDENT_AMBULATORY_CARE_PROVIDER_SITE_OTHER): Payer: Commercial Managed Care - HMO | Admitting: Internal Medicine

## 2014-10-30 ENCOUNTER — Encounter: Payer: Self-pay | Admitting: Internal Medicine

## 2014-10-30 VITALS — BP 122/76 | HR 72 | Temp 98.6°F | Resp 18 | Ht 65.5 in | Wt 132.0 lb

## 2014-10-30 DIAGNOSIS — R202 Paresthesia of skin: Secondary | ICD-10-CM

## 2014-10-30 DIAGNOSIS — Z820 Family history of epilepsy and other diseases of the nervous system: Secondary | ICD-10-CM | POA: Insufficient documentation

## 2014-10-30 DIAGNOSIS — Z0000 Encounter for general adult medical examination without abnormal findings: Secondary | ICD-10-CM

## 2014-10-30 DIAGNOSIS — Z23 Encounter for immunization: Secondary | ICD-10-CM | POA: Diagnosis not present

## 2014-10-30 DIAGNOSIS — M810 Age-related osteoporosis without current pathological fracture: Secondary | ICD-10-CM | POA: Diagnosis not present

## 2014-10-30 DIAGNOSIS — R51 Headache: Secondary | ICD-10-CM | POA: Diagnosis not present

## 2014-10-30 DIAGNOSIS — R519 Headache, unspecified: Secondary | ICD-10-CM | POA: Insufficient documentation

## 2014-10-30 NOTE — Progress Notes (Signed)
Pre visit review using our clinic review tool, if applicable. No additional management support is needed unless otherwise documented below in the visit note. 

## 2014-10-30 NOTE — Patient Instructions (Addendum)
  Continue lifestyle intervention healthy eating and exercise .  Mediterranean diet very healthy.  Will be contacted .  About neurology .    Consult regarding headaches and the tingling on right side of body . Next year get  Pneumovax Consider  another bone density   Consider other meds  Than fosamax .    Healthy lifestyle includes : At least 150 minutes of exercise weeks  , weight at healthy levels, which is usually   BMI 19-25. Avoid trans fats and processed foods;  Increase fresh fruits and veges to 5 servings per day. And avoid sweet beverages including tea and juice. Mediterranean diet with olive oil and nuts have been noted to be heart and brain healthy . Avoid tobacco products . BP control below 140/90 Limit  alcohol to  7 per week for women and 14 servings for men.  Get adequate sleep . Wear seat belts . Don't text and drive .   wellness check in a year or earloer if needed regarding  Headaches and tingling

## 2014-10-30 NOTE — Progress Notes (Signed)
Chief Complaint  Patient presents with  . Annual Exam    [employer], headaches and right sided tingling    HPI: Jenny [name] 64 y.o. comes in today for Preventive Medicare wellness visit . Walking   On treadmill  No major injuries, ed visits ,hospitalizations , new medications since last visit. concersn  Headaches  off na on for yrears now more frequent taking otcs some .  No osa sx . No injury  Also  having  Tingling numbness  Right middel toe and upt to back  And right arm:  no weakness  Comes and goes but motsly there   No weakenss  Worse on treadmill  Had problem with  of right ear felt wet and dizziness then resolved ? If more congested on right but saw ? ent .  Asks a bout alzheimer prevention  Health Maintenance  Topic Date Due  . HIV Screening  09/04/1965  . INFLUENZA VACCINE  02/12/2015  . PNA vac Low Risk Adult (2 of 2 - PPSV23) 10/30/2015  . MAMMOGRAM  12/02/2015  . COLONOSCOPY  06/15/2016  . TETANUS/TDAP  07/14/2016  . DEXA SCAN  Completed  . ZOSTAVAX  Completed   Health Maintenance Review LIFESTYLE:  Exercise:   Walking  [place]  Tobacco/ETS:  no Alcohol: no Sugar beverages:  No to rare  Sleep:  About 6   Drug use: no Bone density:  2009 Colonoscopy: due 2017 MEDICARE DOCUMENT QUESTIONS  TO SCAN     Hearing: ok  Vision:  No limitations at present . Last eye check UTD  Safety:  Has smoke detector and wears seat belts.  No firearms. No excess sun exposure. Sees dentist regularly.  Falls: no  Advance directive :  Reviewed  Has one.  Memory: Felt to be good  , no concern from her or her family.  Depression: No anhedonia unusual crying or depressive symptoms  Nutrition: Eats well balanced diet; adequate calcium and vitamin D. No swallowing chewing problems.  Injury: no major injuries in the last six months.  Other healthcare providers:  Reviewed today .  Social:  Lives with spouse married. No pets.   Preventive parameters:  up-to-date  Reviewed   ADLS:   There are no problems or need for assistance  driving, feeding, obtaining food, dressing, toileting and bathing, managing money using phone. She is independent.  ROS:  GEN/ HEENT: No fever, significant weight changes sweats headaches vision problems hearing changes, CV/ PULM; No chest pain shortness of breath cough, syncope,edema  change in exercise tolerance. GI /GU: No adominal pain, vomiting, change in bowel habits. No blood in the stool. No significant GU symptoms. SKIN/HEME: ,no acute skin rashes suspicious lesions or bleeding. No lymphadenopathy, nodules, masses.  NEURO/ PSYCH:  See above No depression anxiety. IMM/ Allergy: No unusual infections.  Allergy .   REST of 12 system review negative except as per HPI   Past Medical History  Diagnosis Date  . Osteoporosis     by dexa 2009 had se of fosamax  . Hx MRSA infection   . Abnormal thyroid blood test     rx wiht med briefly resolved no sx     Family History  Problem Relation Age of Onset  . Hyperlipidemia Mother   . Heart disease Mother   . Hypertension Mother   . Cancer Father     History   Social History  . Marital Status: Married    Spouse Name: N/A  . Number  of Children: N/A  . Years of Education: N/A   Social History Main Topics  . Smoking status: Never Smoker   . Smokeless tobacco: Not on file  . Alcohol Use: No  . Drug Use: No  . Sexual Activity: Not on file   Other Topics Concern  . 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    Outpatient Encounter Prescriptions as of 10/30/2014  Medication Sig  . [DISCONTINUED] metroNIDAZOLE (FLAGYL) 500 MG tablet Take 1 tablet (500 mg total) by mouth 2 (two) times  daily.    EXAM:  BP 122/76 mmHg  Pulse 72  Temp(Src) 98.6 F (37 C) (Oral)  Resp 18  Ht 5' 5.5" (1.664 m)  Wt 132 lb (59.875 kg)  BMI 21.62 kg/m2  SpO2 98%  Body mass index is 21.62 kg/(m^2).  Physical Exam: Vital signs reviewed XTK:[id number] is a well-developed well-nourished alert cooperative   who appears stated age in no acute distress.  HEENT: normocephalic atraumatic , Eyes: PERRL EOM's full, conjunctiva clear, Nares: paten,t no deformity discharge or tenderness., Ears: no deformity EAC's clear TMs with normal landmarks. Mouth: clear OP, no lesions, edema.  Moist mucous membranes. Dentition in adequate repair. NECK: supple without masses, thyromegaly or bruits. CHEST/PULM:  Clear to auscultation and percussion breath sounds equal no wheeze , rales or rhonchi. No chest wall deformities or tenderness. Breast: normal by inspection . No dimpling, discharge, masses, tenderness or discharge . CV: PMI is nondisplaced, S1 S2 no gallops, murmurs, rubs. Peripheral pulses are full without delay.No JVD .  ABDOMEN: Bowel sounds normal nontender  No guard or rebound, no hepato splenomegal no CVA tenderness.  No hernia. Extremtities:  No clubbing cyanosis or edema, no acute joint swelling or redness no focal atrophy NEURO:  Oriented x3, cranial nerves 3-12 appear to be intact, no obvious focal weakness,gait within normal limits no abnormal reflexes  Dec sense midddle  righ toe SKIN: No acute rashes normal turgor, color, no bruising or petechiae. PSYCH: Oriented, good eye contact, no obvious depression anxiety, cognition and judgment appear normal. LN: no cervical axillary inguinal adenopathy No noted deficits in memory, attention, and speech.   Lab Results  Component Value Date   WBC 4.0 10/24/2014   HGB 13.6 10/24/2014   HCT 39.9 10/24/2014   PLT 201.0 10/24/2014   GLUCOSE 89 10/24/2014   CHOL 204* 10/24/2014   TRIG 110.0 10/24/2014   HDL 58.50 10/24/2014   LDLDIRECT 147.3 07/20/2013     LDLCALC 124* 10/24/2014   ALT 10 10/24/2014   AST 16 10/24/2014   NA 138 10/24/2014   K 5.3* 10/24/2014   CL 105 10/24/2014   CREATININE 0.71 10/24/2014   BUN 20 10/24/2014   CO2 30 10/24/2014   TSH 3.53 10/24/2014    ASSESSMENT AND PLAN:  Discussed the following assessment and plan:  Welcome to Medicare preventive visit - utd  prevnar 46 today   Osteoporosis - had se of fosamax no fracture.  update dexa consdier other optinos such as prolia .  - Plan: DG Bone Density  Tingling - rifght arm and leg middle toe  reassuring exam no neck sx has increasing  recurrent headaches  - Plan: Ambulatory referral to Neurology  Recurrent headache - hx of same and increasing over months  refer to neuro with above  - Plan: Ambulatory referral to Neurology  Family history of Alzheimer's disease - disc prevnetion cv risk reduction avoid head trauma can ask  neuro  also  - Plan: Ambulatory referral to Neurology  Need for prophylactic vaccination against Streptococcus pneumoniae (pneumococcus) - Plan: Pneumococcal conjugate vaccine 13-valent IM  Patient Care Team: Burnis Medin, MD as PCP - General Ladene Artist, MD (Gastroenterology)  Patient Instructions   Continue lifestyle intervention healthy eating and exercise .  Mediterranean diet very healthy.  Will be contacted .  About neurology .    Consult regarding headaches and the tingling on right side of body . Next year get  Pneumovax Consider  another bone density   Consider other meds  Than fosamax .    Healthy lifestyle includes : At least 150 minutes of exercise weeks  , weight at healthy levels, which is usually   BMI 19-25. Avoid trans fats and processed foods;  Increase fresh fruits and veges to 5 servings per day. And avoid sweet beverages including tea and juice. Mediterranean diet with olive oil and nuts have been noted to be heart and brain healthy . Avoid tobacco products . BP control below 140/90 Limit  alcohol to  7  per week for women and 14 servings for men.  Get adequate sleep . Wear seat belts . Don't text and drive .   wellness check in a year or earloer if needed regarding  Headaches and tingling   Mariann Laster K. Panosh M.D.

## 2014-12-04 ENCOUNTER — Encounter: Payer: Self-pay | Admitting: Neurology

## 2014-12-04 ENCOUNTER — Ambulatory Visit (INDEPENDENT_AMBULATORY_CARE_PROVIDER_SITE_OTHER): Payer: Commercial Managed Care - HMO | Admitting: Neurology

## 2014-12-04 VITALS — BP 108/62 | HR 72 | Ht 65.0 in | Wt 129.0 lb

## 2014-12-04 DIAGNOSIS — R202 Paresthesia of skin: Secondary | ICD-10-CM

## 2014-12-04 DIAGNOSIS — R209 Unspecified disturbances of skin sensation: Secondary | ICD-10-CM | POA: Diagnosis not present

## 2014-12-04 LAB — VITAMIN B12

## 2014-12-04 NOTE — Progress Notes (Addendum)
NEUROLOGY CONSULTATION NOTE  Jenny [name] MRN: [id number] DOB: 04-May-1951  Referring provider: Dr. Regis Bill Primary care provider: Dr. Regis Bill  Reason for consult:  paresthesias  HISTORY OF PRESENT ILLNESS: Jenny [name] is a 64 year old left-handed woman who presents for headache and paresthesias.  Records and labs reviewed.  About 3 months ago, she began feeling pins and needles tingling sensation on the right side of her body.  It first started in her middle toe and then involved the entire right leg, arm and torso, up to the right side of the neck.  There was no associated pain, numbness or weakness.  The head and face were not affected.  Symptoms have been constant.  She has never had this before.  She has history of right posterior headache, but this resolved over the past couple of months.  She denies neck pain or difficulty ambulating.  CBC, BMP, LFT and TSH were unremarkable.  PAST MEDICAL HISTORY: Past Medical History  Diagnosis Date  . Osteoporosis     by dexa 2009 had se of fosamax  . Abnormal thyroid blood test     rx wiht med briefly resolved no sx     PAST SURGICAL HISTORY: Past Surgical History  Procedure Laterality Date  . Tonsillectomy      age 89    MEDICATIONS: No current outpatient prescriptions on file prior to visit.   No current facility-administered medications on file prior to visit.    ALLERGIES: Allergies  Allergen Reactions  . Alendronate Sodium     REACTION: GI se  . Hydrocodeine [Dihydrocodeine]     FAMILY HISTORY: Family History  Problem Relation Age of Onset  . Hyperlipidemia Mother   . Heart disease Mother   . Hypertension Mother   . Cancer Father     SOCIAL HISTORY: History   Social History  . Marital Status: Married    Spouse Name: N/A  . Number of Children: N/A  . Years of Education: N/A   Occupational History  . Not on file.   Social History Main Topics  . Smoking status: Never Smoker   . Smokeless  tobacco: Not on file  . Alcohol Use: No  . Drug Use: No  . Sexual Activity: Not on file   Other Topics Concern  . 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    REVIEW OF SYSTEMS: Constitutional: No fevers, chills, or sweats, no generalized fatigue, change in appetite Eyes: No visual changes, double vision, eye pain Ear, nose and throat: No hearing loss, ear pain, nasal congestion, sore throat Cardiovascular: No chest pain, palpitations Respiratory:  No shortness of breath at rest or with exertion, wheezes GastrointestinaI: No nausea, vomiting, diarrhea, abdominal pain, fecal incontinence Genitourinary:  No dysuria, urinary retention or frequency Musculoskeletal:  No neck pain, back pain Integumentary: No rash, pruritus, skin lesions Neurological: as above Psychiatric: No depression, insomnia, anxiety Endocrine: No palpitations, fatigue, diaphoresis, mood swings, change in appetite, change in weight, increased thirst Hematologic/Lymphatic:  No anemia, purpura, petechiae. Allergic/Immunologic: no itchy/runny eyes, nasal congestion, recent allergic reactions, rashes  PHYSICAL EXAM: Filed Vitals:   12/04/14 1449  BP:  108/62  Pulse: 72   General: No acute distress Head:  Normocephalic/atraumatic Eyes:  fundi unremarkable, without vessel changes, exudates, hemorrhages or papilledema. Neck: supple, no paraspinal tenderness, full range of motion Back: No paraspinal tenderness Heart: regular rate and rhythm Lungs: Clear to auscultation bilaterally. Vascular: No carotid bruits. Neurological Exam: Mental status: alert and oriented to person, place, and time, recent and remote memory intact, fund of knowledge  intact, attention and concentration intact, speech fluent and not dysarthric, language intact. Cranial nerves: CN I: not tested CN II: pupils equal, round and reactive to light, visual fields intact, fundi unremarkable, without vessel changes, exudates, hemorrhages or papilledema. CN III, IV, VI:  full range of motion, no nystagmus, no ptosis CN V: facial sensation intact CN VII: upper and lower face symmetric CN VIII: hearing intact CN IX, X: gag intact, uvula midline CN XI: sternocleidomastoid and trapezius muscles intact CN XII: tongue midline Bulk & Tone: normal, no fasciculations. Motor:  5/5 throughout Sensation:  Pinprick and vibration intact Deep Tendon Reflexes:  2+ throughout, toes downgoing Finger to nose testing:  No dysmetria Heel to shin:  No dysmetria Gait:  Normal station and stride.  Able to turn and walk in tandem. Romberg negative.  IMPRESSION: Right sided paresthesias.    PLAN: 1.  Given the unilateral symptoms, a central nervous system etiology should be ruled out.  Recommended MRI of brain and cervical spine.  She said she would like to check with her insurance regarding her out-of-pocket expense before pursuing this. 2.  In the meantime, we will check a B12 level. 3.  Further testing and follow-up pending results  Thank you for allowing me to take part in the care of this patient.  Metta Clines, DO  CC:  Shanon Ace, MD

## 2014-12-04 NOTE — Patient Instructions (Signed)
My recommendation would be to check an MRI of the brain and cervical spine and to check a vitamin B12 level 1.  Check with your insurance to see if you would like to pursue the MRI 2.  In the meantime, we will check a B12 level

## 2014-12-06 ENCOUNTER — Telehealth: Payer: Self-pay | Admitting: *Deleted

## 2014-12-06 NOTE — Telephone Encounter (Signed)
Please advise on B12 level

## 2014-12-06 NOTE — Telephone Encounter (Signed)
Patient following up on her B-12

## 2014-12-07 ENCOUNTER — Telehealth: Payer: Self-pay | Admitting: *Deleted

## 2014-12-07 NOTE — Telephone Encounter (Signed)
Left message for patient She does not have B12 deficiency

## 2014-12-07 NOTE — Telephone Encounter (Signed)
Patient is aware that B12 is normal

## 2014-12-07 NOTE — Telephone Encounter (Signed)
Its okay

## 2014-12-07 NOTE — Telephone Encounter (Signed)
-----   Message from Pieter Partridge, DO sent at 12/05/2014  6:51 AM EDT ----- She does not have B12 deficiency ----- Message -----    From: [facility] Interface    Sent: 12/04/2014   9:47 PM      To: Pieter Partridge, DO

## 2015-03-05 ENCOUNTER — Encounter: Payer: Self-pay | Admitting: Family Medicine

## 2015-03-05 ENCOUNTER — Ambulatory Visit (INDEPENDENT_AMBULATORY_CARE_PROVIDER_SITE_OTHER): Payer: Commercial Managed Care - HMO | Admitting: Family Medicine

## 2015-03-05 VITALS — BP 100/70 | HR 79 | Temp 98.2°F | Wt 130.0 lb

## 2015-03-05 DIAGNOSIS — J029 Acute pharyngitis, unspecified: Secondary | ICD-10-CM | POA: Diagnosis not present

## 2015-03-05 LAB — POCT RAPID STREP A (OFFICE): RAPID STREP A SCREEN: NEGATIVE

## 2015-03-05 NOTE — Progress Notes (Signed)
   Subjective:    Patient ID: Jenny [name], female    DOB: 10-26-1950, 64 y.o.   MRN: [id number]  HPI  Acute visit. For 3 days she's had chills body aches headache and sore throat. Denies any cough or nasal congestion. Her thermometer was broken so temperature not taken. She has not had any nausea or vomiting. No nasal congestion. No sick contacts. Generally very healthy. Nonsmoker. She wanted to be cautious to rule out strep  Past Medical History  Diagnosis Date  . Osteoporosis     by dexa 2009 had se of fosamax  . Abnormal thyroid blood test     rx wiht med briefly resolved no sx    Past Surgical History  Procedure Laterality Date  . Tonsillectomy      age 42    reports that she has never smoked. She does not have any smokeless tobacco history on file. She reports that she does not drink alcohol or use illicit drugs. family history includes Cancer in her father; Heart disease in her mother; Hyperlipidemia in her mother; Hypertension in her mother. Allergies  Allergen Reactions  . Alendronate Sodium     REACTION: GI se  . Hydrocodeine [Dihydrocodeine]      Review of Systems  Constitutional: Positive for chills and fatigue.  HENT: Positive for sore throat. Negative for congestion.   Respiratory: Negative for cough.   Musculoskeletal: Positive for myalgias.  Neurological: Positive for headaches.       Objective:   Physical Exam  Constitutional: She appears well-developed and well-nourished.  HENT:  Right Ear: External ear normal.  Left Ear: External ear normal.  Mouth/Throat: Oropharynx is clear and moist.  Neck: Neck supple.  Minimal anterior cervical adenopathy.  Cardiovascular: Normal rate and regular rhythm.   Pulmonary/Chest: Effort normal and breath sounds normal. No respiratory distress. She has no wheezes. She has no rales.  Skin: No rash noted.          Assessment & Plan:  Pharyngitis.   Rapid strep negative.  Suspect viral.  Treat  symptomatically and follow up prn.

## 2015-03-05 NOTE — Progress Notes (Signed)
Pre visit review using our clinic review tool, if applicable. No additional management support is needed unless otherwise documented below in the visit note. 

## 2015-03-05 NOTE — Patient Instructions (Signed)

## 2015-03-07 ENCOUNTER — Telehealth: Payer: Self-pay | Admitting: Internal Medicine

## 2015-03-07 MED ORDER — AMOXICILLIN 875 MG PO TABS: 875.0000 mg | ORAL_TABLET | Freq: Two times a day (BID) | ORAL | Status: DC

## 2015-03-07 NOTE — Telephone Encounter (Signed)
Rx sent to pharmacy patient is aware.

## 2015-03-07 NOTE — Telephone Encounter (Signed)
Pt cb to fu on a med request. Pt does not have a fever, and she has been taking advil only for her headache.  Very very often.

## 2015-03-07 NOTE — Telephone Encounter (Signed)
Pt seen 8/22 by dr Elease Hashimoto.  Pt states she is still feeling really bad.  Still has the sore throat, but the worst is the HA Pt states she has been "eating" advil, and it may ease up, but shortly after she has taken the advil, the HA returns. Pt would like to know if Dr Elease Hashimoto could give her something for her HA.  pls advise

## 2015-03-07 NOTE — Telephone Encounter (Signed)
Any fever?  My suggestion (to make sure she did not have a false negative strep) is to go ahead and cover with Amoxicillin 875 mg po bid for 10 days.  What has she tried for her headache?

## 2015-03-09 NOTE — Telephone Encounter (Signed)
Left message for patient to return call.

## 2015-03-09 NOTE — Telephone Encounter (Signed)
Pt called back today, Her sore throat is gone. But pt continues to have the headaches. Mostly in the morning when she wakes up. (poss sinus??) They have continued to get worse over the years.   Pt mentioned you may do additional testing, perhaps MRI? Pt would like to know what you reccomend? Pt prefers not to get on medicine, but to fond out what is going on?

## 2015-03-09 NOTE — Telephone Encounter (Signed)
Pt informed

## 2015-03-09 NOTE — Telephone Encounter (Signed)
The Amoxicillin would cover for sinusitis.  Did she start it?  If not would do so and if not better after completing that would have her follow up with Dr Regis Bill to reassess her headache.

## 2015-03-12 ENCOUNTER — Telehealth: Payer: Self-pay | Admitting: Internal Medicine

## 2015-03-12 ENCOUNTER — Ambulatory Visit (INDEPENDENT_AMBULATORY_CARE_PROVIDER_SITE_OTHER): Payer: Commercial Managed Care - HMO | Admitting: Family Medicine

## 2015-03-12 ENCOUNTER — Encounter: Payer: Self-pay | Admitting: Family Medicine

## 2015-03-12 VITALS — BP 100/68 | HR 95 | Temp 98.6°F | Wt 127.0 lb

## 2015-03-12 DIAGNOSIS — G44211 Episodic tension-type headache, intractable: Secondary | ICD-10-CM

## 2015-03-12 MED ORDER — NORTRIPTYLINE HCL 10 MG PO CAPS: 10.0000 mg | ORAL_CAPSULE | Freq: Every day | ORAL | Status: DC

## 2015-03-12 NOTE — Telephone Encounter (Signed)
Pt was informed. To see Dr. Regis Bill for headaches last week.

## 2015-03-12 NOTE — Telephone Encounter (Signed)
Pt wants to know if you will accept her as a patient.  Pt informed to see primary for headaches.

## 2015-03-12 NOTE — Telephone Encounter (Signed)
If headaches still persisting deathly think she needs to be reevaluated. We should encourage her to see Dr. Everlene Other her primary

## 2015-03-12 NOTE — Progress Notes (Signed)
Subjective:    Patient ID: Jenny [name], female    DOB: 27-Sep-1950, 64 y.o.   MRN: [id number]  HPI Patient is seen with complaints of headache. She was seen here recently for acute visit with sore throat. Rapid strep testing was negative. She continued have sore throat and we subsequently called in amoxicillin and her sore throat is resolved at this time. She has a headache which has been about 8 days duration and starts at the base of the neck and radiates bilateral up into the parietal area and occasionally frontal area very symmetric. Headache has been fairly severe at times. She's not had any fever whatsoever. No history of tick bite. Sore throat symptoms fully resolved. She denies any nausea or vomiting. No photosensitivity.  She has tried ice packs which have not helped. She also tried some massage. She's been taking Advil pretty much every 4-6 hours with some temporary relief. Poor sleep quality secondary to headache at times.  Denies any visual changes. No focal weakness. She does recall having somewhat similar headaches several years ago and took muscle relaxer eventually her headache resolved  She does not have history of known migraine headaches She has never had any true stiff neck  Past Medical History  Diagnosis Date  . Osteoporosis     by dexa 2009 had se of fosamax  . Abnormal thyroid blood test     rx wiht med briefly resolved no sx    Past Surgical History  Procedure Laterality Date  . Tonsillectomy      age 68    reports that she has never smoked. She does not have any smokeless tobacco history on file. She reports that she does not drink alcohol or use illicit drugs. family history includes Cancer in her father; Heart disease in her mother; Hyperlipidemia in her mother; Hypertension in her mother. Allergies  Allergen Reactions  . Alendronate Sodium     REACTION: GI se  . Hydrocodeine [Dihydrocodeine]       Review of Systems  Constitutional: Negative for  fever, chills, appetite change and unexpected weight change.  Eyes: Negative for visual disturbance.  Respiratory: Negative for cough and shortness of breath.   Cardiovascular: Negative for chest pain.  Gastrointestinal: Negative for nausea and vomiting.  Neurological: Positive for headaches. Negative for dizziness, seizures and syncope.  Psychiatric/Behavioral: Positive for sleep disturbance. Negative for confusion.       Objective:   Physical Exam  Constitutional: She is oriented to person, place, and time. She appears well-developed and well-nourished. No distress.  HENT:  Mouth/Throat: Oropharynx is clear and moist.  Eyes: Pupils are equal, round, and reactive to light.  Fundi benign  Neck: Neck supple.  Full range of motion cervical spine. She has some cervical muscle tenderness bilaterally  Cardiovascular: Normal rate and regular rhythm.   Pulmonary/Chest: Effort normal and breath sounds normal. No respiratory distress. She has no wheezes. She has no rales.  Lymphadenopathy:    She has no cervical adenopathy.  Neurological: She is alert and oriented to person, place, and time. No cranial nerve deficit. Coordination normal.  Skin: No rash noted.          Assessment & Plan:  Persistent headache following probable viral syndrome. She does not have associated fever and nonfocal neuro exam. Her current headache is very symmetric and sounds more compatible with tension-type headache. We discussed avoidance of regular use of analgesia to avoid analgesia withdrawal headache. She'll try some heat and continue muscle  massage. Trial of low-dose nortriptyline 10 mg daily at bedtime. Touch base in 3-4 days if not improving. She will taper off nortriptyline gradually if headaches resolving over the next couple weeks

## 2015-03-12 NOTE — Telephone Encounter (Signed)
Husband called this am to ask Dr Elease Hashimoto if there was anything else that can be done for pt's headsches?  Pt is aware instructions were to fu w/ Dr Regis Bill, but pt prefers to see dr Elease Hashimoto for this matter. Ask not to see dr Regis Bill.  Husband states pt woke up this am and has severe HA, and the headaches seem to be all the time now.   pls advise.

## 2015-03-12 NOTE — Patient Instructions (Signed)
General Headache Without Cause A headache is pain or discomfort felt around the head or neck area. The specific cause of a headache may not be found. There are many causes and types of headaches. A few common ones are:  Tension headaches.  Migraine headaches.  Cluster headaches.  Chronic daily headaches. HOME CARE INSTRUCTIONS   Keep all follow-up appointments with your caregiver or any specialist referral.  Only take over-the-counter or prescription medicines for pain or discomfort as directed by your caregiver.  Lie down in a dark, quiet room when you have a headache.  Keep a headache journal to find out what may trigger your migraine headaches. For example, write down:  What you eat and drink.  How much sleep you get.  Any change to your diet or medicines.  Try massage or other relaxation techniques.  Put ice packs or heat on the head and neck. Use these 3 to 4 times per day for 15 to 20 minutes each time, or as needed.  Limit stress.  Sit up straight, and do not tense your muscles.  Quit smoking if you smoke.  Limit alcohol use.  Decrease the amount of caffeine you drink, or stop drinking caffeine.  Eat and sleep on a regular schedule.  Get 7 to 9 hours of sleep, or as recommended by your caregiver.  Keep lights dim if bright lights bother you and make your headaches worse. SEEK MEDICAL CARE IF:   You have problems with the medicines you were prescribed.  Your medicines are not working.  You have a change from the usual headache.  You have nausea or vomiting. SEEK IMMEDIATE MEDICAL CARE IF:   Your headache becomes severe.  You have a fever.  You have a stiff neck.  You have loss of vision.  You have muscular weakness or loss of muscle control.  You start losing your balance or have trouble walking.  You feel faint or pass out.  You have severe symptoms that are different from your first symptoms. MAKE SURE YOU:   Understand these  instructions.  Will watch your condition.  Will get help right away if you are not doing well or get worse. Document Released: 06/30/2005 Document Revised: 09/22/2011 Document Reviewed: 07/16/2011 [facility] Patient Information 2015 [place], [place]. This information is not intended to replace advice given to you by your health care provider. Make sure you discuss any questions you have with your health care provider.  If headache resolves over the next few days consider discontinuing the Nortriptyline in a couple of weeks Touch base if headache no better in 3-4 days.

## 2015-03-13 MED ORDER — METHOCARBAMOL 500 MG PO TABS: 500.0000 mg | ORAL_TABLET | Freq: Every day | ORAL | Status: DC

## 2015-03-13 NOTE — Telephone Encounter (Signed)
Pt is calling back concerning headaches and requesting montrice to return her call

## 2015-03-13 NOTE — Telephone Encounter (Signed)
Pt states that she was in pain all night from her jaw pain she did not get any relief from the medcation that she was given yesterday. Pt thinks she may have TMJ and she wants to know if you can prescribe her a muscle relaxer.

## 2015-03-13 NOTE — Telephone Encounter (Signed)
Left message for patient to return call.

## 2015-03-13 NOTE — Telephone Encounter (Signed)
Pt is aware. Rx is sent to pharmacy

## 2015-03-13 NOTE — Telephone Encounter (Signed)
First, I would not give up on using the nortriptyline at this point in terms of abolishing her headache. Sometimes this takes a few days. We can add Robaxin 500 mg by mouth daily at bedtime #30 with no refill

## 2015-05-03 ENCOUNTER — Other Ambulatory Visit: Payer: Self-pay

## 2015-05-03 DIAGNOSIS — Z1231 Encounter for screening mammogram for malignant neoplasm of breast: Secondary | ICD-10-CM

## 2015-05-16 ENCOUNTER — Ambulatory Visit
Admission: RE | Admit: 2015-05-16 | Discharge: 2015-05-16 | Disposition: A | Discharge status: Home / Self Care | Payer: Commercial Managed Care - HMO | Source: Ambulatory Visit

## 2015-05-16 DIAGNOSIS — Z1231 Encounter for screening mammogram for malignant neoplasm of breast: Secondary | ICD-10-CM | POA: Diagnosis not present

## 2015-09-21 DIAGNOSIS — H5210 Myopia, unspecified eye: Secondary | ICD-10-CM | POA: Diagnosis not present

## 2015-09-21 DIAGNOSIS — H524 Presbyopia: Secondary | ICD-10-CM | POA: Diagnosis not present

## 2015-09-24 ENCOUNTER — Telehealth: Payer: Self-pay | Admitting: Internal Medicine

## 2015-09-24 ENCOUNTER — Other Ambulatory Visit: Payer: Self-pay | Admitting: Family Medicine

## 2015-09-24 DIAGNOSIS — Z1211 Encounter for screening for malignant neoplasm of colon: Secondary | ICD-10-CM

## 2015-09-24 NOTE — Telephone Encounter (Signed)
Order placed in the system. 

## 2015-09-24 NOTE — Telephone Encounter (Addendum)
Pt states dr Regis Bill needs to put in the referral for her colonoscopy. Due to insurance. Per Neoma Laming, referral has been done, and is good until August, but dr Regis Bill needs to put in the order.

## 2015-10-01 ENCOUNTER — Encounter: Payer: Self-pay | Admitting: Gastroenterology

## 2015-10-01 ENCOUNTER — Other Ambulatory Visit: Payer: Self-pay | Admitting: Family Medicine

## 2015-10-01 DIAGNOSIS — Z1211 Encounter for screening for malignant neoplasm of colon: Secondary | ICD-10-CM

## 2015-10-01 NOTE — Telephone Encounter (Signed)
Pt notified referral placed in the system.

## 2015-10-01 NOTE — Telephone Encounter (Signed)
Pt following up on colonoscopy referral that is needed by Dr Regis Bill.  Is the order in the system?  I cannot see it and no one has called her.

## 2015-11-13 ENCOUNTER — Ambulatory Visit (AMBULATORY_SURGERY_CENTER): Payer: Self-pay

## 2015-11-13 VITALS — Ht 65.0 in | Wt 136.4 lb

## 2015-11-13 DIAGNOSIS — Z1211 Encounter for screening for malignant neoplasm of colon: Secondary | ICD-10-CM

## 2015-11-13 NOTE — Progress Notes (Signed)
Pt came into the office today for her pre-visit prior to her colonoscopy on 11/27/15.After reviewing the pt's medical history, the pt wanted to discuss  reflux medication with her PCP and if she needs an endoscopy with the colonoscopy.The pt decided to schedule an office visit with Dr Regis Bill and will call back later to reschedule her colonoscopy The colon on 11/27/15 was cancelled.

## 2015-11-21 NOTE — Progress Notes (Signed)
Chief Complaint  Patient presents with  . Gastroesophageal Reflux    pt states that she has had acid reflux, hurting chest and upper mid back started about 2 years ago     HPI: Jenny [name] 65 y.o.  Comes  in fo her ongoing chest pain described as HErt burn reflux   That is now persistent and radiating to her mid back .  No vomiting weight loss   When went to screening colon appt  Was told that could be heart issues w fam hx so send back to pcp.  Didn't do colonoscopy  For reflux sx. Concerning  Review of record reported concern about need for upper endo.    Says " From waist up  Has  Bad at nights "   Night hb acid reflux  That hurst around back  And jhar has had this off and on for a while  And in the past Lung  Person said not her lungs.  . Was put on med bid 2 different ones and resolved for 1-2 months and then comes back .  Avoiding  gerd meds cause of hearing they can cause dementia  So sparingly takes pepcid of prilosec  No dysphagia   Takes asa once a day because sshe thought she should.   Ask about her recurrent headaches for years also  Saw Dr Tomi Likens last year for tingling  Poss neck related   not on the pamelor  Remotely muscle relaxant helped headache years ago but made her to gorggy so take med sparing ling for headaches   Various triggers not tracking    ROS: See pertinent positives and negatives per HPI. No sob exercise related sx   Cough now  Past Medical History  Diagnosis Date  . Osteoporosis     by dexa 2009 had se of fosamax  . Abnormal thyroid blood test     rx wiht med briefly resolved no sx /no meds taken per pt    Family History  Problem Relation Age of Onset  . Hyperlipidemia Mother   . Heart disease Mother   . Hypertension Mother   . Cancer Father     Social History   Social History  . Marital Status: Married    Spouse Name: N/A  . Number of Children: N/A  . Years of Education: N/A   Social History Main Topics  . Smoking status:  Never Smoker   . Smokeless tobacco: Never Used  . Alcohol Use: No  . Drug Use: No  . Sexual Activity: Not Asked   Other Topics Concern  . 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       EXAM:  BP 100/78 mmHg  Pulse 82  Temp(Src) 98.4 F (36.9 C) (Oral)  Ht 5\' 5"  (1.651 m)  Wt 134 lb (60.782 kg)  BMI 22.30 kg/m2  SpO2 97%  Body mass index is 22.3 kg/(m^2).  GENERAL: vitals reviewed and listed above, alert, oriented, appears well hydrated and in no acute distress HEENT: atraumatic, conjunctiva  clear, no obvious abnormalities on inspection of external nose and ears  NECK: no obvious masses on inspection  palpation  LUNGS: clear to auscultation bilaterally, no wheezes, rales or rhonchi, good air movement Z/ pain at mid lower thoraci area but no point tendernses  abd soft no masses   CV: HRRR,s1 s2 no g or m  no clubbing cyanosis or  peripheral edema nl cap refill  MS: moves all extremities without noticeable focal  abnormality PSYCH: pleasant and cooperative, no obvious depression or anxiety looks well  EKG NSR ASSESSMENT AND PLAN:  Discussed the following assessment and plan:  Gastroesophageal reflux disease, esophagitis presence not specified - Plan: Ambulatory referral to Gastroenterology  Chest pain, unspecified chest pain type - fam hx cv disaseas but this doesn not seem card related at this time but does warrant furthre evaluation.  - Plan: EKG 12-Lead, Ambulatory referral to Gastroenterology  Recurrent headache reviewed above  Problems and to focus on the prob   gI pain  Poss need for endo concerns  At this  tim begin med and fu  May need ppi for a 2-3 months and then off   But dont discount the  med if onging issues . Will still need colon at some point  Uncertain if can be done with dx egd  Headaches  Disc seeing ha clinic  As remote hx of same  Track has and then contact us if wants a HA referral  In town or poss dr Joretta Bachelor -Patient advised to return or notify health care team  if symptoms worsen ,persist or new concerns arise. Total visit 30 mins > 50% spent counseling and coordinating care as indicated in above note and in instructions to patient .     Patient Instructions  Stop the asa    For now as this may aggravate Gi inflammation. Take  the famotidine  20 mg  Twice a day  Until you see Gi or at least 3-4 weeks  May need 10 - 12 weeks.    Will do referral to GI as planned  Concern about esophagitis.   I do not think theses sx are from you heart today.  The headaches  : Track the headaches  .   For now .  Over 3 months   Migraine buddy .    Esophagitis Esophagitis is inflammation of the esophagus. The esophagus is the tube that carries food and liquids from your mouth to your stomach. Esophagitis can cause soreness or pain in the esophagus. This condition can make it difficult and painful to swallow.  CAUSES Most causes of esophagitis are not serious. Common causes of this condition include:  Gastroesophageal reflux disease (GERD). This is when stomach contents move back up into the esophagus (reflux).  Repeated vomiting.  An allergic-type reaction, especially caused by food allergies (eosinophilic esophagitis).  Injury to the esophagus by swallowing large pills with or without water, or swallowing certain types of medicines.  Swallowing (ingesting) harmful chemicals, such as household cleaning products.  Heavy alcohol use.  An infection of the esophagus.This most often occurs in people who have a weakened immune system.  Radiation or chemotherapy treatment for cancer.  Certain diseases such as sarcoidosis, Crohn disease, and scleroderma. SYMPTOMS Symptoms of  this condition include:  Difficult or painful swallowing.  Pain with swallowing acidic liquids, such as citrus juices.  Pain with burping.  Chest pain.  Difficulty breathing.  Nausea.  Vomiting.  Pain in the abdomen.  Weight loss.  Ulcers in the mouth.  Patches of white material in the mouth (candidiasis).  Fever.  Coughing up blood  or vomiting blood.  Stool that is black, tarry, or bright red. DIAGNOSIS Your health care provider will take a medical history and perform a physical exam. You may also have other tests, including:  An endoscopy to examine your stomach and esophagus with a small camera.  A test that measures the acidity level in your esophagus.  A test that measures how much pressure is on your esophagus.  A barium swallow or modified barium swallow to show the shape, size, and functioning of your esophagus.  Allergy tests. TREATMENT Treatment for this condition depends on the cause of your esophagitis. In some cases, steroids or other medicines may be given to help relieve your symptoms or to treat the underlying cause of your condition. You may have to make some lifestyle changes, such as:  Avoiding alcohol.  Quitting smoking.  Changing your diet.  Exercising.  Changing your sleep habits and your sleep environment. HOME CARE INSTRUCTIONS Take these actions to decrease your discomfort and to help avoid complications. Diet  Follow a diet as recommended by your health care provider. This may involve avoiding foods and drinks such as:  Coffee and tea (with or without caffeine).  Drinks that contain alcohol.  Energy drinks and sports drinks.  Carbonated drinks or sodas.  Chocolate and cocoa.  Peppermint and mint flavorings.  Garlic and onions.  Horseradish.  Spicy and acidic foods, including peppers, chili powder, curry powder, vinegar, hot sauces, and barbecue sauce.  Citrus fruit juices and citrus fruits, such as oranges, lemons,  and limes.  Tomato-based foods, such as red sauce, chili, salsa, and pizza with red sauce.  Fried and fatty foods, such as donuts, french fries, potato chips, and high-fat dressings.  High-fat meats, such as hot dogs and fatty cuts of red and white meats, such as rib eye steak, sausage, ham, and bacon.  High-fat dairy items, such as whole milk, butter, and cream cheese.  Eat small, frequent meals instead of large meals.  Avoid drinking large amounts of liquid with your meals.  Avoid eating meals during the 2-3 hours before bedtime.  Avoid lying down right after you eat.  Do not exercise right after you eat.  Avoid foods and drinks that seem to make your symptoms worse. General Instructions  Pay attention to any changes in your symptoms.  Take over-the-counter and prescription medicines only as told by your health care provider. Do not take aspirin, ibuprofen, or other NSAIDs unless your health care provider told you to do so.  If you have trouble taking pills, use a pill splitter to decrease the size of the pill. This will decrease the chance of the pill getting stuck or injuring your esophagus on the way down. Also, drink water after you take a pill.  Do not use any tobacco products, including cigarettes, chewing tobacco, and e-cigarettes. If you need help quitting, ask your health care provider.  Wear loose-fitting clothing. Do not wear anything tight around your waist that causes pressure on your abdomen.  Raise (elevate) the head of your bed about 6 inches (15 cm).  Try to reduce your stress, such as with yoga or meditation. If you need help reducing stress, ask your health care provider.  If you are overweight, reduce your weight to an amount that is healthy for you. Ask your health care provider for guidance about a safe weight loss goal.  Keep all follow-up visits as told by your health care provider. This is important. SEEK MEDICAL CARE IF:  You  have new  symptoms.  You have unexplained weight loss.  You have difficulty swallowing, or it hurts to swallow.  You have wheezing or a persistent cough.  Your symptoms do not improve with treatment.  You have frequent heartburn for more than two weeks. SEEK IMMEDIATE MEDICAL CARE IF:  You have severe pain in your arms, neck, jaw, teeth, or back.  You feel sweaty, dizzy, or light-headed.  You have chest pain or shortness of breath.  You vomit and your vomit looks like blood or coffee grounds.  Your stool is bloody or black.  You have a fever.  You cannot swallow, drink, or eat.   This information is not intended to replace advice given to you by your health care provider. Make sure you discuss any questions you have with your health care provider.   Document Released: 08/07/2004 Document Revised: 03/21/2015 Document Reviewed: 10/25/2014 Elsevier Interactive Patient Education 2016 [place] [employer]. Trayden Brandy M.D.

## 2015-11-22 ENCOUNTER — Ambulatory Visit (INDEPENDENT_AMBULATORY_CARE_PROVIDER_SITE_OTHER): Payer: 59 | Admitting: Internal Medicine

## 2015-11-22 ENCOUNTER — Encounter: Payer: Self-pay | Admitting: Gastroenterology

## 2015-11-22 ENCOUNTER — Encounter: Payer: Self-pay | Admitting: Internal Medicine

## 2015-11-22 VITALS — BP 100/78 | HR 82 | Temp 98.4°F | Ht 65.0 in | Wt 134.0 lb

## 2015-11-22 DIAGNOSIS — K219 Gastro-esophageal reflux disease without esophagitis: Secondary | ICD-10-CM | POA: Diagnosis not present

## 2015-11-22 DIAGNOSIS — R079 Chest pain, unspecified: Secondary | ICD-10-CM

## 2015-11-22 DIAGNOSIS — R519 Headache, unspecified: Secondary | ICD-10-CM

## 2015-11-22 DIAGNOSIS — R51 Headache: Secondary | ICD-10-CM

## 2015-11-22 NOTE — Assessment & Plan Note (Signed)
Hx of sx never had endo  Avoiding med concern about long term se  See text use h2 blocker for now  Risk benefit disc  Stop asa for now  And get Gi referral

## 2015-11-22 NOTE — Progress Notes (Signed)
Pre visit review using our clinic review tool, if applicable. No additional management support is needed unless otherwise documented below in the visit note. 

## 2015-11-22 NOTE — Patient Instructions (Signed)
Stop the asa    For now as this may aggravate Gi inflammation. Take  the famotidine  20 mg  Twice a day  Until you see Gi or at least 3-4 weeks  May need 10 - 12 weeks.    Will do referral to GI as planned  Concern about esophagitis.   I do not think theses sx are from you heart today.  The headaches  : Track the headaches  .   For now .  Over 3 months   Migraine buddy .    Esophagitis Esophagitis is inflammation of the esophagus. The esophagus is the tube that carries food and liquids from your mouth to your stomach. Esophagitis can cause soreness or pain in the esophagus. This condition can make it difficult and painful to swallow.  CAUSES Most causes of esophagitis are not serious. Common causes of this condition include:  Gastroesophageal reflux disease (GERD). This is when stomach contents move back up into the esophagus (reflux).  Repeated vomiting.  An allergic-type reaction, especially caused by food allergies (eosinophilic esophagitis).  Injury to the esophagus by swallowing large pills with or without water, or swallowing certain types of medicines.  Swallowing (ingesting) harmful chemicals, such as household cleaning products.  Heavy alcohol use.  An infection of the esophagus.This most often occurs in people who have a weakened immune system.  Radiation or chemotherapy treatment for cancer.  Certain diseases such as sarcoidosis, Crohn disease, and scleroderma. SYMPTOMS Symptoms of this condition include:  Difficult or painful swallowing.  Pain with swallowing acidic liquids, such as citrus juices.  Pain with burping.  Chest pain.  Difficulty breathing.  Nausea.  Vomiting.  Pain in the abdomen.  Weight loss.  Ulcers in the mouth.  Patches of white material in the mouth (candidiasis).  Fever.  Coughing up blood or vomiting blood.  Stool that is black, tarry, or bright red. DIAGNOSIS Your health care provider will take a medical history and  perform a physical exam. You may also have other tests, including:  An endoscopy to examine your stomach and esophagus with a small camera.  A test that measures the acidity level in your esophagus.  A test that measures how much pressure is on your esophagus.  A barium swallow or modified barium swallow to show the shape, size, and functioning of your esophagus.  Allergy tests. TREATMENT Treatment for this condition depends on the cause of your esophagitis. In some cases, steroids or other medicines may be given to help relieve your symptoms or to treat the underlying cause of your condition. You may have to make some lifestyle changes, such as:  Avoiding alcohol.  Quitting smoking.  Changing your diet.  Exercising.  Changing your sleep habits and your sleep environment. HOME CARE INSTRUCTIONS Take these actions to decrease your discomfort and to help avoid complications. Diet  Follow a diet as recommended by your health care provider. This may involve avoiding foods and drinks such as:  Coffee and tea (with or without caffeine).  Drinks that contain alcohol.  Energy drinks and sports drinks.  Carbonated drinks or sodas.  Chocolate and cocoa.  Peppermint and mint flavorings.  Garlic and onions.  Horseradish.  Spicy and acidic foods, including peppers, chili powder, curry powder, vinegar, hot sauces, and barbecue sauce.  Citrus fruit juices and citrus fruits, such as oranges, lemons, and limes.  Tomato-based foods, such as red sauce, chili, salsa, and pizza with red sauce.  Fried and fatty foods, such as donuts,  french fries, potato chips, and high-fat dressings.  High-fat meats, such as hot dogs and fatty cuts of red and white meats, such as rib eye steak, sausage, ham, and bacon.  High-fat dairy items, such as whole milk, butter, and cream cheese.  Eat small, frequent meals instead of large meals.  Avoid drinking large amounts of liquid with your  meals.  Avoid eating meals during the 2-3 hours before bedtime.  Avoid lying down right after you eat.  Do not exercise right after you eat.  Avoid foods and drinks that seem to make your symptoms worse. General Instructions  Pay attention to any changes in your symptoms.  Take over-the-counter and prescription medicines only as told by your health care provider. Do not take aspirin, ibuprofen, or other NSAIDs unless your health care provider told you to do so.  If you have trouble taking pills, use a pill splitter to decrease the size of the pill. This will decrease the chance of the pill getting stuck or injuring your esophagus on the way down. Also, drink water after you take a pill.  Do not use any tobacco products, including cigarettes, chewing tobacco, and e-cigarettes. If you need help quitting, ask your health care provider.  Wear loose-fitting clothing. Do not wear anything tight around your waist that causes pressure on your abdomen.  Raise (elevate) the head of your bed about 6 inches (15 cm).  Try to reduce your stress, such as with yoga or meditation. If you need help reducing stress, ask your health care provider.  If you are overweight, reduce your weight to an amount that is healthy for you. Ask your health care provider for guidance about a safe weight loss goal.  Keep all follow-up visits as told by your health care provider. This is important. SEEK MEDICAL CARE IF:  You have new symptoms.  You have unexplained weight loss.  You have difficulty swallowing, or it hurts to swallow.  You have wheezing or a persistent cough.  Your symptoms do not improve with treatment.  You have frequent heartburn for more than two weeks. SEEK IMMEDIATE MEDICAL CARE IF:  You have severe pain in your arms, neck, jaw, teeth, or back.  You feel sweaty, dizzy, or light-headed.  You have chest pain or shortness of breath.  You vomit and your vomit looks like blood or coffee  grounds.  Your stool is bloody or black.  You have a fever.  You cannot swallow, drink, or eat.   This information is not intended to replace advice given to you by your health care provider. Make sure you discuss any questions you have with your health care provider.   Document Released: 08/07/2004 Document Revised: 03/21/2015 Document Reviewed: 10/25/2014 Elsevier Interactive Patient Education [employer].

## 2015-11-27 ENCOUNTER — Encounter: Payer: Commercial Managed Care - HMO | Admitting: Gastroenterology

## 2016-01-10 DIAGNOSIS — S299XXA Unspecified injury of thorax, initial encounter: Secondary | ICD-10-CM | POA: Diagnosis not present

## 2016-01-10 DIAGNOSIS — S3993XA Unspecified injury of pelvis, initial encounter: Secondary | ICD-10-CM | POA: Diagnosis not present

## 2016-01-10 DIAGNOSIS — Z041 Encounter for examination and observation following transport accident: Secondary | ICD-10-CM | POA: Diagnosis not present

## 2016-01-21 ENCOUNTER — Ambulatory Visit (INDEPENDENT_AMBULATORY_CARE_PROVIDER_SITE_OTHER): Payer: Commercial Managed Care - HMO | Admitting: Internal Medicine

## 2016-01-21 ENCOUNTER — Encounter: Payer: Self-pay | Admitting: Internal Medicine

## 2016-01-21 ENCOUNTER — Telehealth: Payer: Self-pay | Admitting: Internal Medicine

## 2016-01-21 VITALS — BP 118/76 | Temp 98.3°F | Ht 65.0 in | Wt 133.8 lb

## 2016-01-21 DIAGNOSIS — R0789 Other chest pain: Secondary | ICD-10-CM

## 2016-01-21 DIAGNOSIS — R918 Other nonspecific abnormal finding of lung field: Secondary | ICD-10-CM | POA: Diagnosis not present

## 2016-01-21 DIAGNOSIS — R0781 Pleurodynia: Secondary | ICD-10-CM | POA: Diagnosis not present

## 2016-01-21 DIAGNOSIS — S299XXA Unspecified injury of thorax, initial encounter: Secondary | ICD-10-CM

## 2016-01-21 DIAGNOSIS — T23509A Corrosion of first degree of unspecified hand, unspecified site, initial encounter: Secondary | ICD-10-CM

## 2016-01-21 DIAGNOSIS — S298XXA Other specified injuries of thorax, initial encounter: Secondary | ICD-10-CM | POA: Diagnosis not present

## 2016-01-21 DIAGNOSIS — R079 Chest pain, unspecified: Secondary | ICD-10-CM

## 2016-01-21 NOTE — Patient Instructions (Signed)
You  Have chest wall pain and  Sternal  Area  And  Chondral joints  and  At right  RUQ and rib cage area .   I suggest  Activity as tolerated  Limited  Then  Plan   In 3 weeks .  If gets worse   Recheck earlier .  At some point we will recheck the  Chest x ray .

## 2016-01-21 NOTE — Telephone Encounter (Signed)
Noted  
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
## 2016-01-23 ENCOUNTER — Encounter: Payer: Self-pay | Admitting: Gastroenterology

## 2016-01-23 ENCOUNTER — Ambulatory Visit (INDEPENDENT_AMBULATORY_CARE_PROVIDER_SITE_OTHER): Payer: Commercial Managed Care - HMO | Admitting: Gastroenterology

## 2016-01-23 VITALS — BP 100/60 | HR 66 | Ht 64.5 in | Wt 134.0 lb

## 2016-01-23 DIAGNOSIS — Z1211 Encounter for screening for malignant neoplasm of colon: Secondary | ICD-10-CM | POA: Diagnosis not present

## 2016-01-23 DIAGNOSIS — Z1212 Encounter for screening for malignant neoplasm of rectum: Secondary | ICD-10-CM

## 2016-01-23 DIAGNOSIS — R6889 Other general symptoms and signs: Secondary | ICD-10-CM

## 2016-01-23 DIAGNOSIS — R0989 Other specified symptoms and signs involving the circulatory and respiratory systems: Secondary | ICD-10-CM

## 2016-01-23 DIAGNOSIS — K219 Gastro-esophageal reflux disease without esophagitis: Secondary | ICD-10-CM

## 2016-01-23 DIAGNOSIS — R198 Other specified symptoms and signs involving the digestive system and abdomen: Secondary | ICD-10-CM

## 2016-01-23 NOTE — Progress Notes (Signed)
History of Present Illness: This is a 65 year old female with heartburn, nausea and throat clearing. She is accompanied by her husband. She relates a several year history of intermittent episodes of heartburn occurring about once per month. She has had several episodes of severe nausea. Her most recent episode she took Tums and her symptoms improved after about one half hour. The past several months she has had problems with ongoing throat clearing on a daily basis. She has taken Pepcid daily as needed, infrequently, which has worked for intermittent heartburn symptoms. She was concerned about taking a PPI due to reports she's read about PPIs associated with dementia. She previously underwent colonoscopy in April 2005 showing small internal hemorrhoids and it was otherwise normal. Denies weight loss, abdominal pain, constipation, diarrhea, change in stool caliber, melena, hematochezia, vomiting, dysphagia, chest pain.   Allergies  Allergen Reactions  . Alendronate Sodium     REACTION: GI se/upset stomach  . Hydrocodeine [Dihydrocodeine] Other (See Comments)    Rapid heartbeat   Outpatient Prescriptions Prior to Visit  Medication Sig Dispense Refill  . aspirin 81 MG tablet Take 81 mg by mouth daily. Hold for now    . famotidine (PEPCID) 20 MG tablet Take 20 mg by mouth 2 (two) times daily as needed for heartburn or indigestion. As directed see  Text 5 17    . OMEPRAZOLE PO Take by mouth.     No facility-administered medications prior to visit.   Past Medical History  Diagnosis Date  . Osteoporosis     by dexa 2009 had se of fosamax  . Abnormal thyroid blood test     rx wiht med briefly resolved no sx /no meds taken per pt  . GERD (gastroesophageal reflux disease)    Past Surgical History  Procedure Laterality Date  . Tonsillectomy      age 76  . Child birth      x 2  . Colonoscopy     Social History   Social History  . Marital Status: Married    Spouse Name: [name]  .  Number of Children: 2  . Years of Education: N/A   Occupational History  . retired    Social History Main Topics  . Smoking status: Never Smoker   . Smokeless tobacco: Never Used  . Alcohol Use: No  . Drug Use: No  . Sexual Activity: Not Asked   Other Topics Concern  . 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   Family History  Problem Relation Age of Onset  . Hyperlipidemia Mother   . Heart disease Mother   . Hypertension Mother   . Cancer Father     unsure of type      Physical Exam: General: Well developed, well nourished, no acute distress Head: Normocephalic and atraumatic Eyes:  sclerae anicteric, EOMI Ears: Normal auditory acuity Mouth: No deformity or lesions Lungs: Clear throughout to auscultation Heart: Regular rate and rhythm; no murmurs, rubs or bruits Abdomen: Soft, non tender and non distended. No masses, hepatosplenomegaly or hernias noted. Normal Bowel sounds Rectal: deferred Musculoskeletal: Symmetrical with no gross deformities  Pulses:  Normal pulses  noted Extremities: No clubbing, cyanosis, edema or deformities noted Neurological: Alert oriented x 4, grossly nonfocal Psychological:  Alert and cooperative. Normal mood and affect  Assessment and Recommendations:  1. 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 Rule out ENT etiologies, allergies causing throat clearing. Patient is very reluctant to take a PPI despite being sure that the studies associating with PPIs with dementia were poorly done and not reliable. Follow all standard antireflux measures. Pepcid 20 mg twice daily. Return office visit in 2 months. If symptoms not substantially improved or resolved consider EGD and ENT referral.  2. CRC  screening, average risk. Overdue for 10 year interval screening. Schedule colonoscopy at her return office visit once we determine if EGD is needed at the same time.

## 2016-01-23 NOTE — Patient Instructions (Signed)
Stay on Pepcid 20 mg twice daily until your scheduled follow up visit with Dr. Fuller Plan.   Patient advised to avoid spicy, acidic, citrus, chocolate, mints, fruit and fruit juices.  Limit the intake of caffeine, alcohol and Soda.  Don't exercise too soon after eating.  Don't lie down within 3-4 hours of eating.  Elevate the head of your bed.   Your follow up visit is scheduled for 03/25/16 at 9:00am.   Thank you for choosing me and [facility].  Pricilla Riffle. Dagoberto Ligas., MD., Marval Regal

## 2016-02-04 ENCOUNTER — Ambulatory Visit (INDEPENDENT_AMBULATORY_CARE_PROVIDER_SITE_OTHER)
Admission: RE | Admit: 2016-02-04 | Discharge: 2016-02-04 | Disposition: A | Discharge status: Home / Self Care | Payer: Commercial Managed Care - HMO | Source: Ambulatory Visit | Attending: Internal Medicine | Admitting: Internal Medicine

## 2016-02-04 DIAGNOSIS — S299XXA Unspecified injury of thorax, initial encounter: Secondary | ICD-10-CM

## 2016-02-04 DIAGNOSIS — R918 Other nonspecific abnormal finding of lung field: Secondary | ICD-10-CM

## 2016-02-04 DIAGNOSIS — R079 Chest pain, unspecified: Secondary | ICD-10-CM | POA: Diagnosis not present

## 2016-02-04 DIAGNOSIS — S298XXA Other specified injuries of thorax, initial encounter: Secondary | ICD-10-CM

## 2016-02-11 ENCOUNTER — Ambulatory Visit (INDEPENDENT_AMBULATORY_CARE_PROVIDER_SITE_OTHER): Payer: Commercial Managed Care - HMO | Admitting: Internal Medicine

## 2016-02-11 ENCOUNTER — Encounter: Payer: Self-pay | Admitting: Internal Medicine

## 2016-02-11 VITALS — BP 122/76 | Temp 98.6°F | Ht 64.5 in | Wt 134.8 lb

## 2016-02-11 DIAGNOSIS — S299XXS Unspecified injury of thorax, sequela: Secondary | ICD-10-CM

## 2016-02-11 DIAGNOSIS — S298XXA Other specified injuries of thorax, initial encounter: Secondary | ICD-10-CM

## 2016-02-11 DIAGNOSIS — R079 Chest pain, unspecified: Secondary | ICD-10-CM

## 2016-02-11 DIAGNOSIS — R0789 Other chest pain: Secondary | ICD-10-CM

## 2016-02-11 DIAGNOSIS — R0781 Pleurodynia: Secondary | ICD-10-CM

## 2016-02-11 DIAGNOSIS — S298XXS Other specified injuries of thorax, sequela: Secondary | ICD-10-CM | POA: Diagnosis not present

## 2016-02-11 NOTE — Patient Instructions (Signed)
Activity as tolerated   Expect to be better in another month   If not or worse then ROV. Contact us after all pain  Is resolved  As planned
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
## 2016-03-25 ENCOUNTER — Ambulatory Visit: Payer: Commercial Managed Care - HMO | Admitting: Gastroenterology

## 2016-04-03 ENCOUNTER — Telehealth: Payer: Self-pay | Admitting: Internal Medicine

## 2016-04-03 DIAGNOSIS — R918 Other nonspecific abnormal finding of lung field: Secondary | ICD-10-CM

## 2016-04-03 NOTE — Telephone Encounter (Signed)
Pt had xray in July 17. Pt would like to know if she would benefit from going to see pulmonologist

## 2016-04-03 NOTE — Telephone Encounter (Signed)
Ok to refer to pulmonary for  Abn x ray  R/o copd  (Will need pfts at some point)

## 2016-04-04 NOTE — Telephone Encounter (Signed)
Left a message on identified voicemail informing the pt that referral has been placed.  Call back to speak to [facility] (referral specialist) if any questions.

## 2016-05-01 ENCOUNTER — Other Ambulatory Visit: Payer: Self-pay | Admitting: Internal Medicine

## 2016-05-01 DIAGNOSIS — Z1231 Encounter for screening mammogram for malignant neoplasm of breast: Secondary | ICD-10-CM

## 2016-05-07 ENCOUNTER — Institutional Professional Consult (permissible substitution): Payer: Commercial Managed Care - HMO | Admitting: Pulmonary Disease

## 2016-05-14 ENCOUNTER — Ambulatory Visit (INDEPENDENT_AMBULATORY_CARE_PROVIDER_SITE_OTHER): Payer: Commercial Managed Care - HMO | Admitting: Internal Medicine

## 2016-05-14 ENCOUNTER — Encounter: Payer: Self-pay | Admitting: Internal Medicine

## 2016-05-14 VITALS — BP 112/80 | HR 83 | Ht 65.0 in | Wt 137.0 lb

## 2016-05-14 DIAGNOSIS — M546 Pain in thoracic spine: Secondary | ICD-10-CM

## 2016-05-14 DIAGNOSIS — R05 Cough: Secondary | ICD-10-CM

## 2016-05-14 DIAGNOSIS — R059 Cough, unspecified: Secondary | ICD-10-CM

## 2016-05-14 MED ORDER — FAMOTIDINE 20 MG PO TABS: ORAL_TABLET | ORAL | 2 refills | Status: AC

## 2016-05-14 MED ORDER — PANTOPRAZOLE SODIUM 40 MG PO TBEC: 40.0000 mg | DELAYED_RELEASE_TABLET | Freq: Every day | ORAL | 2 refills | Status: DC
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
## 2016-05-14 NOTE — Assessment & Plan Note (Signed)
Pain dates back to 2014 and continues to be positional, not pleuritic > f/u PC re any referral as does not appear to have anything to do with cough or a pulmonary source

## 2016-05-14 NOTE — Assessment & Plan Note (Signed)
Clearly improved previously on GERD rx  C/w uacs  Of the three most common causes of chronic cough, only one (GERD)  can actually cause the other two (asthma and post nasal drip syndrome)  and perpetuate the cylce of cough inducing airway trauma, inflammation, heightened sensitivity to reflux which is prompted by the cough itself via a cyclical mechanism.    This may partially respond to steroids and look like asthma and post nasal drainage but never erradicated completely unless the cough and the secondary reflux are eliminated, preferably both at the same time.  While not intuitively obvious, many patients with chronic low grade reflux do not cough until there is a secondary insult that disturbs the protective epithelial barrier and exposes sensitive nerve endings.  This can be viral or direct physical injury such as with an endotracheal tube.   The point is that once this occurs, it is difficult to eliminate using anything but a maximally effective acid suppression regimen at least in the short run, accompanied by an appropriate diet to address non acid GERD.   For now needs heavy gerd rx/ diet then methacholine challenge to exclude cough variant asthma from ddx. If responds to gerd rx would consider GI referral as pt may benefit from alternative to ppis  Total time devoted to counseling  = 35/77m review case with pt/ discussion of options/alternatives/ personally creating written instructions  in presence of pt  then going over those specific  Instructions directly with the pt including how to use all of the meds but in particular covering each new medication in detail and the difference between the maintenance/automatic meds and the prns using an action plan format for the latter.

## 2016-05-14 NOTE — Patient Instructions (Addendum)
Pantoprazole (protonix) 40 mg   Take  30-60 min before first meal of the day and Pepcid (famotidine)  20 mg one @  bedtime until you see Dr Fuller Plan or call me to schedule the next test   GERD (REFLUX)  is an extremely common cause of respiratory symptoms just like yours , many times with no obvious heartburn at all.    It can be treated with medication, but also with lifestyle changes including elevation of the head of your bed (ideally with 6 inch  bed blocks),  Smoking cessation, avoidance of late meals, excessive alcohol, and avoid fatty foods, chocolate, peppermint, colas, red wine, and acidic juices such as orange juice.  NO MINT OR MENTHOL PRODUCTS SO NO COUGH DROPS  USE SUGARLESS CANDY INSTEAD (Jolley ranchers or Stover's or Life Savers) or even ice chips will also do - the key is to swallow to prevent all throat clearing. NO OIL BASED VITAMINS - use powdered substitutes.   If cough not improving after a minimum of 2 weeks on this treatment you need a methacholine challenge test to see whether or not your cough is due to asthma

## 2016-05-21 ENCOUNTER — Ambulatory Visit
Admission: RE | Admit: 2016-05-21 | Discharge: 2016-05-21 | Disposition: A | Discharge status: Home / Self Care | Payer: Commercial Managed Care - HMO | Source: Ambulatory Visit | Attending: Internal Medicine | Admitting: Internal Medicine

## 2016-05-21 DIAGNOSIS — Z1231 Encounter for screening mammogram for malignant neoplasm of breast: Secondary | ICD-10-CM

## 2016-07-02 ENCOUNTER — Institutional Professional Consult (permissible substitution): Payer: Commercial Managed Care - HMO | Admitting: Pulmonary Disease

## 2016-08-07 ENCOUNTER — Ambulatory Visit (INDEPENDENT_AMBULATORY_CARE_PROVIDER_SITE_OTHER): Payer: Medicare HMO | Admitting: Gastroenterology

## 2016-08-07 ENCOUNTER — Encounter (INDEPENDENT_AMBULATORY_CARE_PROVIDER_SITE_OTHER): Payer: Self-pay

## 2016-08-07 ENCOUNTER — Encounter: Payer: Self-pay | Admitting: Gastroenterology

## 2016-08-07 VITALS — BP 100/72 | HR 76 | Ht 65.0 in | Wt 138.5 lb

## 2016-08-07 DIAGNOSIS — Z1211 Encounter for screening for malignant neoplasm of colon: Secondary | ICD-10-CM | POA: Diagnosis not present

## 2016-08-07 DIAGNOSIS — K219 Gastro-esophageal reflux disease without esophagitis: Secondary | ICD-10-CM | POA: Diagnosis not present

## 2016-08-07 DIAGNOSIS — Z1212 Encounter for screening for malignant neoplasm of rectum: Secondary | ICD-10-CM | POA: Diagnosis not present

## 2016-08-07 MED ORDER — NA SULFATE-K SULFATE-MG SULF 17.5-3.13-1.6 GM/177ML PO SOLN: 1.0000 | Freq: Once | ORAL | 0 refills | Status: AC

## 2016-08-07 NOTE — Patient Instructions (Signed)
You have been scheduled for an endoscopy and colonoscopy. Please follow the written instructions given to you at your visit today. Please pick up your prep supplies at the pharmacy within the next 1-3 days. If you use inhalers (even only as needed), please bring them with you on the day of your procedure. Your physician has requested that you go to www.startemmi.com and enter the access code given to you at your visit today. This web site gives a general overview about your procedure. However, you should still follow specific instructions given to you by our office regarding your preparation for the procedure.  Thank you for choosing me and [facility].  Malcolm T. Stark, Jr., MD., FACG  

## 2016-08-07 NOTE — Progress Notes (Signed)
    History of Present Illness: This is a 66 year old female returning for follow-up of GERD. She is accompanied by her husband. She relates her throat clearing and heartburn have substantially improved on her current regimen. She also noted intermittent mid back pain in the past and also has improved with her current regimen. She is very reluctant to remain on medications long-term however she understands her symptoms have improved because of these medications.  Current Medications, Allergies, Past Medical History, Past Surgical History, Family History and Social History were reviewed in [employer] record.  Physical Exam: General: Well developed, well nourished, no acute distress Head: Normocephalic and atraumatic Eyes:  sclerae anicteric, EOMI Ears: Normal auditory acuity Mouth: No deformity or lesions Lungs: Clear throughout to auscultation Heart: Regular rate and rhythm; no murmurs, rubs or bruits Abdomen: Soft, non tender and non distended. No masses, hepatosplenomegaly or hernias noted. Normal Bowel sounds Rectal: deferred to colonoscopy  Musculoskeletal: Symmetrical with no gross deformities  Pulses:  Normal pulses noted Extremities: No clubbing, cyanosis, edema or deformities noted Neurological: Alert oriented x 4, grossly nonfocal Psychological:  Alert and cooperative. Normal mood and affect  Assessment and Recommendations:  1. 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 Continue pantoprazole 40 mg by mouth every morning and famotidine 20 mg by mouth every afternoon. Follow all antireflux measures. Rule out Barrett's, esophagitis. We again discussed long-term PPI use. We discussed the option of antireflux surgery which I do not recommend as her symptoms are controlled on her current regimen. Schedule EGD. The risks (including bleeding, perforation, infection, missed lesions, medication reactions and possible hospitalization or surgery if complications occur), benefits, and  alternatives to endoscopy with possible biopsy and possible dilation were discussed with the patient and they consent to proceed.   2. CRC screening, average risk. Schedule colonoscopy. The risks (including bleeding, perforation, infection, missed lesions, medication reactions and possible hospitalization or surgery if complications occur), benefits, and alternatives to colonoscopy with possible biopsy and possible polypectomy were discussed with the patient and they consent to proceed.

## 2016-09-08 ENCOUNTER — Encounter: Payer: Self-pay | Admitting: Gastroenterology

## 2016-09-12 ENCOUNTER — Telehealth: Payer: Self-pay | Admitting: Internal Medicine

## 2016-09-12 DIAGNOSIS — K219 Gastro-esophageal reflux disease without esophagitis: Secondary | ICD-10-CM

## 2016-09-12 NOTE — Telephone Encounter (Signed)
Okay to put in referral. 

## 2016-09-12 NOTE — Telephone Encounter (Signed)
Pt needs a referral for colonoscopy to see dr Harrell Gave vincent phone [phone] fax # [phone]. Pt can not see dr stark due to copay is 300.00.

## 2016-09-12 NOTE — Telephone Encounter (Signed)
°  Error    Pt call to request a referral to the below office   [place]

## 2016-09-12 NOTE — Telephone Encounter (Signed)
Ok to do referral as requested

## 2016-09-15 NOTE — Addendum Note (Signed)
Addended by: Milford Cage on: 09/15/2016 01:21 PM   Modules accepted: Orders

## 2016-09-22 ENCOUNTER — Encounter: Payer: Medicare HMO | Admitting: Gastroenterology

## 2016-10-09 DIAGNOSIS — Z1211 Encounter for screening for malignant neoplasm of colon: Secondary | ICD-10-CM | POA: Diagnosis not present

## 2016-11-03 DIAGNOSIS — K219 Gastro-esophageal reflux disease without esophagitis: Secondary | ICD-10-CM | POA: Diagnosis not present

## 2016-11-03 DIAGNOSIS — Z1211 Encounter for screening for malignant neoplasm of colon: Secondary | ICD-10-CM | POA: Diagnosis not present

## 2017-04-13 DIAGNOSIS — H524 Presbyopia: Secondary | ICD-10-CM | POA: Diagnosis not present

## 2017-04-13 DIAGNOSIS — Z0100 Encounter for examination of eyes and vision without abnormal findings: Secondary | ICD-10-CM | POA: Diagnosis not present

## 2017-04-14 ENCOUNTER — Other Ambulatory Visit: Payer: Self-pay | Admitting: Internal Medicine

## 2017-04-14 DIAGNOSIS — Z1231 Encounter for screening mammogram for malignant neoplasm of breast: Secondary | ICD-10-CM

## 2017-06-16 ENCOUNTER — Ambulatory Visit
Admission: RE | Admit: 2017-06-16 | Discharge: 2017-06-16 | Disposition: A | Discharge status: Home / Self Care | Payer: Medicare HMO | Source: Ambulatory Visit | Attending: Internal Medicine | Admitting: Internal Medicine

## 2017-06-16 DIAGNOSIS — Z1231 Encounter for screening mammogram for malignant neoplasm of breast: Secondary | ICD-10-CM | POA: Diagnosis not present

## 2017-08-20 ENCOUNTER — Telehealth: Payer: Self-pay | Admitting: Family Medicine

## 2017-08-20 NOTE — Telephone Encounter (Signed)
Copied from [place] [id number]. Topic: Inquiry >> Aug 20, 2017  1:13 PM Pricilla Handler wrote: Reason for CRM: Patient wants to transfer care from [facility] to [facility] in [place]. Patient lives very close to the [place] office and would like to start receiving primary care there. Please call this patient to let her know if this transfer would be ok.       Thank You!!!

## 2017-08-21 NOTE — Telephone Encounter (Signed)
Ok with me 

## 2017-08-21 NOTE — Telephone Encounter (Signed)
Routing to Dr. Jonni Sanger to approve the transfer to her as PCP

## 2017-08-21 NOTE — Telephone Encounter (Signed)
Please schedule her with me. I'd be happy to see her.  Thanks.

## 2017-08-21 NOTE — Telephone Encounter (Signed)
Left message for Pt to call back and schedule. Crm in system.

## 2017-09-01 ENCOUNTER — Encounter: Payer: Self-pay | Admitting: Family Medicine

## 2017-09-01 ENCOUNTER — Ambulatory Visit (INDEPENDENT_AMBULATORY_CARE_PROVIDER_SITE_OTHER): Payer: PPO | Admitting: Family Medicine

## 2017-09-01 VITALS — BP 118/80 | HR 76 | Temp 97.7°F | Ht 66.0 in | Wt 141.8 lb

## 2017-09-01 DIAGNOSIS — M810 Age-related osteoporosis without current pathological fracture: Secondary | ICD-10-CM

## 2017-09-01 DIAGNOSIS — Z23 Encounter for immunization: Secondary | ICD-10-CM

## 2017-09-01 DIAGNOSIS — Z820 Family history of epilepsy and other diseases of the nervous system: Secondary | ICD-10-CM | POA: Diagnosis not present

## 2017-09-01 DIAGNOSIS — K219 Gastro-esophageal reflux disease without esophagitis: Secondary | ICD-10-CM | POA: Diagnosis not present

## 2017-09-01 HISTORY — DX: Gastro-esophageal reflux disease without esophagitis: K21.9

## 2017-09-01 NOTE — Patient Instructions (Addendum)
Please return in 1-3 months for your complete physical, come fasting.  Medicare recommends an Annual Wellness Visit for all patients.   Please schedule this to be done with our Nurse Educator, Maudie Mercury. This is an informative "talk" visit; it's goals are to ensure that your health care needs are being met and to give you education regarding avoiding falls, ensuring you are not suffering from depression or problems with memory or thinking, and to educate you on Advance Care Planning. It helps me take good care of you!  We will call you with information regarding your referral appointment. Bone density test at [facility] mammography.  It was a pleasure meeting you today! Thank you for choosing [place] to meet your healthcare needs! I truly look forward to working with you. If you have any questions or concerns, please send me a message via Mychart or call the office at [phone].   Calcium Intake Recommendations You can take Caltrate Plus twice a day or get it through your diet or other OTC supplements (Viactiv, OsCal etc)  Calcium is a mineral that affects many functions in the body, including:  Blood clotting.  Blood vessel function.  Nerve impulse conduction.  Hormone secretion.  Muscle contraction.  Bone and teeth functions.  Most of your body's calcium supply is stored in your bones and teeth. When your calcium stores are low, you may be at risk for low bone mass, bone loss, and bone fractures. Consuming enough calcium helps to grow healthy bones and teeth and to prevent breakdown over time. It is very important that you get enough calcium if you are:  A child undergoing rapid growth.  An adolescent girl.  A pre- or post-menopausal woman.  A woman whose menstrual cycle has stopped due to anorexia nervosa or regular intense exercise.  An individual with lactose intolerance or a milk allergy.  A vegetarian.  What is my plan? Try to consume the recommended amount of calcium daily based  on your age. Depending on your overall health, your health care provider may recommend increased calcium intake.General daily calcium intake recommendations by age are:  Birth to 6 months: 200 mg.  Infants 7 to 12 months: 260 mg.  Children 1 to 3 years: 700 mg.  Children 4 to 8 years: 1,000 mg.  Children 9 to 13 years: 1,300 mg.  Teens 14 to 18 years: 1,300 mg.  Adults 19 to 50 years: 1,000 mg.  Adult women 51 to 70 years: 1,200 mg.  Adult men 51 to 70 years: 1,000 mg.  Adults 71 years and older: 1,200 mg.  Pregnant and breastfeeding teens: 1,300 mg.  Pregnant and breastfeeding adults: 1,000 mg.  What do I need to know about calcium intake?  In order for the body to absorb calcium, it needs vitamin D. You can get vitamin D through (we recommend getting [phone] units of Vitamin D daily) ? Direct exposure of the skin to sunlight. ? Foods, such as egg yolks, liver, saltwater fish, and fortified milk. ? Supplements.  Consuming too much calcium may cause: ? Constipation. ? Decreased absorption of iron and zinc. ? Kidney stones.  Calcium supplements may interact with certain medicines. Check with your health care provider before starting any calcium supplements.  Try to get most of your calcium from food. What foods can I eat? Grains  Fortified oatmeal. Fortified ready-to-eat cereals. Fortified frozen waffles. Vegetables Turnip greens. Broccoli. Fruits Fortified orange juice. Meats and Other Protein Sources Canned sardines with bones. Canned salmon with bones.  Soy beans. Tofu. Baked beans. Almonds. [place] nuts. Sunflower seeds. Dairy Milk. Yogurt. Cheese. Cottage cheese. Beverages Fortified soy milk. Fortified rice milk. Sweets/Desserts Pudding. Ice Cream. Milkshakes. Blackstrap molasses. The items listed above may not be a complete list of recommended foods or beverages. Contact your dietitian for more options. What foods can affect my calcium intake? It may be  more difficult for your body to use calcium or calcium may leave your body more quickly if you consume large amounts of:  Sodium.  Protein.  Caffeine.  Alcohol.  This information is not intended to replace advice given to you by your health care provider. Make sure you discuss any questions you have with your health care provider. Document Released: 02/12/2004 Document Revised: 01/18/2016 Document Reviewed: 12/06/2013 Elsevier Interactive Patient Education  2018 [employer].

## 2017-09-01 NOTE — Progress Notes (Signed)
Subjective  CC:  Chief Complaint  Patient presents with  . Establish Care    transfer from [facility] due to location    HPI: Jenny [name] is a 67 y.o. female who presents to [place] at [facility] today to establish care with me as a new patient. I have reviewed her medical records. Last cpe 2016 with labs. Recent consults by GI and pulm.  She has the following concerns or needs:   Overall, healthy 67 year old female who is here to establish care.  Her most recent medical problems include thoracic back pain which after thorough evaluation was found to be related to GERD and LPR.  This resolved with a course of PPI and H2 blockers.  She now only takes the medicines as needed due to her concern of correlation of PPIs with Alzheimer disease.  Her mother and maternal grandmother are suffering from the disease.  She reports a recent EGD and colonoscopy that were normal except for possible Schatzki's ring.  I will request records.  She only intermittently has symptoms at this point.  She is taking famotidine as needed.  History of osteopenia/osteoporosis who failed a trial of Fosamax, this was over 10 years ago.  Last bone density was in 2009.  I reviewed the report.  She tries to get calcium and vitamin D into her diet.  She likes to avoid pills or medications if at all possible.  She stays active.  Her mother does have a history of osteoporosis.  Health maintenance: She is due for her Pneumovax, her mammogram was normal and is up-to-date.  She is due for health maintenance physical with lab work.  She would like to schedule physical.  The 10-year ASCVD risk score Mikey Bussing DC Brooke Bonito., et al., 2013) is: 5.8%   Values used to calculate the score:     Age: 23 years     Sex: Female     Is Non-Hispanic African American: No     Diabetic: No     Tobacco smoker: No     Systolic Blood Pressure: 151 mmHg     Is BP treated: No     HDL Cholesterol: 58.5 mg/dL     Total  Cholesterol: 204 mg/dL  We updated and reviewed the patient's past history in detail and it is documented below.  Patient Active Problem List   Diagnosis Date Noted  . Laryngopharyngeal reflux (LPR) 09/01/2017  . Family history of Alzheimer's disease 10/30/2014  . GERD (gastroesophageal reflux disease) 07/25/2013  . Osteoporosis 06/17/2011   Health Maintenance  Topic Date Due  . Hepatitis C Screening  04/15/1951  . PNA vac Low Risk Adult (2 of 2 - PPSV23) 10/30/2015  . TETANUS/TDAP  07/14/2016  . MAMMOGRAM  06/16/2018  . COLONOSCOPY  07/15/2027  . INFLUENZA VACCINE  Completed  . DEXA SCAN  Completed   Immunization History  Administered Date(s) Administered  . Influenza Split 04/22/2012, 04/24/2013  . Influenza Whole 04/13/2009  . Influenza, High Dose Seasonal PF 04/13/2017  . Influenza-Unspecified 05/03/2014, 04/17/2016  . Pneumococcal Conjugate-13 10/30/2014  . Td 07/14/2006  . Zoster 08/13/2012   Current Meds  Medication Sig  . aspirin 81 MG tablet Take 81 mg by mouth daily. Hold for now  . famotidine (PEPCID) 20 MG tablet One at bedtime  . pantoprazole (PROTONIX) 40 MG tablet Take 1 tablet (40 mg total) by mouth daily. Take 30-60 min before first meal of the day    Allergies: Patient is allergic  to hydrocodeine [dihydrocodeine] and fosamax [alendronate sodium]. Past Medical History Patient  has a past medical history of GERD (gastroesophageal reflux disease), Laryngopharyngeal reflux (LPR) (09/01/2017), and Osteoporosis. Past Surgical History Patient  has a past surgical history that includes Tonsillectomy and Colonoscopy. Family History: Patient family history includes Alzheimer's disease in her maternal grandmother and mother; Arthritis in her brother and brother; Cancer in her father, maternal grandmother, and maternal uncle; Colon cancer in her maternal grandfather and maternal uncle; Healthy in her daughter and son; Heart disease in her mother; Hyperlipidemia in her  mother; Hypertension in her mother; Lung cancer in her maternal aunt; Pulmonary embolism in her brother and mother. Social History:  Patient  reports that  has never smoked. she has never used smokeless tobacco. She reports that she does not drink alcohol or use drugs. 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  Sept 2013    Married   Regular exercise- yes   8 hours of sleep originally from [place]: Constitutional: negative for fever or malaise Ophthalmic: negative for photophobia, double vision or loss of vision Cardiovascular: negative for chest pain, dyspnea on exertion, or new LE swelling Respiratory: negative for SOB or persistent cough Gastrointestinal: negative for abdominal pain, change in bowel habits or melena Genitourinary: negative for dysuria or gross hematuria Musculoskeletal: negative for new gait disturbance or muscular weakness Integumentary: negative for new or persistent rashes Neurological: negative for TIA or stroke symptoms Psychiatric: negative for SI or delusions Allergic/Immunologic: negative for hives  Patient Care Team    Relationship Specialty Notifications Start End  Leamon Arnt, MD PCP - General Family Medicine  09/01/17   Ladene Artist, MD  Gastroenterology  06/16/11    Comment: ? vs Bronwen Betters, MD Consulting Physician Pulmonary Disease  09/01/17     Objective  Vitals: BP 118/80 (BP Location: Left Arm, Patient Position: Sitting, Cuff Size: Normal)   Pulse 76   Temp 97.7 F (36.5 C) (Oral)   Ht 5\' 6"  (1.676 m)   Wt 141 lb 12.8 oz (64.3 kg)   BMI 22.89 kg/m  General:  Well developed, well nourished, no acute distress  Psych:  Alert and oriented,normal mood and affect HEENT:  Normocephalic, atraumatic, non-icteric sclera, PERRL, oropharynx is without mass or exudate, supple neck without adenopathy, mass or thyromegaly Cardiovascular:  RRR  without gallop, rub or murmur, nondisplaced PMI Respiratory:  Good breath sounds bilaterally, CTAB with normal respiratory effort Skin:  Warm, dry Neurologic:    Mental status is normal. Gross motor and sensory exams are normal. Normal gait  Assessment  1. Age-related osteoporosis without current pathological fracture   2. Gastroesophageal reflux disease without esophagitis   3. Family history of Alzheimer's disease   4. Laryngopharyngeal reflux (LPR)      Plan   Recommend bone density scan to follow-up on history of osteoporosis.  Educated on need for calcium and vitamin D and ways to get it in the diet.  We will follow-up on results and consider Boniva or Prolia or Forteo depending on her results.  GERD: Recommend H2 blockers to control symptoms and use as needed for control.  If that does not work, then step up therapy to PPIs.  Recommend annual Medicare wellness visit.  Will check mental status at that time.  She is at risk for late onset Alzheimer's disease.  LPR, same treatment as  noted above for GERD.  Health maintenance: Updated Pneumovax today.  Schedule complete physical.  Follow up:  Return in about 4 weeks (around 09/29/2017) for complete physical.  Commons side effects, risks, benefits, and alternatives for medications and treatment plan prescribed today were discussed, and the patient expressed understanding of the given instructions. Patient is instructed to call or message via MyChart if he/she has any questions or concerns regarding our treatment plan. No barriers to understanding were identified. We discussed Red Flag symptoms and signs in detail. Patient expressed understanding regarding what to do in case of urgent or emergency type symptoms.   Medication list was reconciled, printed and provided to the patient in AVS. Patient instructions and summary information was reviewed with the patient as documented in the AVS. This note was prepared with assistance of Dragon  voice recognition software. Occasional wrong-word or sound-a-like substitutions may have occurred due to the inherent limitations of voice recognition software  Orders Placed This Encounter  Procedures  . DG Bone Density  . Pneumococcal polysaccharide vaccine 23-valent greater than or equal to 2yo subcutaneous/IM   No orders of the defined types were placed in this encounter.

## 2017-09-08 ENCOUNTER — Other Ambulatory Visit: Payer: Self-pay

## 2017-09-08 ENCOUNTER — Ambulatory Visit (INDEPENDENT_AMBULATORY_CARE_PROVIDER_SITE_OTHER): Payer: PPO

## 2017-09-08 ENCOUNTER — Encounter: Payer: Self-pay | Admitting: Family Medicine

## 2017-09-08 ENCOUNTER — Ambulatory Visit (INDEPENDENT_AMBULATORY_CARE_PROVIDER_SITE_OTHER): Payer: PPO | Admitting: Family Medicine

## 2017-09-08 VITALS — BP 102/62 | HR 68 | Temp 98.1°F | Ht 66.0 in | Wt 140.0 lb

## 2017-09-08 VITALS — BP 102/62 | HR 68 | Temp 98.1°F | Ht 66.0 in | Wt 140.4 lb

## 2017-09-08 DIAGNOSIS — Z23 Encounter for immunization: Secondary | ICD-10-CM | POA: Diagnosis not present

## 2017-09-08 DIAGNOSIS — M810 Age-related osteoporosis without current pathological fracture: Secondary | ICD-10-CM | POA: Diagnosis not present

## 2017-09-08 DIAGNOSIS — Z820 Family history of epilepsy and other diseases of the nervous system: Secondary | ICD-10-CM

## 2017-09-08 DIAGNOSIS — Z0000 Encounter for general adult medical examination without abnormal findings: Secondary | ICD-10-CM | POA: Diagnosis not present

## 2017-09-08 DIAGNOSIS — K219 Gastro-esophageal reflux disease without esophagitis: Secondary | ICD-10-CM | POA: Diagnosis not present

## 2017-09-08 LAB — COMPREHENSIVE METABOLIC PANEL
ALBUMIN: 4.6 g/dL (ref 3.5–5.2)
ALT: 16 U/L (ref 0–35)
AST: 16 U/L (ref 0–37)
Alkaline Phosphatase: 97 U/L (ref 39–117)
BUN: 22 mg/dL (ref 6–23)
CALCIUM: 9.9 mg/dL (ref 8.4–10.5)
CHLORIDE: 102 meq/L (ref 96–112)
CO2: 30 mEq/L (ref 19–32)
Creatinine, Ser: 0.75 mg/dL (ref 0.40–1.20)
GFR: 81.67 mL/min (ref >60.00)
Glucose, Bld: 88 mg/dL (ref 70–99)
POTASSIUM: 4 meq/L (ref 3.5–5.1)
Sodium: 135 mEq/L (ref 135–145)
Total Bilirubin: 0.5 mg/dL (ref 0.2–1.2)
Total Protein: 7.1 g/dL (ref 6.0–8.3)

## 2017-09-08 LAB — CBC WITH DIFFERENTIAL/PLATELET
BASOS PCT: 2.5 % (ref 0.0–3.0)
Basophils Absolute: 0.1 10*3/uL (ref 0.0–0.1)
EOS PCT: 2.3 % (ref 0.0–5.0)
Eosinophils Absolute: 0.1 10*3/uL (ref 0.0–0.7)
HCT: 40.4 % (ref 36.0–46.0)
HEMOGLOBIN: 13.6 g/dL (ref 12.0–15.0)
LYMPHS ABS: 1.5 10*3/uL (ref 0.7–4.0)
Lymphocytes Relative: 41 % (ref 12.0–46.0)
MCHC: 33.8 g/dL (ref 30.0–36.0)
MCV: 88.4 fl (ref 78.0–100.0)
MONO ABS: 0.5 10*3/uL (ref 0.1–1.0)
Monocytes Relative: 13.1 % — ABNORMAL HIGH (ref 3.0–12.0)
NEUTROS ABS: 1.5 10*3/uL (ref 1.4–7.7)
Neutrophils Relative %: 41.1 % — ABNORMAL LOW (ref 43.0–77.0)
PLATELETS: 216 10*3/uL (ref 150.0–400.0)
RBC: 4.56 Mil/uL (ref 3.87–5.11)
RDW: 13.3 % (ref 11.5–15.5)
WBC: 3.8 10*3/uL — AB (ref 4.0–10.5)

## 2017-09-08 LAB — LIPID PANEL
CHOLESTEROL: 217 mg/dL — AB (ref 0–200)
HDL: 63.5 mg/dL (ref >39.00)
LDL CALC: 137 mg/dL — AB (ref 0–99)
NonHDL: 153.17
TRIGLYCERIDES: 81 mg/dL (ref 0.0–149.0)
Total CHOL/HDL Ratio: 3
VLDL: 16.2 mg/dL (ref 0.0–40.0)

## 2017-09-08 MED ORDER — ZOSTER VAC RECOMB ADJUVANTED 50 MCG/0.5ML IM SUSR: 0.5000 mL | Freq: Once | INTRAMUSCULAR | 1 refills | Status: AC

## 2017-09-08 NOTE — Patient Instructions (Addendum)
Let us know about Tetanus.   Shingles vaccine at pharmacy.   Bring a copy of your living will and/or healthcare power of attorney to your next office visit.  Continue doing brain stimulating activities (puzzles, reading, adult coloring books, staying active) to keep memory sharp.   Health Maintenance, Female Adopting a healthy lifestyle and getting preventive care can go a long way to promote health and wellness. Talk with your health care provider about what schedule of regular examinations is right for you. This is a good chance for you to check in with your provider about disease prevention and staying healthy. In between checkups, there are plenty of things you can do on your own. Experts have done a lot of research about which lifestyle changes and preventive measures are most likely to keep you healthy. Ask your health care provider for more information. Weight and diet Eat a healthy diet  Be sure to include plenty of vegetables, fruits, low-fat dairy products, and lean protein.  Do not eat a lot of foods high in solid fats, added sugars, or salt.  Get regular exercise. This is one of the most important things you can do for your health. ? Most adults should exercise for at least 150 minutes each week. The exercise should increase your heart rate and make you sweat (moderate-intensity exercise). ? Most adults should also do strengthening exercises at least twice a week. This is in addition to the moderate-intensity exercise.  Maintain a healthy weight  Body mass index (BMI) is a measurement that can be used to identify possible weight problems. It estimates body fat based on height and weight. Your health care provider can help determine your BMI and help you achieve or maintain a healthy weight.  For females 27 years of age and older: ? A BMI below 18.5 is considered underweight. ? A BMI of 18.5 to 24.9 is normal. ? A BMI of 25 to 29.9 is considered overweight. ? A BMI of 30 and  above is considered obese.  Watch levels of cholesterol and blood lipids  You should start having your blood tested for lipids and cholesterol at 68 years of age, then have this test every 5 years.  You may need to have your cholesterol levels checked more often if: ? Your lipid or cholesterol levels are high. ? You are older than 68 years of age. ? You are at high risk for heart disease.  Cancer screening Lung Cancer  Lung cancer screening is recommended for adults 106-28 years old who are at high risk for lung cancer because of a history of smoking.  A yearly low-dose CT scan of the lungs is recommended for people who: ? Currently smoke. ? Have quit within the past 15 years. ? Have at least a 30-pack-year history of smoking. A pack year is smoking an average of one pack of cigarettes a day for 1 year.  Yearly screening should continue until it has been 15 years since you quit.  Yearly screening should stop if you develop a health problem that would prevent you from having lung cancer treatment.  Breast Cancer  Practice breast self-awareness. This means understanding how your breasts normally appear and feel.  It also means doing regular breast self-exams. Let your health care provider know about any changes, no matter how small.  If you are in your 20s or 30s, you should have a clinical breast exam (CBE) by a health care provider every 1-3 years as part of a regular  health exam.  If you are 40 or older, have a CBE every year. Also consider having a breast X-ray (mammogram) every year.  If you have a family history of breast cancer, talk to your health care provider about genetic screening.  If you are at high risk for breast cancer, talk to your health care provider about having an MRI and a mammogram every year.  Breast cancer gene (BRCA) assessment is recommended for women who have family members with BRCA-related cancers. BRCA-related cancers  include: ? Breast. ? Ovarian. ? Tubal. ? Peritoneal cancers.  Results of the assessment will determine the need for genetic counseling and BRCA1 and BRCA2 testing.  Cervical Cancer Your health care provider may recommend that you be screened regularly for cancer of the pelvic organs (ovaries, uterus, and vagina). This screening involves a pelvic examination, including checking for microscopic changes to the surface of your cervix (Pap test). You may be encouraged to have this screening done every 3 years, beginning at age 21.  For women ages 30-65, health care providers may recommend pelvic exams and Pap testing every 3 years, or they may recommend the Pap and pelvic exam, combined with testing for human papilloma virus (HPV), every 5 years. Some types of HPV increase your risk of cervical cancer. Testing for HPV may also be done on women of any age with unclear Pap test results.  Other health care providers may not recommend any screening for nonpregnant women who are considered low risk for pelvic cancer and who do not have symptoms. Ask your health care provider if a screening pelvic exam is right for you.  If you have had past treatment for cervical cancer or a condition that could lead to cancer, you need Pap tests and screening for cancer for at least 20 years after your treatment. If Pap tests have been discontinued, your risk factors (such as having a new sexual partner) need to be reassessed to determine if screening should resume. Some women have medical problems that increase the chance of getting cervical cancer. In these cases, your health care provider may recommend more frequent screening and Pap tests.  Colorectal Cancer  This type of cancer can be detected and often prevented.  Routine colorectal cancer screening usually begins at 68 years of age and continues through 68 years of age.  Your health care provider may recommend screening at an earlier age if you have risk factors  for colon cancer.  Your health care provider may also recommend using home test kits to check for hidden blood in the stool.  A small camera at the end of a tube can be used to examine your colon directly (sigmoidoscopy or colonoscopy). This is done to check for the earliest forms of colorectal cancer.  Routine screening usually begins at age 50.  Direct examination of the colon should be repeated every 5-10 years through 68 years of age. However, you may need to be screened more often if early forms of precancerous polyps or small growths are found.  Skin Cancer  Check your skin from head to toe regularly.  Tell your health care provider about any new moles or changes in moles, especially if there is a change in a mole's shape or color.  Also tell your health care provider if you have a mole that is larger than the size of a pencil eraser.  Always use sunscreen. Apply sunscreen liberally and repeatedly throughout the day.  Protect yourself by wearing long sleeves, pants, a   wide-brimmed hat, and sunglasses whenever you are outside.  Heart disease, diabetes, and high blood pressure  High blood pressure causes heart disease and increases the risk of stroke. High blood pressure is more likely to develop in: ? People who have blood pressure in the high end of the normal range (130-139/85-89 mm Hg). ? People who are overweight or obese. ? People who are African American.  If you are 54-30 years of age, have your blood pressure checked every 3-5 years. If you are 3 years of age or older, have your blood pressure checked every year. You should have your blood pressure measured twice-once when you are at a hospital or clinic, and once when you are not at a hospital or clinic. Record the average of the two measurements. To check your blood pressure when you are not at a hospital or clinic, you can use: ? An automated blood pressure machine at a pharmacy. ? A home blood pressure monitor.  If  you are between 55 years and 21 years old, ask your health care provider if you should take aspirin to prevent strokes.  Have regular diabetes screenings. This involves taking a blood sample to check your fasting blood sugar level. ? If you are at a normal weight and have a low risk for diabetes, have this test once every three years after 68 years of age. ? If you are overweight and have a high risk for diabetes, consider being tested at a younger age or more often. Preventing infection Hepatitis B  If you have a higher risk for hepatitis B, you should be screened for this virus. You are considered at high risk for hepatitis B if: ? You were born in a country where hepatitis B is common. Ask your health care provider which countries are considered high risk. ? Your parents were born in a high-risk country, and you have not been immunized against hepatitis B (hepatitis B vaccine). ? You have HIV or AIDS. ? You use needles to inject street drugs. ? You live with someone who has hepatitis B. ? You have had sex with someone who has hepatitis B. ? You get hemodialysis treatment. ? You take certain medicines for conditions, including cancer, organ transplantation, and autoimmune conditions.  Hepatitis C  Blood testing is recommended for: ? Everyone born from [birth year] through 1966. ? Anyone with known risk factors for hepatitis C.  Sexually transmitted infections (STIs)  You should be screened for sexually transmitted infections (STIs) including gonorrhea and chlamydia if: ? You are sexually active and are younger than 68 years of age. ? You are older than 68 years of age and your health care provider tells you that you are at risk for this type of infection. ? Your sexual activity has changed since you were last screened and you are at an increased risk for chlamydia or gonorrhea. Ask your health care provider if you are at risk.  If you do not have HIV, but are at risk, it may be recommended  that you take a prescription medicine daily to prevent HIV infection. This is called pre-exposure prophylaxis (PrEP). You are considered at risk if: ? You are sexually active and do not regularly use condoms or know the HIV status of your partner(s). ? You take drugs by injection. ? You are sexually active with a partner who has HIV.  Talk with your health care provider about whether you are at high risk of being infected with HIV. If you choose to  begin PrEP, you should first be tested for HIV. You should then be tested every 3 months for as long as you are taking PrEP. Pregnancy  If you are premenopausal and you may become pregnant, ask your health care provider about preconception counseling.  If you may become pregnant, take 400 to 800 micrograms (mcg) of folic acid every day.  If you want to prevent pregnancy, talk to your health care provider about birth control (contraception). Osteoporosis and menopause  Osteoporosis is a disease in which the bones lose minerals and strength with aging. This can result in serious bone fractures. Your risk for osteoporosis can be identified using a bone density scan.  If you are 65 years of age or older, or if you are at risk for osteoporosis and fractures, ask your health care provider if you should be screened.  Ask your health care provider whether you should take a calcium or vitamin D supplement to lower your risk for osteoporosis.  Menopause may have certain physical symptoms and risks.  Hormone replacement therapy may reduce some of these symptoms and risks. Talk to your health care provider about whether hormone replacement therapy is right for you. Follow these instructions at home:  Schedule regular health, dental, and eye exams.  Stay current with your immunizations.  Do not use any tobacco products including cigarettes, chewing tobacco, or electronic cigarettes.  If you are pregnant, do not drink alcohol.  If you are  breastfeeding, limit how much and how often you drink alcohol.  Limit alcohol intake to no more than 1 drink per day for nonpregnant women. One drink equals 12 ounces of beer, 5 ounces of wine, or 1 ounces of hard liquor.  Do not use street drugs.  Do not share needles.  Ask your health care provider for help if you need support or information about quitting drugs.  Tell your health care provider if you often feel depressed.  Tell your health care provider if you have ever been abused or do not feel safe at home. This information is not intended to replace advice given to you by your health care provider. Make sure you discuss any questions you have with your health care provider. Document Released: 01/13/2011 Document Revised: 12/06/2015 Document Reviewed: 04/03/2015 Elsevier Interactive Patient Education  2018 [employer]  

## 2017-09-08 NOTE — Patient Instructions (Addendum)
Please return for AWV with Jenny [name]. Then 12 months for recheck with me for your complete physical.   Please call your insurance company and ask if the Tdap vaccination is a covered benefit. Then call to come get it if it is.    If you have any questions or concerns, please don't hesitate to send me a message via MyChart or call the office at [phone]. Thank you for visiting with [place] today! It's our pleasure caring for you.  Medicare recommends an Annual Wellness Visit for all patients. Please schedule this to be done with our Nurse Educator, Jenny [name]. This is an informative "talk" visit; it's goals are to ensure that your health care needs are being met and to give you education regarding avoiding falls, ensuring you are not suffering from depression or problems with memory or thinking, and to educate you on Advance Care Planning. It helps me take good care of you!  Please do these things to maintain good health!   Exercise at least 30-45 minutes a day,  4-5 days a week.   Eat a low-fat diet with lots of fruits and vegetables, up to 7-9 servings per day.  Drink plenty of water daily. Try to drink 8 8oz glasses per day.  Seatbelts can save your life. Always wear your seatbelt.  Place Smoke Detectors on every level of your home and check batteries every year.  Schedule an appointment with an eye doctor for an eye exam every 1-2 years  Safe sex - use condoms to protect yourself from STDs if you could be exposed to these types of infections. Use birth control if you do not want to become pregnant and are sexually active.  Avoid heavy alcohol use. If you drink, keep it to less than 2 drinks/day and not every day.  [place].  Choose someone you trust that could speak for you if you became unable to speak for yourself.  Depression is common in our stressful world.If you're feeling down or losing interest in things you normally enjoy, please come in for a visit.  If anyone is  threatening or hurting you, please get help. Physical or Emotional Violence is never OK.

## 2017-09-08 NOTE — Progress Notes (Signed)
Subjective  Chief Complaint  Patient presents with  . Annual Exam    Patient is fasting, had black coffee, last physical 2 years ago     HPI: Jenny [name] is a 67 y.o. female who presents to [place] at [facility] today for a Female Wellness Visit.   Wellness Visit: annual visit with health maintenance review and exam without Pap   67 year old for female wellness visit today.  Was here last week for establishing care.  I reviewed that note.  Overall she continues to do well.  She has no new concerns today. Lifestyle: Body mass index is 22.66 kg/m. Wt Readings from Last 3 Encounters:  09/08/17 140 lb 6.4 oz (63.7 kg)  09/01/17 141 lb 12.8 oz (64.3 kg)  08/07/16 138 lb 8 oz (62.8 kg)   Diet: low fat Exercise: intermittently, gardening and walking  Patient Active Problem List   Diagnosis Date Noted  . Laryngopharyngeal reflux (LPR) 09/01/2017  . Family history of Alzheimer's disease 10/30/2014  . GERD (gastroesophageal reflux disease) 07/25/2013  . Osteoporosis 06/17/2011   Health Maintenance  Topic Date Due  . Hepatitis C Screening  [birth year]  . DEXA SCAN  02/28/2011  . TETANUS/TDAP  07/14/2016  . MAMMOGRAM  06/16/2018  . COLONOSCOPY  07/15/2027  . INFLUENZA VACCINE  Completed  . PNA vac Low Risk Adult  Completed   Immunization History  Administered Date(s) Administered  . Influenza Split 04/22/2012, 04/24/2013  . Influenza Whole 04/13/2009  . Influenza, High Dose Seasonal PF 04/13/2017  . Influenza-Unspecified 05/03/2014, 04/17/2016  . Pneumococcal Conjugate-13 10/30/2014  . Pneumococcal Polysaccharide-23 09/01/2017  . Td 07/14/2006  . Zoster 08/13/2012   We updated and reviewed the patient's past history in detail and it is documented below. Allergies: Patient is allergic to hydrocodeine [dihydrocodeine] and fosamax [alendronate sodium]. Past Medical History Patient  has a past medical history of GERD (gastroesophageal reflux  disease), Laryngopharyngeal reflux (LPR) (09/01/2017), and Osteoporosis. Past Surgical History Patient  has a past surgical history that includes Tonsillectomy and Colonoscopy. Family History: Patient family history includes Alzheimer's disease in her maternal grandmother and mother; Arthritis in her brother and brother; Cancer in her father, maternal grandmother, and maternal uncle; Colon cancer in her maternal grandfather and maternal uncle; Healthy in her daughter and son; Heart disease in her mother; Hyperlipidemia in her mother; Hypertension in her mother; Lung cancer in her maternal aunt; Pulmonary embolism in her brother and mother. Social History:  Patient  reports that  has never smoked. she has never used smokeless tobacco. She reports that she does not drink alcohol or use drugs.  Review of Systems: Constitutional: negative for fever or malaise Ophthalmic: negative for photophobia, double vision or loss of vision Cardiovascular: negative for chest pain, dyspnea on exertion, or new LE swelling Respiratory: negative for SOB or persistent cough Gastrointestinal: negative for abdominal pain, change in bowel habits or melena Genitourinary: negative for dysuria or gross hematuria, no abnormal uterine bleeding or disharge Musculoskeletal: negative for new gait disturbance or muscular weakness Integumentary: negative for new or persistent rashes, no breast lumps Neurological: negative for TIA or stroke symptoms Psychiatric: negative for SI or delusions Allergic/Immunologic: negative for hives  Patient Care Team    Relationship Specialty Notifications Start End  Leamon Arnt, MD PCP - General Family Medicine  09/01/17   Ladene Artist, MD  Gastroenterology  06/16/11    Comment: ? vs Bronwen Betters, MD Consulting Physician Pulmonary Disease  09/01/17  Objective  Vitals: BP 102/62   Pulse 68   Temp 98.1 F (36.7 C)   Ht 5\' 6"  (1.676 m)   Wt 140 lb 6.4 oz (63.7 kg)    BMI 22.66 kg/m  General:  Well developed, well nourished, no acute distress  Psych:  Alert and orientedx3,normal mood and affect HEENT:  Normocephalic, atraumatic, non-icteric sclera, PERRL, oropharynx is clear without mass or exudate, supple neck without adenopathy, mass or thyromegaly Cardiovascular:  Normal S1, S2, RRR without gallop, rub or murmur, nondisplaced PMI Respiratory:  Good breath sounds bilaterally, CTAB with normal respiratory effort Gastrointestinal: normal bowel sounds, soft, non-tender, no noted masses. No HSM MSK: no deformities, contusions. Joints are without erythema or swelling. Spine and CVA region are nontender Skin:  Warm, no rashes or suspicious lesions noted, she has multiple skin sun related skin changes Neurologic:    Mental status is normal. CN 2-11 are normal. Gross motor and sensory exams are normal. Normal gait. No tremor Breast Exam: No mass, skin retraction or nipple discharge is appreciated in either breast. No axillary adenopathy. Fibrocystic changes are not noted  Assessment  1. Annual physical exam   2. Family history of Alzheimer's disease   3. Laryngopharyngeal reflux (LPR)   4. Age-related osteoporosis without current pathological fracture      Plan  Female Wellness Visit:  Age appropriate Health Maintenance and Prevention measures were discussed with patient. Included topics are cancer screening recommendations, ways to keep healthy (see AVS) including dietary and exercise recommendations, regular eye and dental care, use of seat belts, and avoidance of moderate alcohol use and tobacco use.   BMI: discussed patient's BMI and encouraged positive lifestyle modifications to help get to or maintain a target BMI.  HM needs and immunizations were addressed and ordered. See below for orders. See HM and immunization section for updates.  Routine labs and screening tests ordered including cmp, cbc and lipids where appropriate.  Discussed  recommendations regarding Vit D and calcium supplementation (see AVS)  Patient to get bone density scan for follow-up of history of osteoporosis.  Will address medications at that time if needed.  Continue H2 blocker as needed for LPR.  Follow up: Return in about 12 months (around 09/08/2018) for complete physical, AWV at patient's convenience.   Commons side effects, risks, benefits, and alternatives for medications and treatment plan prescribed today were discussed, and the patient expressed understanding of the given instructions. Patient is instructed to call or message via MyChart if he/she has any questions or concerns regarding our treatment plan. No barriers to understanding were identified. We discussed Red Flag symptoms and signs in detail. Patient expressed understanding regarding what to do in case of urgent or emergency type symptoms.   Medication list was reconciled, printed and provided to the patient in AVS. Patient instructions and summary information was reviewed with the patient as documented in the AVS. This note was prepared with assistance of Dragon voice recognition software. Occasional wrong-word or sound-a-like substitutions may have occurred due to the inherent limitations of voice recognition software  Orders Placed This Encounter  Procedures  . CBC with Differential/Platelet  . Comprehensive metabolic panel  . Lipid panel  . Hepatitis C Antibody   No orders of the defined types were placed in this encounter.

## 2017-09-08 NOTE — Progress Notes (Signed)
Subjective:   Jenny [name] is a 67 y.o. female who presents for Medicare Annual (Subsequent) preventive examination.  Review of Systems:  No ROS.  Medicare Wellness Visit. Additional risk factors are reflected in the social history.  Cardiac Risk Factors include: advanced age (>2men, >46 women);family history of premature cardiovascular disease   Sleep patterns:  Sleeps well.  Home Safety/Smoke Alarms: Feels safe in home. Smoke alarms in place.  Living environment; residence and Firearm Safety: Lives with husband in 3 story home.  Seat Belt Safety/Bike Helmet: Wears seat belt.   Female:   [id number]       Mammo-06/16/2017, negative.       Dexa scan-2009. Ordered today by PCP [facility]-Colonoscopy 09/24/2015, pt reports normal.      Objective:     Vitals: BP 102/62   Pulse 68   Temp 98.1 F (36.7 C) (Oral)   Ht 5\' 6"  (1.676 m)   Wt 140 lb (63.5 kg)   BMI 22.60 kg/m   Body mass index is 22.6 kg/m.  Advanced Directives 09/08/2017  Does Patient Have a Medical Advance Directive? Yes  Type of Advance Directive Living will;Healthcare Power of [place] in Chart? No - copy requested    Tobacco Social History   Tobacco Use  Smoking Status Never Smoker  Smokeless Tobacco Never Used     Counseling given: Not Answered    Past Medical History:  Diagnosis Date  . GERD (gastroesophageal reflux disease)   . Laryngopharyngeal reflux (LPR) 09/01/2017   Had eval by GI and pulm  . Osteoporosis    by dexa 2009 had se of fosamax   Past Surgical History:  Procedure Laterality Date  . COLONOSCOPY    . TONSILLECTOMY     age 66   Family History  Problem Relation Age of Onset  . Hyperlipidemia Mother   . Heart disease Mother   . Hypertension Mother   . Alzheimer's disease Mother   . Pulmonary embolism Mother   . Cancer Father        unsure of type  . Pulmonary embolism Brother   . Arthritis Brother   . Arthritis Brother   .  Alzheimer's disease Maternal Grandmother   . Cancer Maternal Grandmother   . Healthy Daughter   . Healthy Son   . Lung cancer Maternal Aunt        non smoker  . Cancer Maternal Uncle   . Colon cancer Maternal Uncle   . Colon cancer Maternal Grandfather    Social History   Socioeconomic History  . Marital status: Married    Spouse name: Jenny [name]  . Number of children: 2  . Years of education: Not on file  . Highest education level: Not on file  Social Needs  . Financial resource strain: Not on file  . Food insecurity - worry: Not on file  . Food insecurity - inability: Not on file  . Transportation needs - medical: Not on file  . Transportation needs - non-medical: Not on file  Occupational History  . 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 Smoking status: Never Smoker  . Smokeless tobacco: Never Used  Substance and Sexual Activity  . Alcohol use: No    Alcohol/week: 0.0 oz  . Drug use: No  . Sexual activity: Not Currently    Birth control/protection: Post-menopausal  Other Topics Concern  . 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 famotidine (PEPCID) 20 MG tablet One at bedtime  . pantoprazole (PROTONIX) 40 MG tablet Take 1 tablet (40 mg total) by mouth daily. Take 30-60 min before first meal of the day  . Zoster Vaccine Adjuvanted [facility]) injection Inject 0.5 mLs into the muscle once for 1 dose.   No facility-administered encounter medications on file as of 09/08/2017.     Activities of Daily Living In your present state of health, do you have any difficulty performing the following activities: 09/08/2017 09/08/2017  Hearing? N N  Vision? N N  Difficulty concentrating or making decisions? N N  Walking or climbing  stairs? N N  Dressing or bathing? N N  Doing errands, shopping? N N  Preparing Food and eating ? N -  Using the Toilet? N -  In the past six months, have you accidently leaked urine? N -  Do you have problems with loss of bowel control? N -  Managing your Medications? N -  Managing your Finances? N -  Housekeeping or managing your Housekeeping? N -  Some recent data might be hidden    Patient Care Team: Leamon Arnt, MD as PCP - General (Family Medicine) Ladene Artist, MD (Gastroenterology) Tanda Rockers, MD as Consulting Physician (Pulmonary Disease)    Assessment:   This is a routine wellness examination for Jenny [name].  Exercise Activities and Dietary recommendations Current Exercise Habits: Home exercise routine, Type of exercise: treadmill;walking, Time (Minutes): 20, Frequency (Times/Week): 4, Weekly Exercise (Minutes/Week): 80, Exercise limited by: None identified   Diet (meal preparation, eat out, water intake, caffeinated beverages, dairy products, fruits and vegetables): Drinks water.   Eats heart healthy 3 meals/day.      Goals    . Patient Stated     Increase memory skills to prevent memory loss.        Fall Risk Fall Risk  09/08/2017 09/08/2017 09/01/2017 10/30/2014  Falls in the past year? No No No No    Depression Screen PHQ 2/9 Scores 09/08/2017 09/08/2017 09/08/2017 09/01/2017  PHQ - 2 Score 0 0 0 0  PHQ- 9 Score - - 0 -     Cognitive Function       Ad8 score reviewed for issues:  Issues making decisions: no  Less interest in hobbies / activities: no  Repeats questions, stories (family complaining): no  Trouble using ordinary gadgets (microwave, computer, phone): no  Forgets the month or year: no  Mismanaging finances: no  Remembering appts: no  Daily problems with thinking and/or memory: no Ad8 score is=0     Immunization History  Administered Date(s) Administered  . Influenza Split 04/22/2012, 04/24/2013  . Influenza Whole  04/13/2009  . Influenza, High Dose Seasonal PF 04/13/2017  . Influenza-Unspecified 05/03/2014, 04/17/2016  . Pneumococcal Conjugate-13 10/30/2014  . Pneumococcal Polysaccharide-23 09/01/2017  . Td 07/14/2006  . Zoster 08/13/2012      Screening Tests Health Maintenance  Topic Date Due  . Hepatitis C Screening  02/15/1951  . DEXA SCAN  02/28/2011  . TETANUS/TDAP  07/14/2016  . MAMMOGRAM  06/16/2018  . COLONOSCOPY  07/15/2027  . INFLUENZA VACCINE  Completed  . PNA vac Low Risk Adult  Completed  Plan:     Let us know about Tetanus.   Shingles vaccine at pharmacy.   Bring a copy of your living will and/or healthcare power of attorney to your next office visit.  Continue doing brain stimulating activities (puzzles, reading, adult coloring books, staying active) to keep memory sharp.   I have personally reviewed and noted the following in the patient's chart:    . Medical and social history . Use of alcohol, tobacco or illicit drugs  . Current medications and supplements . Functional ability and status . Nutritional status . Physical activity . Advanced directives . List of other physicians . Hospitalizations, surgeries, and ER visits in previous 12 months . Vitals . Screenings to include cognitive, depression, and falls . Referrals and appointments  In addition, I have reviewed and discussed with patient certain preventive protocols, quality metrics, and best practice recommendations. A written personalized care plan for preventive services as well as general preventive health recommendations were provided to patient.     Gerilyn Nestle, RN  09/08/2017

## 2017-09-09 ENCOUNTER — Telehealth: Payer: Self-pay | Admitting: Emergency Medicine

## 2017-09-09 ENCOUNTER — Encounter: Payer: Self-pay | Admitting: Family Medicine

## 2017-09-09 LAB — HEPATITIS C ANTIBODY
HEP C AB: NONREACTIVE
SIGNAL TO CUT-OFF: 0.03 (ref <1.00)

## 2017-09-09 NOTE — Telephone Encounter (Signed)
Patient states that the Pharmacy stated that the Shingles vaccine is on back order until June. Patient will wait until May to send prescription to the pharmacy. Reminder created and I will follow up with patient then. Patient verbalized understanding. AP, LPN

## 2017-09-09 NOTE — Progress Notes (Signed)
I have reviewed the documentation from the recent AWV done by [name] Broome; I agree with the documentation and will follow up on any recommendations or abnormal findings as suggested.  

## 2017-09-09 NOTE — Telephone Encounter (Signed)
Please order shingrix and send to her pharmacy.

## 2017-09-09 NOTE — Telephone Encounter (Signed)
Copied from [place]. Topic: Inquiry >> Sep 08, 2017  1:15 PM Arletha Grippe wrote: Reason for CRM:  pt was told by dr Jonni Sanger to get this info She has no record of tetanus shot Her copay for shingles vaccine is $45 and she would like to get that ordered   Dr Jonni Sanger,    Per Keane Scrape, Shringix is on Back Order, however we can send to patient to pharmacy for Shingles Vaccine, please advise. AP, LPN

## 2017-09-18 ENCOUNTER — Ambulatory Visit (INDEPENDENT_AMBULATORY_CARE_PROVIDER_SITE_OTHER): Payer: PPO | Admitting: *Deleted

## 2017-09-18 DIAGNOSIS — Z23 Encounter for immunization: Secondary | ICD-10-CM

## 2018-01-15 ENCOUNTER — Telehealth: Payer: Self-pay | Admitting: Emergency Medicine

## 2018-01-15 ENCOUNTER — Encounter: Payer: Self-pay | Admitting: Emergency Medicine

## 2018-01-15 MED ORDER — ZOSTER VAC RECOMB ADJUVANTED 50 MCG/0.5ML IM SUSR: 0.5000 mL | Freq: Once | INTRAMUSCULAR | 0 refills | Status: AC

## 2018-01-15 NOTE — Addendum Note (Signed)
Addended bySigurd Sos on: 01/15/2018 02:06 PM   Modules accepted: Orders

## 2018-01-15 NOTE — Telephone Encounter (Signed)
Error

## 2018-01-15 NOTE — Telephone Encounter (Signed)
Patient had [employer] and she was told that she was in network here at our office but she received a large bill.   Please advise.

## 2018-01-15 NOTE — Telephone Encounter (Addendum)
LM to [facility] for patient to call back to schedule Shingles vaccine.   CRM Created.    Will send RX for Shingles Vaccine to [facility]- [place]  Doloris Hall,  LPN

## 2018-05-14 ENCOUNTER — Other Ambulatory Visit: Payer: Self-pay | Admitting: Family Medicine

## 2018-05-14 ENCOUNTER — Other Ambulatory Visit: Payer: Self-pay

## 2018-05-14 ENCOUNTER — Other Ambulatory Visit: Payer: Self-pay | Admitting: Internal Medicine

## 2018-05-14 DIAGNOSIS — Z1231 Encounter for screening mammogram for malignant neoplasm of breast: Secondary | ICD-10-CM

## 2018-05-24 ENCOUNTER — Telehealth: Payer: Self-pay | Admitting: Emergency Medicine

## 2018-05-24 DIAGNOSIS — H539 Unspecified visual disturbance: Secondary | ICD-10-CM

## 2018-05-24 NOTE — Telephone Encounter (Signed)
Patient called back stating that she had forgotten to tell Amy that while scheduling, it needs to be a provider in network with [facility].

## 2018-05-24 NOTE — Telephone Encounter (Signed)
Received Documentation from Leslie Dales, MD regarding Eye exam. Dr. Noel Journey recommended B Carotid Dopplers. Patient informed and advised we will call her once  We get scheduled.   Doloris Hall,  LPN

## 2018-05-25 NOTE — Telephone Encounter (Signed)
Order placed at [facility]. Will Call patient with appt.

## 2018-05-27 ENCOUNTER — Other Ambulatory Visit: Payer: PPO

## 2018-05-31 ENCOUNTER — Ambulatory Visit
Admission: RE | Admit: 2018-05-31 | Discharge: 2018-05-31 | Disposition: A | Discharge status: Home / Self Care | Payer: PPO | Source: Ambulatory Visit | Attending: Family Medicine | Admitting: Family Medicine

## 2018-05-31 ENCOUNTER — Telehealth: Payer: Self-pay | Admitting: Family Medicine

## 2018-05-31 DIAGNOSIS — I6523 Occlusion and stenosis of bilateral carotid arteries: Secondary | ICD-10-CM | POA: Diagnosis not present

## 2018-05-31 DIAGNOSIS — H539 Unspecified visual disturbance: Secondary | ICD-10-CM

## 2018-05-31 NOTE — Progress Notes (Signed)
Please call patient: I have reviewed his/her lab results.  Please let patient know that her ultrasounds of her carotid arteries were normal.

## 2018-05-31 NOTE — Telephone Encounter (Signed)
Copied from [place] [id number]. Topic: Quick Sport and exercise psychologist Patient (Clinic Use ONLY) >> May 31, 2018  1:18 PM Jodell Cipro, CMA wrote: Reason for CRM: LMOVM instructing pt to call back to discuss carotid ultrasound results. Okay to discuss with pt.

## 2018-06-01 NOTE — Telephone Encounter (Signed)
LMOVM for pt to return call 

## 2018-06-01 NOTE — Telephone Encounter (Signed)
Pt called again to get these results.  Per NT this needs to be addressed by the office.

## 2018-06-03 NOTE — Telephone Encounter (Signed)
Pt is still very concerned about the hemorrhaging in her eye. Asking if there is any other tests that can be done to see what is causing the bleeding.

## 2018-06-03 NOTE — Telephone Encounter (Signed)
Please schedule an office visit to discuss. Please get notes from eye doctor as well.  Thanks.

## 2018-06-04 ENCOUNTER — Encounter: Payer: Self-pay | Admitting: Family Medicine

## 2018-06-04 ENCOUNTER — Ambulatory Visit (INDEPENDENT_AMBULATORY_CARE_PROVIDER_SITE_OTHER): Payer: PPO | Admitting: Family Medicine

## 2018-06-04 ENCOUNTER — Other Ambulatory Visit: Payer: Self-pay

## 2018-06-04 VITALS — BP 112/78 | HR 78 | Temp 97.6°F | Ht 66.0 in | Wt 141.4 lb

## 2018-06-04 DIAGNOSIS — H3563 Retinal hemorrhage, bilateral: Secondary | ICD-10-CM | POA: Diagnosis not present

## 2018-06-04 LAB — POCT GLYCOSYLATED HEMOGLOBIN (HGB A1C): HEMOGLOBIN A1C: 5.2 % (ref 4.0–5.6)

## 2018-06-04 NOTE — Progress Notes (Signed)
Subjective  CC:  Chief Complaint  Patient presents with  . Follow-up    on Hemorrhaging of eye. Neck Ultrasound was normal    HPI: Jenny [name] is a 67 y.o. female who presents to the office today to address the problems listed above in the chief complaint.  Reviewed note from optometrist: had "peripheral dot hemorrhages" in left eye. She recommended a carotid doppler and r/o anemia and diabetes. Carotids were normal. She does not have anemia or diabetes. Pt wants to know what to do now.   She has no visual sxs. No trauma. I do not have the  ICD-9 dx code. She feels well. No h/o HTN. Not on blood thinners.  Assessment  1. Retinal hemorrhage of both eyes      Plan   "dot hemorrhages":  I recommend an eye exam from ophthalmology for further input. R/o diabetes and anemia today with labs. Referral placed.   Follow up: feb for cpe   Orders Placed This Encounter  Procedures  . CBC with Differential/Platelet  . Ambulatory referral to Ophthalmology  . POCT glycosylated hemoglobin (Hb A1C)   No orders of the defined types were placed in this encounter.     I reviewed the patients updated PMH, FH, and SocHx.    Patient Active Problem List   Diagnosis Date Noted  . Laryngopharyngeal reflux (LPR) 09/01/2017  . Family history of Alzheimer's disease 10/30/2014  . GERD (gastroesophageal reflux disease) 07/25/2013  . Osteoporosis 06/17/2011   Current Meds  Medication Sig  . famotidine (PEPCID) 20 MG tablet One at bedtime  . pantoprazole (PROTONIX) 40 MG tablet Take 1 tablet (40 mg total) by mouth daily. Take 30-60 min before first meal of the day    Allergies: Patient is allergic to hydrocodeine [dihydrocodeine] and fosamax [alendronate sodium]. Family History: Patient family history includes Alzheimer's disease in her maternal grandmother and mother; Arthritis in her brother and brother; Cancer in her father, maternal grandmother, and maternal uncle; Colon cancer in her  maternal grandfather and maternal uncle; Healthy in her daughter and son; Heart disease in her mother; Hyperlipidemia in her mother; Hypertension in her mother; Lung cancer in her maternal aunt; Pulmonary embolism in her brother and mother. Social History:  Patient  reports that she has never smoked. She has never used smokeless tobacco. She reports that she does not drink alcohol or use drugs.  Review of Systems: Constitutional: Negative for fever malaise or anorexia Cardiovascular: negative for chest pain Respiratory: negative for SOB or persistent cough Gastrointestinal: negative for abdominal pain  Objective  Vitals: BP 112/78   Pulse 78   Temp 97.6 F (36.4 C)   Ht 5\' 6"  (1.676 m)   Wt 141 lb 6.4 oz (64.1 kg)   SpO2 99%   BMI 22.82 kg/m  General: no acute distress , A&Ox3 HEENT: PEERL, conjunctiva normal, Eye exam: fundoscopic appears normal to me on right but small black punctate lesions in periphery of left retina    Commons side effects, risks, benefits, and alternatives for medications and treatment plan prescribed today were discussed, and the patient expressed understanding of the given instructions. Patient is instructed to call or message via MyChart if he/she has any questions or concerns regarding our treatment plan. No barriers to understanding were identified. We discussed Red Flag symptoms and signs in detail. Patient expressed understanding regarding what to do in case of urgent or emergency type symptoms.   Medication list was reconciled, printed and provided to the  patient in AVS. Patient instructions and summary information was reviewed with the patient as documented in the AVS. This note was prepared with assistance of Dragon voice recognition software. Occasional wrong-word or sound-a-like substitutions may have occurred due to the inherent limitations of voice recognition software

## 2018-06-04 NOTE — Patient Instructions (Signed)
Please return in February for your annual complete physical; please come fasting.  We will call you with information regarding your referral appointment. Ophthalmologist.  If you do not hear from [place] within the next 2 weeks, please let me know. It can take 1-2 weeks to get appointments set up with the specialists.    If you have any questions or concerns, please don't hesitate to send me a message via MyChart or call the office at [phone]. Thank you for visiting with [place] today! It's our pleasure caring for you.

## 2018-06-05 LAB — SPECIMEN STATUS REPORT

## 2018-06-05 NOTE — Progress Notes (Signed)
Please call patient: I have reviewed his/her lab results. Lab results are normal. No diabetes or anemia. F/u with ophthalmology as discussed. Referral placed.

## 2018-06-11 LAB — CBC WITH DIFFERENTIAL/PLATELET
BASOS ABS: 0.1 10*3/uL (ref 0.0–0.2)
BASOS: 1 %
EOS (ABSOLUTE): 0.1 10*3/uL (ref 0.0–0.4)
Eos: 1 %
Hematocrit: 37.7 % (ref 34.0–46.6)
Hemoglobin: 12.9 g/dL (ref 11.1–15.9)
IMMATURE GRANULOCYTES: 0 %
Immature Grans (Abs): 0 10*3/uL (ref 0.0–0.1)
LYMPHS: 29 %
Lymphocytes Absolute: 1.6 10*3/uL (ref 0.7–3.1)
MCH: 28.9 pg (ref 26.6–33.0)
MCHC: 34.2 g/dL (ref 31.5–35.7)
MCV: 84 fL (ref 79–97)
Monocytes Absolute: 0.4 10*3/uL (ref 0.1–0.9)
Monocytes: 8 %
NEUTROS PCT: 61 %
Neutrophils Absolute: 3.3 10*3/uL (ref 1.4–7.0)
PLATELETS: 242 10*3/uL (ref 150–450)
RBC: 4.47 x10E6/uL (ref 3.77–5.28)
RDW: 12.4 % (ref 12.3–15.4)
WBC: 5.5 10*3/uL (ref 3.4–10.8)

## 2018-06-11 LAB — SPECIMEN STATUS REPORT

## 2018-06-23 ENCOUNTER — Ambulatory Visit: Payer: PPO

## 2018-06-28 DIAGNOSIS — H35041 Retinal micro-aneurysms, unspecified, right eye: Secondary | ICD-10-CM | POA: Diagnosis not present

## 2018-06-28 DIAGNOSIS — H35372 Puckering of macula, left eye: Secondary | ICD-10-CM | POA: Diagnosis not present

## 2018-06-28 DIAGNOSIS — H43813 Vitreous degeneration, bilateral: Secondary | ICD-10-CM | POA: Diagnosis not present

## 2018-07-21 ENCOUNTER — Ambulatory Visit
Admission: RE | Admit: 2018-07-21 | Discharge: 2018-07-21 | Disposition: A | Discharge status: Home / Self Care | Payer: PPO | Source: Ambulatory Visit | Attending: Family Medicine | Admitting: Family Medicine

## 2018-07-21 DIAGNOSIS — Z1231 Encounter for screening mammogram for malignant neoplasm of breast: Secondary | ICD-10-CM

## 2018-09-08 NOTE — Progress Notes (Signed)
Subjective:   Jenny [name] is a 68 y.o. female who presents for Medicare Annual (Subsequent) preventive examination.  Review of Systems:  No ROS.  Medicare Wellness Visit. Additional risk factors are reflected in the social history.  Cardiac Risk Factors include: advanced age (>52men, >70 women)   Sleep patterns: Sleeps 6 hours.  Home Safety/Smoke Alarms: Feels safe in home. Smoke alarms in place.  Living environment; residence and Firearm Safety: Lives with husband in 3 story home.  Seat Belt Safety/Bike Helmet: Wears seat belt.   Female:   Pap-N/A       Mammo-07/21/2018, BI-RADS CATEGORY  1: Negative.        Dexa scan-Pt reports > 5 years. [place]. Ordered today, GI center.        [facility]-Colonoscopy 09/24/2015, pt reports normal.      Objective:     Vitals: BP 122/68 (BP Location: Left Arm, Patient Position: Sitting, Cuff Size: Normal)   Pulse 64   Ht 5\' 6"  (1.676 m)   Wt 142 lb 6 oz (64.6 kg)   SpO2 98%   BMI 22.98 kg/m   Body mass index is 22.98 kg/m.  Advanced Directives 09/09/2018 09/08/2017  Does Patient Have a Medical Advance Directive? Yes Yes  Type of Advance Directive Living will;Healthcare Power of Attorney Living will;Healthcare Power of [place] in Chart? Yes - validated most recent copy scanned in chart (See row information) No - copy requested    Tobacco Social History   Tobacco Use  Smoking Status Never Smoker  Smokeless Tobacco Never Used     Counseling given: Not Answered   Past Medical History:  Diagnosis Date  . GERD (gastroesophageal reflux disease)   . Laryngopharyngeal reflux (LPR) 09/01/2017   Had eval by GI and pulm  . Osteoporosis    by dexa 2009 had se of fosamax   Past Surgical History:  Procedure Laterality Date  . COLONOSCOPY    . TONSILLECTOMY     age 11   Family History  Problem Relation Age of Onset  . Hyperlipidemia Mother   . Heart disease Mother   . Hypertension  Mother   . Alzheimer's disease Mother   . Pulmonary embolism Mother   . Cancer Father        unsure of type  . Pulmonary embolism Brother   . Arthritis Brother   . Arthritis Brother   . Alzheimer's disease Maternal Grandmother   . Cancer Maternal Grandmother   . Healthy Daughter   . Healthy Son   . Lung cancer Maternal Aunt        non smoker  . Cancer Maternal Uncle   . Colon cancer Maternal Uncle   . Colon cancer Maternal Grandfather    Social History   Socioeconomic History  . Marital status: Married    Spouse name: [name]  . Number of children: 2  . Years of education: Not on file  . Highest education level: Not on file  Occupational History  . Occupation: retired - sewer, Chief Technology Officer estate book  Social Needs  . Financial resource strain: Not on file  . Food insecurity:    Worry: Not on file    Inability: Not on file  . Transportation needs:    Medical: Not on file    Non-medical: Not on file  Tobacco Use  . Smoking status: Never Smoker  . Smokeless tobacco: Never Used  Substance and Sexual Activity  . Alcohol use:  No    Alcohol/week: 0.0 standard drinks  . Drug use: No  . Sexual activity: Not Currently    Birth control/protection: Post-menopausal  Lifestyle  . Physical activity:    Days per week: Not on file    Minutes per session: Not on file  . Stress: Not on file  Relationships  . Social connections:    Talks on phone: Not on file    Gets together: Not on file    Attends religious service: Not on file    Active member of club or organization: Not on file    Attends meetings of clubs or organizations: Not on file    Relationship status: Not on file  Other Topics Concern  . 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 CALCIUM PO  Take by mouth.  . famotidine (PEPCID) 20 MG tablet One at bedtime  . VITAMIN D PO Take by mouth.  . [DISCONTINUED] pantoprazole (PROTONIX) 40 MG tablet Take 1 tablet (40 mg total) by mouth daily. Take 30-60 min before first meal of the day   No facility-administered encounter medications on file as of 09/09/2018.     Activities of Daily Living In your present state of health, do you have any difficulty performing the following activities: 09/09/2018  Hearing? N  Vision? N  Difficulty concentrating or making decisions? N  Walking or climbing stairs? N  Dressing or bathing? N  Doing errands, shopping? N  Preparing Food and eating ? N  Using the Toilet? N  In the past six months, have you accidently leaked urine? N  Do you have problems with loss of bowel control? N  Managing your Medications? N  Managing your Finances? N  Housekeeping or managing your Housekeeping? N  Some recent data might be hidden    Patient Care Team: Leamon Arnt, MD as PCP - General (Family Medicine) Ladene Artist, MD (Gastroenterology) Tanda Rockers, MD as Consulting Physician (Pulmonary Disease)    Assessment:   This is a routine wellness examination for Jenny [name].  Exercise Activities and Dietary recommendations Current Exercise Habits: Home exercise routine, Type of exercise: walking;treadmill, Frequency (Times/Week): 2, Exercise limited by: None identified   Diet (meal preparation, eat out, water intake, caffeinated beverages, dairy products, fruits and vegetables): Drinks water, milk, occasional cranberry juice.   Breakfast: sausage biscuit, bacon, eggs; cereal; coffee Lunch: subway; fast food Dinner: protein and vegetables.   Goals    . Patient Stated     Increase memory skills to prevent memory loss.     . Patient Stated     Continue memory skills.        Fall Risk Fall Risk  09/09/2018 09/08/2017 09/08/2017 09/01/2017 10/30/2014  Falls in the past year? 0 No No No No    Depression  Screen PHQ 2/9 Scores 09/09/2018 09/08/2017 09/08/2017 09/08/2017  PHQ - 2 Score 0 0 0 0  PHQ- 9 Score - - - 0     Cognitive Function MMSE - Mini Mental State Exam 09/09/2018  Orientation to time 5  Orientation to Place 5  Registration 3  Attention/ Calculation 5  Recall 3  Language- name 2 objects 2  Language- repeat 1  Language- follow 3 step  command 3  Language- read & follow direction 1  Write a sentence 1  Copy design 1  Total score 30        Immunization History  Administered Date(s) Administered  . Influenza Split 04/22/2012, 04/24/2013  . Influenza Whole 04/13/2009  . Influenza, High Dose Seasonal PF 04/13/2017, 03/28/2018  . Influenza-Unspecified 05/03/2014, 04/17/2016  . Pneumococcal Conjugate-13 10/30/2014  . Pneumococcal Polysaccharide-23 09/01/2017  . Td 07/14/2006  . Tdap 09/18/2017  . Zoster 08/13/2012  . Zoster Recombinat (Shingrix) 01/27/2018, 05/12/2018     Screening Tests Health Maintenance  Topic Date Due  . DEXA SCAN  02/28/2011  . MAMMOGRAM  07/22/2019  . COLONOSCOPY  07/15/2027  . TETANUS/TDAP  09/19/2027  . INFLUENZA VACCINE  Completed  . Hepatitis C Screening  Completed  . PNA vac Low Risk Adult  Completed        Plan:    Schedule bone scan.   Continue doing brain stimulating activities (puzzles, reading, adult coloring books, staying active) to keep memory sharp.   I have personally reviewed and noted the following in the patient's chart:   . Medical and social history . Use of alcohol, tobacco or illicit drugs  . Current medications and supplements . Functional ability and status . Nutritional status . Physical activity . Advanced directives . List of other physicians . Hospitalizations, surgeries, and ER visits in previous 12 months . Vitals . Screenings to include cognitive, depression, and falls . Referrals and appointments  In addition, I have reviewed and discussed with patient certain preventive protocols, quality  metrics, and best practice recommendations. A written personalized care plan for preventive services as well as general preventive health recommendations were provided to patient.     Gerilyn Nestle, RN  09/09/2018   PCP Notes: -Pt c/o right shoulder pain x 8 weeks. -F/U with PCP 09/10/2018

## 2018-09-09 ENCOUNTER — Encounter: Payer: PPO | Admitting: Family Medicine

## 2018-09-09 ENCOUNTER — Other Ambulatory Visit: Payer: Self-pay

## 2018-09-09 ENCOUNTER — Ambulatory Visit (INDEPENDENT_AMBULATORY_CARE_PROVIDER_SITE_OTHER): Payer: PPO

## 2018-09-09 VITALS — BP 122/68 | HR 64 | Ht 66.0 in | Wt 142.4 lb

## 2018-09-09 DIAGNOSIS — E2839 Other primary ovarian failure: Secondary | ICD-10-CM

## 2018-09-09 DIAGNOSIS — Z0000 Encounter for general adult medical examination without abnormal findings: Secondary | ICD-10-CM | POA: Diagnosis not present

## 2018-09-09 NOTE — Patient Instructions (Addendum)
Schedule bone scan.   Continue doing brain stimulating activities (puzzles, reading, adult coloring books, staying active) to keep memory sharp.   Health Maintenance, Female Adopting a healthy lifestyle and getting preventive care can go a long way to promote health and wellness. Talk with your health care provider about what schedule of regular examinations is right for you. This is a good chance for you to check in with your provider about disease prevention and staying healthy. In between checkups, there are plenty of things you can do on your own. Experts have done a lot of research about which lifestyle changes and preventive measures are most likely to keep you healthy. Ask your health care provider for more information. Weight and diet Eat a healthy diet  Be sure to include plenty of vegetables, fruits, low-fat dairy products, and lean protein.  Do not eat a lot of foods high in solid fats, added sugars, or salt.  Get regular exercise. This is one of the most important things you can do for your health. ? Most adults should exercise for at least 150 minutes each week. The exercise should increase your heart rate and make you sweat (moderate-intensity exercise). ? Most adults should also do strengthening exercises at least twice a week. This is in addition to the moderate-intensity exercise. Maintain a healthy weight  Body mass index (BMI) is a measurement that can be used to identify possible weight problems. It estimates body fat based on height and weight. Your health care provider can help determine your BMI and help you achieve or maintain a healthy weight.  For females 69 years of age and older: ? A BMI below 18.5 is considered underweight. ? A BMI of 18.5 to 24.9 is normal. ? A BMI of 25 to 29.9 is considered overweight. ? A BMI of 30 and above is considered obese. Watch levels of cholesterol and blood lipids  You should start having your blood tested for lipids and  cholesterol at 69 years of age, then have this test every 5 years.  You may need to have your cholesterol levels checked more often if: ? Your lipid or cholesterol levels are high. ? You are older than 69 years of age. ? You are at high risk for heart disease. Cancer screening Lung Cancer  Lung cancer screening is recommended for adults 69-46 years old who are at high risk for lung cancer because of a history of smoking.  A yearly low-dose CT scan of the lungs is recommended for people who: ? Currently smoke. ? Have quit within the past 15 years. ? Have at least a 30-pack-year history of smoking. A pack year is smoking an average of one pack of cigarettes a day for 1 year.  Yearly screening should continue until it has been 15 years since you quit.  Yearly screening should stop if you develop a health problem that would prevent you from having lung cancer treatment. Breast Cancer  Practice breast self-awareness. This means understanding how your breasts normally appear and feel.  It also means doing regular breast self-exams. Let your health care provider know about any changes, no matter how small.  If you are in your 20s or 30s, you should have a clinical breast exam (CBE) by a health care provider every 1-3 years as part of a regular health exam.  If you are 29 or older, have a CBE every year. Also consider having a breast X-ray (mammogram) every year.  If you have a family history  of breast cancer, talk to your health care provider about genetic screening.  If you are at high risk for breast cancer, talk to your health care provider about having an MRI and a mammogram every year.  Breast cancer gene (BRCA) assessment is recommended for women who have family members with BRCA-related cancers. BRCA-related cancers include: ? Breast. ? Ovarian. ? Tubal. ? Peritoneal cancers.  Results of the assessment will determine the need for genetic counseling and BRCA1 and BRCA2  testing. Cervical Cancer Your health care provider may recommend that you be screened regularly for cancer of the pelvic organs (ovaries, uterus, and vagina). This screening involves a pelvic examination, including checking for microscopic changes to the surface of your cervix (Pap test). You may be encouraged to have this screening done every 3 years, beginning at age 69.  For women ages 8-65, health care providers may recommend pelvic exams and Pap testing every 3 years, or they may recommend the Pap and pelvic exam, combined with testing for human papilloma virus (HPV), every 5 years. Some types of HPV increase your risk of cervical cancer. Testing for HPV may also be done on women of any age with unclear Pap test results.  Other health care providers may not recommend any screening for nonpregnant women who are considered low risk for pelvic cancer and who do not have symptoms. Ask your health care provider if a screening pelvic exam is right for you.  If you have had past treatment for cervical cancer or a condition that could lead to cancer, you need Pap tests and screening for cancer for at least 20 years after your treatment. If Pap tests have been discontinued, your risk factors (such as having a new sexual partner) need to be reassessed to determine if screening should resume. Some women have medical problems that increase the chance of getting cervical cancer. In these cases, your health care provider may recommend more frequent screening and Pap tests. Colorectal Cancer  This type of cancer can be detected and often prevented.  Routine colorectal cancer screening usually begins at 69 years of age and continues through 69 years of age.  Your health care provider may recommend screening at an earlier age if you have risk factors for colon cancer.  Your health care provider may also recommend using home test kits to check for hidden blood in the stool.  A small camera at the end of a  tube can be used to examine your colon directly (sigmoidoscopy or colonoscopy). This is done to check for the earliest forms of colorectal cancer.  Routine screening usually begins at age 69.  Direct examination of the colon should be repeated every 5-10 years through 69 years of age. However, you may need to be screened more often if early forms of precancerous polyps or small growths are found. Skin Cancer  Check your skin from head to toe regularly.  Tell your health care provider about any new moles or changes in moles, especially if there is a change in a mole's shape or color.  Also tell your health care provider if you have a mole that is larger than the size of a pencil eraser.  Always use sunscreen. Apply sunscreen liberally and repeatedly throughout the day.  Protect yourself by wearing long sleeves, pants, a wide-brimmed hat, and sunglasses whenever you are outside. Heart disease, diabetes, and high blood pressure  High blood pressure causes heart disease and increases the risk of stroke. High blood pressure is more  likely to develop in: ? People who have blood pressure in the high end of the normal range (130-139/85-89 mm Hg). ? People who are overweight or obese. ? People who are African American.  If you are 79-39 years of age, have your blood pressure checked every 3-5 years. If you are 38 years of age or older, have your blood pressure checked every year. You should have your blood pressure measured twice-once when you are at a hospital or clinic, and once when you are not at a hospital or clinic. Record the average of the two measurements. To check your blood pressure when you are not at a hospital or clinic, you can use: ? An automated blood pressure machine at a pharmacy. ? A home blood pressure monitor.  If you are between 81 years and 5 years old, ask your health care provider if you should take aspirin to prevent strokes.  Have regular diabetes screenings. This  involves taking a blood sample to check your fasting blood sugar level. ? If you are at a normal weight and have a low risk for diabetes, have this test once every three years after 69 years of age. ? If you are overweight and have a high risk for diabetes, consider being tested at a younger age or more often. Preventing infection Hepatitis B  If you have a higher risk for hepatitis B, you should be screened for this virus. You are considered at high risk for hepatitis B if: ? You were born in a country where hepatitis B is common. Ask your health care provider which countries are considered high risk. ? Your parents were born in a high-risk country, and you have not been immunized against hepatitis B (hepatitis B vaccine). ? You have HIV or AIDS. ? You use needles to inject street drugs. ? You live with someone who has hepatitis B. ? You have had sex with someone who has hepatitis B. ? You get hemodialysis treatment. ? You take certain medicines for conditions, including cancer, organ transplantation, and autoimmune conditions. Hepatitis C  Blood testing is recommended for: ? Everyone born from [birth year] through 1966. ? Anyone with known risk factors for hepatitis C. Sexually transmitted infections (STIs)  You should be screened for sexually transmitted infections (STIs) including gonorrhea and chlamydia if: ? You are sexually active and are younger than 69 years of age. ? You are older than 69 years of age and your health care provider tells you that you are at risk for this type of infection. ? Your sexual activity has changed since you were last screened and you are at an increased risk for chlamydia or gonorrhea. Ask your health care provider if you are at risk.  If you do not have HIV, but are at risk, it may be recommended that you take a prescription medicine daily to prevent HIV infection. This is called pre-exposure prophylaxis (PrEP). You are considered at risk if: ? You are  sexually active and do not regularly use condoms or know the HIV status of your partner(s). ? You take drugs by injection. ? You are sexually active with a partner who has HIV. Talk with your health care provider about whether you are at high risk of being infected with HIV. If you choose to begin PrEP, you should first be tested for HIV. You should then be tested every 3 months for as long as you are taking PrEP. Pregnancy  If you are premenopausal and you may become pregnant, ask  your health care provider about preconception counseling.  If you may become pregnant, take 400 to 800 micrograms (mcg) of folic acid every day.  If you want to prevent pregnancy, talk to your health care provider about birth control (contraception). Osteoporosis and menopause  Osteoporosis is a disease in which the bones lose minerals and strength with aging. This can result in serious bone fractures. Your risk for osteoporosis can be identified using a bone density scan.  If you are 79 years of age or older, or if you are at risk for osteoporosis and fractures, ask your health care provider if you should be screened.  Ask your health care provider whether you should take a calcium or vitamin D supplement to lower your risk for osteoporosis.  Menopause may have certain physical symptoms and risks.  Hormone replacement therapy may reduce some of these symptoms and risks. Talk to your health care provider about whether hormone replacement therapy is right for you. Follow these instructions at home:  Schedule regular health, dental, and eye exams.  Stay current with your immunizations.  Do not use any tobacco products including cigarettes, chewing tobacco, or electronic cigarettes.  If you are pregnant, do not drink alcohol.  If you are breastfeeding, limit how much and how often you drink alcohol.  Limit alcohol intake to no more than 1 drink per day for nonpregnant women. One drink equals 12 ounces of  beer, 5 ounces of wine, or 1 ounces of hard liquor.  Do not use street drugs.  Do not share needles.  Ask your health care provider for help if you need support or information about quitting drugs.  Tell your health care provider if you often feel depressed.  Tell your health care provider if you have ever been abused or do not feel safe at home. This information is not intended to replace advice given to you by your health care provider. Make sure you discuss any questions you have with your health care provider. Document Released: 01/13/2011 Document Revised: 12/06/2015 Document Reviewed: 04/03/2015 Elsevier Interactive Patient Education  2019 [employer].

## 2018-09-09 NOTE — Progress Notes (Signed)
RN Hodge note reviewed in PCP absence.  Leeanne Rio, PA-C

## 2018-09-10 ENCOUNTER — Ambulatory Visit: Payer: PPO | Admitting: Family Medicine

## 2018-09-14 NOTE — Progress Notes (Signed)
I have reviewed the documentation from the recent AWV done by [name] Broome; I agree with the documentation and will follow up on any recommendations or abnormal findings as suggested.  

## 2018-09-20 ENCOUNTER — Other Ambulatory Visit: Payer: PPO

## 2018-09-21 ENCOUNTER — Other Ambulatory Visit: Payer: Self-pay

## 2018-09-21 ENCOUNTER — Telehealth: Payer: Self-pay | Admitting: Family Medicine

## 2018-09-21 ENCOUNTER — Ambulatory Visit (INDEPENDENT_AMBULATORY_CARE_PROVIDER_SITE_OTHER): Payer: PPO | Admitting: Family Medicine

## 2018-09-21 ENCOUNTER — Encounter: Payer: Self-pay | Admitting: Family Medicine

## 2018-09-21 VITALS — BP 102/68 | HR 90 | Temp 98.4°F | Resp 16 | Ht 66.0 in | Wt 143.2 lb

## 2018-09-21 DIAGNOSIS — M25511 Pain in right shoulder: Secondary | ICD-10-CM

## 2018-09-21 DIAGNOSIS — R011 Cardiac murmur, unspecified: Secondary | ICD-10-CM

## 2018-09-21 DIAGNOSIS — G250 Essential tremor: Secondary | ICD-10-CM

## 2018-09-21 DIAGNOSIS — K219 Gastro-esophageal reflux disease without esophagitis: Secondary | ICD-10-CM

## 2018-09-21 DIAGNOSIS — Z0000 Encounter for general adult medical examination without abnormal findings: Secondary | ICD-10-CM | POA: Diagnosis not present

## 2018-09-21 DIAGNOSIS — M810 Age-related osteoporosis without current pathological fracture: Secondary | ICD-10-CM | POA: Diagnosis not present

## 2018-09-21 HISTORY — DX: Essential tremor: G25.0

## 2018-09-21 LAB — CBC WITH DIFFERENTIAL/PLATELET
Basophils Absolute: 0.1 10*3/uL (ref 0.0–0.1)
Basophils Relative: 1.3 % (ref 0.0–3.0)
Eosinophils Absolute: 0.1 10*3/uL (ref 0.0–0.7)
Eosinophils Relative: 1.8 % (ref 0.0–5.0)
HCT: 39.4 % (ref 36.0–46.0)
HEMOGLOBIN: 13.2 g/dL (ref 12.0–15.0)
Lymphocytes Relative: 29.6 % (ref 12.0–46.0)
Lymphs Abs: 1.7 10*3/uL (ref 0.7–4.0)
MCHC: 33.5 g/dL (ref 30.0–36.0)
MCV: 88.1 fl (ref 78.0–100.0)
Monocytes Absolute: 0.4 10*3/uL (ref 0.1–1.0)
Monocytes Relative: 7.5 % (ref 3.0–12.0)
Neutro Abs: 3.4 10*3/uL (ref 1.4–7.7)
Neutrophils Relative %: 59.8 % (ref 43.0–77.0)
Platelets: 210 10*3/uL (ref 150.0–400.0)
RBC: 4.48 Mil/uL (ref 3.87–5.11)
RDW: 13.4 % (ref 11.5–15.5)
WBC: 5.7 10*3/uL (ref 4.0–10.5)

## 2018-09-21 LAB — COMPREHENSIVE METABOLIC PANEL
ALBUMIN: 4.8 g/dL (ref 3.5–5.2)
ALT: 11 U/L (ref 0–35)
AST: 17 U/L (ref 0–37)
Alkaline Phosphatase: 100 U/L (ref 39–117)
BILIRUBIN TOTAL: 0.4 mg/dL (ref 0.2–1.2)
BUN: 21 mg/dL (ref 6–23)
CO2: 31 mEq/L (ref 19–32)
Calcium: 9.7 mg/dL (ref 8.4–10.5)
Chloride: 103 mEq/L (ref 96–112)
Creatinine, Ser: 0.76 mg/dL (ref 0.40–1.20)
GFR: 75.45 mL/min (ref >60.00)
Glucose, Bld: 82 mg/dL (ref 70–99)
Potassium: 4.1 mEq/L (ref 3.5–5.1)
Sodium: 141 mEq/L (ref 135–145)
Total Protein: 7.2 g/dL (ref 6.0–8.3)

## 2018-09-21 LAB — LIPID PANEL
Cholesterol: 206 mg/dL — ABNORMAL HIGH (ref 0–200)
HDL: 62.2 mg/dL (ref >39.00)
NONHDL: 143.91
Total CHOL/HDL Ratio: 3
Triglycerides: 236 mg/dL — ABNORMAL HIGH (ref 0.0–149.0)
VLDL: 47.2 mg/dL — ABNORMAL HIGH (ref 0.0–40.0)

## 2018-09-21 LAB — LDL CHOLESTEROL, DIRECT: Direct LDL: 117 mg/dL

## 2018-09-21 LAB — VITAMIN D 25 HYDROXY (VIT D DEFICIENCY, FRACTURES): VITD: 60.66 ng/mL (ref 30.00–100.00)

## 2018-09-21 LAB — TSH: TSH: 3.3 u[IU]/mL (ref 0.35–4.50)

## 2018-09-21 NOTE — Telephone Encounter (Signed)
Please change the order.    Copied from [place] [phone]. Topic: General - Inquiry >> Sep 20, 2018 11:54 AM Ahmed Prima L wrote: Reason for CRM: patient was unable to have her bone density because she was late when she arrived. She thought they said [facility] but it was [place] so they would not see her. She thought they were rude to her and so she rather not go back there. She would like to know would her insurance cover her to go somewhere else?

## 2018-09-21 NOTE — Progress Notes (Signed)
Subjective  Chief Complaint  Patient presents with  . Annual Exam    She has not fasting  . Shoulder Pain    Right side, limited ROM, worse at night.. Takes IBU PM    HPI: ANAE HAMS is a 68 y.o. female who presents to [place] at [facility] today for a Female Wellness Visit. She also has the concerns and/or needs as listed above in the chief complaint. These will be addressed in addition to the Health Maintenance Visit.   Wellness Visit: annual visit with health maintenance review and exam without Pap   Health maintenance: Reviewed recent annual wellness visit.  Continues healthy lifestyle.  Feels well.  Had mammogram yesterday and it was normal.  Needs to reschedule bone density.  All immunizations are up-to-date.  She is not fasting today for labs. Chronic disease f/u and/or acute problem visit: (deemed necessary to be done in addition to the wellness visit):  LPR/GERD: Doing very well.  Rarely needs antacid or PPI.  History of osteoporosis failed Fosamax in the distant past due to side effects.  As above noted, needs bone density scan.  If persistent osteoporosis or worsening, would recommend Prolia.  Continue calcium and vitamin D.  Mildly symptomatic essential tremor, worse when anxious  Complaints of right shoulder pain that started about 2 months ago without injury or overuse.  Complains of limited range of motion.  Pain had been moderate To awake at night.  Improves with Aleve.  Pain is somewhat improved.  Still has trouble with certain activities.  No neck pain.  No elbow or hand pain.  No paresthesias.  Review of systems negative for lightheadedness, orthostatic hypotension, fatigue with exertion, chest pain  Assessment  1. Annual physical exam   2. Laryngopharyngeal reflux (LPR)   3. Age-related osteoporosis without current pathological fracture   4. Gastroesophageal reflux disease without esophagitis   5. Essential tremor   6. Right  shoulder pain, unspecified chronicity   7. Systolic murmur      Plan  Female Wellness Visit:  Age appropriate Health Maintenance and Prevention measures were discussed with patient. Included topics are cancer screening recommendations, ways to keep healthy (see AVS) including dietary and exercise recommendations, regular eye and dental care, use of seat belts, and avoidance of moderate alcohol use and tobacco use.   BMI: discussed patient's BMI and encouraged positive lifestyle modifications to help get to or maintain a target BMI.  HM needs and immunizations were addressed and ordered. See below for orders. See HM and immunization section for updates.  Routine labs and screening tests ordered including cmp, cbc and lipids where appropriate.  Discussed recommendations regarding Vit D and calcium supplementation (see AVS)  Osteoporosis: Recheck bone density.  Start Prolia if indicated.  Patient understands and agrees with care plan.  Chronic disease management visit and/or acute problem visit:  Shoulder pain: Worrisome for frozen shoulder.  Possible remote rotator cuff tear.  Gave patient options of care refer to sports medicine for further evaluation, treatment options and imaging studies.  GERD and LPR well controlled with as needed medications  Asymptomatic essential tremor: Monitor  Discussed systolic murmur.  Asymptomatic.  Will monitor.  Consider echocardiogram.  Most consistent with mild aortic stenosis.  Follow up: No follow-ups on file.  Orders Placed This Encounter  Procedures  . CBC with Differential/Platelet  . Comprehensive metabolic panel  . Lipid panel  . TSH  . VITAMIN D 25 Hydroxy (Vit-D Deficiency, Fractures)  . Ambulatory  referral to Sports Medicine   No orders of the defined types were placed in this encounter.     Lifestyle: Body mass index is 23.11 kg/m. Wt Readings from Last 3 Encounters:  09/21/18 143 lb 3.2 oz (65 kg)  09/09/18 142 lb 6 oz (64.6  kg)  06/04/18 141 lb 6.4 oz (64.1 kg)     Patient Active Problem List   Diagnosis Date Noted  . Essential tremor 09/21/2018  . Laryngopharyngeal reflux (LPR) 09/01/2017    Had eval by GI and pulm   . Family history of Alzheimer's disease 10/30/2014    disc prevnetion cv risk reduction avoid head trauma can ask  neuro  also    . GERD (gastroesophageal reflux disease) 07/25/2013    EGD and colonoscopy Dr. Modena Jansky in [place] 2019: normal results   . Osteoporosis 06/17/2011    By dexa in 2009  -2.8 ls spine needs fu.  Apparently had se of fosmax in the past.    Health Maintenance  Topic Date Due  . DEXA SCAN  02/28/2011  . MAMMOGRAM  07/22/2019  . COLONOSCOPY  07/15/2027  . TETANUS/TDAP  09/19/2027  . INFLUENZA VACCINE  Completed  . Hepatitis C Screening  Completed  . PNA vac Low Risk Adult  Completed   Immunization History  Administered Date(s) Administered  . Influenza Split 04/22/2012, 04/24/2013  . Influenza Whole 04/13/2009  . Influenza, High Dose Seasonal PF 04/13/2017, 03/28/2018  . Influenza-Unspecified 05/03/2014, 04/17/2016  . Pneumococcal Conjugate-13 10/30/2014  . Pneumococcal Polysaccharide-23 09/01/2017  . Td 07/14/2006  . Tdap 09/18/2017  . Zoster 08/13/2012  . Zoster Recombinat (Shingrix) 01/27/2018, 05/12/2018   We updated and reviewed the patient's past history in detail and it is documented below. Allergies: Patient is allergic to hydrocodeine [dihydrocodeine] and fosamax [alendronate sodium]. Past Medical History Patient  has a past medical history of Essential tremor (09/21/2018), GERD (gastroesophageal reflux disease), Laryngopharyngeal reflux (LPR) (09/01/2017), and Osteoporosis. Past Surgical History Patient  has a past surgical history that includes Tonsillectomy and Colonoscopy. Family History: Patient family history includes Alzheimer's disease in her maternal grandmother and mother; Arthritis in her brother and brother; Cancer in her  father, maternal grandmother, and maternal uncle; Colon cancer in her maternal grandfather and maternal uncle; Healthy in her daughter and son; Heart disease in her mother; Hyperlipidemia in her mother; Hypertension in her mother; Lung cancer in her maternal aunt; Pulmonary embolism in her brother and mother. Social History:  Patient  reports that she has never smoked. She has never used smokeless tobacco. She reports that she does not drink alcohol or use drugs.  Review of Systems: Constitutional: negative for fever or malaise Ophthalmic: negative for photophobia, double vision or loss of vision Cardiovascular: negative for chest pain, dyspnea on exertion, or new LE swelling Respiratory: negative for SOB or persistent cough Gastrointestinal: negative for abdominal pain, change in bowel habits or melena Genitourinary: negative for dysuria or gross hematuria, no abnormal uterine bleeding or disharge Musculoskeletal: negative for new gait disturbance or muscular weakness Integumentary: negative for new or persistent rashes, no breast lumps Neurological: negative for TIA or stroke symptoms Psychiatric: negative for SI or delusions Allergic/Immunologic: negative for hives  Patient Care Team    Relationship Specialty Notifications Start End  Leamon Arnt, MD PCP - General Family Medicine  09/01/17   Ladene Artist, MD  Gastroenterology  06/16/11    Comment: ? vs Bronwen Betters, MD Consulting Physician Pulmonary Disease  09/01/17  Objective  Vitals: BP 102/68   Pulse 90   Temp 98.4 F (36.9 C) (Oral)   Resp 16   Ht 5\' 6"  (1.676 m)   Wt 143 lb 3.2 oz (65 kg)   SpO2 98%   BMI 23.11 kg/m  General:  Well developed, well nourished, no acute distress  Psych:  Alert and orientedx3,normal mood and affect HEENT:  Normocephalic, atraumatic, non-icteric sclera, PERRL, oropharynx is clear without mass or exudate, supple neck without adenopathy, mass or thyromegaly Cardiovascular:   Normal S1, S2, RRR without gallop, rub or murmur, nondisplaced PMI Respiratory:  Good breath sounds bilaterally, CTAB with normal respiratory effort Gastrointestinal: normal bowel sounds, soft, non-tender, no noted masses. No HSM MSK: no deformities, contusions. Joints are without erythema or swelling. Spine and CVA region are nontender Right shoulder exam: Positive anterior tenderness, decreased active and passive range of motion.  Positive pain testing.  Negative drop test. Skin:  Warm, no rashes or suspicious lesions noted Neurologic:    Mental status is normal. CN 2-11 are normal. Gross motor and sensory exams are normal. Normal gait. No tremor Breast Exam: No mass, skin retraction or nipple discharge is appreciated in either breast. No axillary adenopathy. Fibrocystic changes are not noted    Commons side effects, risks, benefits, and alternatives for medications and treatment plan prescribed today were discussed, and the patient expressed understanding of the given instructions. Patient is instructed to call or message via MyChart if he/she has any questions or concerns regarding our treatment plan. No barriers to understanding were identified. We discussed Red Flag symptoms and signs in detail. Patient expressed understanding regarding what to do in case of urgent or emergency type symptoms.   Medication list was reconciled, printed and provided to the patient in AVS. Patient instructions and summary information was reviewed with the patient as documented in the AVS. This note was prepared with assistance of Dragon voice recognition software. Occasional wrong-word or sound-a-like substitutions may have occurred due to the inherent limitations of voice recognition software

## 2018-09-21 NOTE — Patient Instructions (Signed)
Please return in 12 months for your annual complete physical; please come fasting.  We will call you with information regarding your referral appointment. Sports Medicine for your shoulder.  If you do not hear from [place] within the next 2 weeks, please let me know. It can take 1-2 weeks to get appointments set up with the specialists.   Please call and reschedule your bone density test so can see if we need to start the prolia injections for osteoporosis.   If you have any questions or concerns, please don't hesitate to send me a message via MyChart or call the office at [phone]. Thank you for visiting with [place] today! It's our pleasure caring for you.  Please do these things to maintain good health!   Exercise at least 30-45 minutes a day,  4-5 days a week.   Eat a low-fat diet with lots of fruits and vegetables, up to 7-9 servings per day.  Drink plenty of water daily. Try to drink 8 8oz glasses per day.  Seatbelts can save your life. Always wear your seatbelt.  Place Smoke Detectors on every level of your home and check batteries every year.  Schedule an appointment with an eye doctor for an eye exam every 1-2 years  Safe sex - use condoms to protect yourself from STDs if you could be exposed to these types of infections. Use birth control if you do not want to become pregnant and are sexually active.  Avoid heavy alcohol use. If you drink, keep it to less than 2 drinks/day and not every day.  [place].  Choose someone you trust that could speak for you if you became unable to speak for yourself.  Depression is common in our stressful world.If you're feeling down or losing interest in things you normally enjoy, please come in for a visit.  If anyone is threatening or hurting you, please get help. Physical or Emotional Violence is never OK.    Osteoporosis  Osteoporosis is thinning and loss of density in your bones. Osteoporosis makes bones more brittle and  fragile and more likely to break (fracture). Over time, osteoporosis can cause your bones to become so weak that they fracture after a minor fall. Bones in the hip, wrist, and spine are most likely to fracture due to osteoporosis. What are the causes? The exact cause of this condition is not known. What increases the risk? You may be at greater risk for osteoporosis if you:  Have a family history of the condition.  Have poor nutrition.  Use steroid medicines, such as prednisone.  Are female.  Are age 73 or older.  Smoke or have a history of smoking.  Are not physically active (are sedentary).  Are white (Caucasian) or of Asian descent.  Have a small body frame.  Take certain medicines, such as antiseizure medicines. What are the signs or symptoms? A fracture might be the first sign of osteoporosis, especially if the fracture results from a fall or injury that usually would not cause a bone to break. Other signs and symptoms include:  Pain in the neck or low back.  Stooped posture.  Loss of height. How is this diagnosed? This condition may be diagnosed based on:  Your medical history.  A physical exam.  A bone mineral density test, also called a DXA or DEXA test (dual-energy X-ray absorptiometry test). This test uses X-rays to measure the amount of minerals in your bones. How is this treated? The goal  of treatment is to strengthen your bones and lower your risk for a fracture. Treatment may involve:  Making lifestyle changes, such as: ? Including foods with more calcium and vitamin D in your diet. ? Doing weight-bearing and muscle-strengthening exercises. ? Stopping tobacco use. ? Limiting alcohol intake.  Taking medicine to slow the process of bone loss or to increase bone density.  Taking daily supplements of calcium and vitamin D.  Taking hormone replacement medicines, such as estrogen for women and testosterone for men.  Monitoring your levels of calcium and  vitamin D. Follow these instructions at home:  Activity  Exercise as told by your health care provider. Ask your health care provider what exercises and activities are safe for you. You should do: ? Exercises that make you work against gravity (weight-bearing exercises), such as tai chi, yoga, or walking. ? Exercises to strengthen muscles, such as lifting weights. Lifestyle  Limit alcohol intake to no more than 1 drink a day for nonpregnant women and 2 drinks a day for men. One drink equals 12 oz of beer, 5 oz of wine, or 1 oz of hard liquor.  Do not use any products that contain nicotine or tobacco, such as cigarettes and e-cigarettes. If you need help quitting, ask your health care provider. Preventing falls  Use devices to help you move around (mobility aids) as needed, such as canes, walkers, scooters, or crutches.  Keep rooms well-lit and clutter-free.  Remove tripping hazards from walkways, including cords and throw rugs.  Install grab bars in bathrooms and safety rails on stairs.  Use rubber mats in the bathroom and other areas that are often wet or slippery.  Wear closed-toe shoes that fit well and support your feet. Wear shoes that have rubber soles or low heels.  Review your medicines with your health care provider. Some medicines can cause dizziness or changes in blood pressure, which can increase your risk of falling. General instructions  Include calcium and vitamin D in your diet. Calcium is important for bone health, and vitamin D helps your body to absorb calcium. Good sources of calcium and vitamin D include: ? Certain fatty fish, such as salmon and tuna. ? Products that have calcium and vitamin D added to them (fortified products), such as fortified cereals. ? Egg yolks. ? Cheese. ? Liver.  Take over-the-counter and prescription medicines only as told by your health care provider.  Keep all follow-up visits as told by your health care provider. This is  important. Contact a health care provider if:  You have never been screened for osteoporosis and you are: ? A woman who is age 69 or older. ? A man who is age 67 or older. Get help right away if:  You fall or injure yourself. Summary  Osteoporosis is thinning and loss of density in your bones. This makes bones more brittle and fragile and more likely to break (fracture),even with minor falls.  The goal of treatment is to strengthen your bones and reduce your risk for a fracture.  Include calcium and vitamin D in your diet. Calcium is important for bone health, and vitamin D helps your body to absorb calcium.  Talk with your health care provider about screening for osteoporosis if you are a woman who is age 72 or older, or a man who is age 16 or older. This information is not intended to replace advice given to you by your health care provider. Make sure you discuss any questions you have with  your health care provider. Document Released: 04/09/2005 Document Revised: 04/24/2017 Document Reviewed: 04/24/2017 Elsevier Interactive Patient Education  2019 [employer].

## 2018-09-22 ENCOUNTER — Encounter: Payer: Self-pay | Admitting: Family Medicine

## 2018-09-22 NOTE — Progress Notes (Signed)
I have reviewed results. Normal. Patient notified by letter. Please see letter for details. The 10-year ASCVD risk score Mikey Bussing DC Brooke Bonito., et al., 2013) is: 5.5%   Values used to calculate the score:     Age: 68 years     Sex: Female     Is Non-Hispanic African American: No     Diabetic: No     Tobacco smoker: No     Systolic Blood Pressure: 015 mmHg     Is BP treated: No     HDL Cholesterol: 62.2 mg/dL     Total Cholesterol: 206 mg/dL

## 2018-09-22 NOTE — Progress Notes (Signed)
Lab results mailed to patient in letter. Normal results. No action / follow up needed on these results.

## 2018-09-30 ENCOUNTER — Other Ambulatory Visit (HOSPITAL_BASED_OUTPATIENT_CLINIC_OR_DEPARTMENT_OTHER): Payer: PPO

## 2018-09-30 ENCOUNTER — Encounter: Payer: Self-pay | Admitting: Sports Medicine

## 2018-09-30 ENCOUNTER — Other Ambulatory Visit: Payer: Self-pay

## 2018-09-30 ENCOUNTER — Ambulatory Visit (INDEPENDENT_AMBULATORY_CARE_PROVIDER_SITE_OTHER): Payer: PPO | Admitting: Sports Medicine

## 2018-09-30 VITALS — BP 112/80 | HR 73 | Ht 66.0 in | Wt 143.2 lb

## 2018-09-30 DIAGNOSIS — M75101 Unspecified rotator cuff tear or rupture of right shoulder, not specified as traumatic: Secondary | ICD-10-CM | POA: Diagnosis not present

## 2018-09-30 DIAGNOSIS — M25511 Pain in right shoulder: Secondary | ICD-10-CM

## 2018-09-30 DIAGNOSIS — G2589 Other specified extrapyramidal and movement disorders: Secondary | ICD-10-CM | POA: Diagnosis not present

## 2018-09-30 NOTE — Patient Instructions (Addendum)

## 2018-09-30 NOTE — Progress Notes (Signed)
Jenny [name]. Jenny [name], [facility]  Jenny [name] - 68 y.o. female MRN [id number]  Date of birth: 06/28/1951  Visit Date: September 30, 2018  PCP: Leamon Arnt, MD   Referred by: Leamon Arnt, MD  SUBJECTIVE:  Chief Complaint  Patient presents with  . Right Arm - Initial Assessment    Referred by Dr. Jonni Sanger. Sx x 8 weeks. No imaging or neck or shoulder. Has tried IBU pm prn. MOI unknown.     HPI: 8 weeks of persistent right shoulder pain that is keeping her awake at night while laying directly on the side.  The pain did initially radiate from close to the elbow towards the neck but is now localized over the lateral deltoid.  Pain is worse with lifting heavy objects and with reaching back behind her such as to put on a coat.She describes the pain is moderate to severe and is aching sharp occasionally.  It has been slightly improving but is kind of hit a plateau.  She denies any numbness or tingling radiating past the elbow.  REVIEW OF SYSTEMS: Night time disturbances: Reports, Both sleep onset and sleep maintenance issues. Fevers, chills and night sweats: Denies Unexplained weight loss: Denies Personal history of cancer: Denies Changes in bowel or bladder habits: Denies Recent unreported falls: Denies New or worsening dyspnea or wheezing: Denies Headaches and dizziness: Reports, Chronic headaches that improved with caffeine. Numbness, tingling and weakness in the extremities: Denies Dizziness or presyncopal episodes: Denies Lower extremity edema: Denies  HISTORY:  Prior history reviewed and updated per electronic medical record.  Patient Active Problem List   Diagnosis Date Noted  . Essential tremor 09/21/2018  . Laryngopharyngeal reflux (LPR) 09/01/2017    Had eval by GI and pulm   . Family history of Alzheimer's disease 10/30/2014    disc prevnetion cv risk reduction avoid head trauma can ask  neuro  also     . GERD (gastroesophageal reflux disease) 07/25/2013    EGD and colonoscopy Dr. Modena Jansky in [place] 2019: normal results   . Osteoporosis 06/17/2011    By dexa in 2009  -2.8 ls spine needs fu.  Apparently had se of fosmax in the past.    Social History   Occupational History  . 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 Smoking status: Never Smoker  . Smokeless tobacco: Never Used  Substance and Sexual Activity  . Alcohol use: No    Alcohol/week: 0.0 standard drinks  . Drug use: No  . 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 Essential tremor 09/21/2018  . GERD (gastroesophageal reflux disease)   . Laryngopharyngeal reflux (LPR) 09/01/2017   Had eval by GI and pulm  . Osteoporosis    by dexa 2009 had se of fosamax   Past Surgical History:  Procedure Laterality Date  . COLONOSCOPY    . TONSILLECTOMY     age 35   family history includes Alzheimer's disease in her maternal grandmother and mother; Arthritis in her brother and brother; Cancer in her father, maternal grandmother, and maternal uncle; Colon cancer  in her maternal grandfather and maternal uncle; Healthy in her daughter and son; Heart disease in her mother; Hyperlipidemia in her mother; Hypertension in her mother; Lung cancer in her maternal aunt; Pulmonary embolism in her brother and mother.  OBJECTIVE:  VS:  HT:5\' 6"  (167.6 cm)   WT:143 lb 3.2 oz (65 kg)  BMI:23.12    BP:112/80  HR:73bpm  TEMP: ( )  RESP:96 %   PHYSICAL EXAM: CONSTITUTIONAL: Well-developed, Well-nourished and In no acute distress EYES: Pupils are equal., EOM intact without nystagmus. and No scleral icterus. Psychiatric: Alert &  appropriately interactive. and Not depressed or anxious appearing. EXTREMITY EXAM: Warm and well perfused  Right shoulder: Overall well aligned without significant deformity.  She has slightly limited overhead range of motion by 10 degrees on the right compared to the left.  She has negative drop arm test.  Good internal rotation to L3 bilaterally.  Internal rotation and external rotation strength is 5/5 with poor scapular mobility.  She has pain and slight weakness at 4 out of 5 with empty can testing, speeds testing and O'Brien's testing.  Positive Hawkins and positive Neer's testing.  No significant crepitation with axial load and circumduction.  No focal bony tenderness.     ASSESSMENT:  1. Right shoulder pain, unspecified chronicity   2. Rotator cuff syndrome of right shoulder   3. Scapular dyskinesis     PROCEDURES:  PROCEDURE NOTE: THERAPEUTIC EXERCISES (39767)   Discussed the foundation of treatment for this condition is physical therapy and/or daily (5-6 days/week) therapeutic exercises, focusing on core strengthening, coordination, neuromuscular control/reeducation. 15 minutes spent for Therapeutic exercises as below and as referenced in the AVS. This included exercises focusing on stretching, strengthening, with significant focus on eccentric aspects.  Proper technique shown and discussed handout in great detail with ATC. All questions were discussed and answered.   Long term goals include an improvement in range of motion, strength, endurance as well as avoiding reinjury. Frequency of visits is one time as determined during today's office visit. Frequency of exercises to be performed is as per handout.  EXERCISES REVIEWED:   Intrinsic Rotator Cuff Exercises Scapular Stabilization   PROCEDURE NOTE: [place] Guided: Injection: Rightshoulder  DESCRIPTION OF PROCEDURE:  The patient's clinical condition is marked by substantial pain and/or significant functional disability. Other  conservative therapy has not provided relief, is contraindicated, or not appropriate. There is a reasonable likelihood that injection will significantly improve the patient's pain and/or functional impairment.  After discussing the risks, benefits and expected outcomes of the injection and all questions were reviewed and answered, the patient wished to undergo the above named procedure. Verbal consent was obtained. The skin was then prepped in sterile fashion and the target structure was injected as below:  Single injection performed as below:  PREP: Alcohol and Ethel Chloride  APPROACH: posterior, single injection, 22g 1.5 in.  INJECTATE: 2 cc 0.5% Marcaine and 2 cc 40mg /mL DepoMedrol  DRESSING: Band-Aid  Post procedural instructions including recommending icing and warning signs for infection were reviewed.  This procedure was well tolerated and there were no complications.       PLAN:  Pertinent additional documentation may be included in corresponding procedure notes, imaging studies, problem based documentation and patient instructions.  No problem-specific Assessment & Plan notes found for this encounter.   Fairly classic rotator cuff syndrome.  Should do well with injection therapy and therapeutic exercises.  If any lack of improvement can consider further diagnostic evaluation.  Home Therapeutic  Exercises: New program prescribed today per procedure note.  RICE (Rest, ICE, Compression, Elevation) principles reviewed with the patient.  Activity modifications and the importance of avoiding exacerbating activities (limiting pain to no more than a 4 / 10 during or following activity) recommended and discussed.   Discussed red flag symptoms that warrant earlier emergent evaluation and patient voices understanding.    No orders of the defined types were placed in this encounter.  Lab Orders  No laboratory test(s) ordered today   Imaging Orders  No imaging studies ordered today    Referral Orders  No referral(s) requested today     Return in about 6 weeks (around 11/11/2018).          Gerda Diss, [facility] Physician

## 2018-11-11 ENCOUNTER — Ambulatory Visit: Payer: PPO | Admitting: Sports Medicine

## 2018-11-16 ENCOUNTER — Ambulatory Visit: Payer: PPO | Admitting: Sports Medicine

## 2018-11-17 ENCOUNTER — Encounter: Payer: Self-pay | Admitting: Family Medicine

## 2018-11-17 ENCOUNTER — Ambulatory Visit (INDEPENDENT_AMBULATORY_CARE_PROVIDER_SITE_OTHER): Payer: PPO | Admitting: Family Medicine

## 2018-11-17 DIAGNOSIS — M75101 Unspecified rotator cuff tear or rupture of right shoulder, not specified as traumatic: Secondary | ICD-10-CM | POA: Insufficient documentation

## 2018-11-17 NOTE — Assessment & Plan Note (Signed)
Patient appeared to seem to respond well to the injection previously.  We discussed icing regimen and home exercises.  We discussed which activities to do which wants to avoid.  Patient is to increase activity slowly over the course the next several weeks.  Follow-up again as needed

## 2018-11-17 NOTE — Progress Notes (Signed)
[facility] [address], [place] [address] Phone: [phone] Subjective:   Virtual Visit via Video Note  I connected with [name] on 11/17/18 at 12:45 PM EDT by a video enabled telemedicine application and verified that I am speaking with the correct person using two identifiers.  Location: Patient: Patient was in home residence Provider: I was in office setting   I discussed the limitations of evaluation and management by telemedicine and the availability of in person appointments. The patient expressed understanding and agreed to proceed.      I discussed the assessment and treatment plan with the patient. The patient was provided an opportunity to ask questions and all were answered. The patient agreed with the plan and demonstrated an understanding of the instructions.   The patient was advised to call back or seek an in-person evaluation if the symptoms worsen or if the condition fails to improve as anticipated.  I provided 15 minutes of non-face-to-face time during this encounter.   Lyndal Pulley, DO  More detailed note below  CC: Right shoulder pain follow-up  MOQ:[id number]  LYNIAH FUJITA is a 68 y.o. female coming in with complaint of right shoulder pain.  Patient was seen previously for persistent right shoulder pain by another provider.  Patient was diagnosed with rotator cuff syndrome as well as scapular dyskinesis.  Patient was given an injection in the shoulder at that time as well.  It was nearly 2 months ago.  Patient states she is doing approximately 70% better.  Patient states only when she moves it to higher transfer which brought her back to she has some discomfort.  Denies any decrease in range of motion as well as the pain when stopping her.  Denies any significant weakness either.  Doing the exercises occasionally.  Not taking any significant medications at this time.       Past Medical History:   Diagnosis Date  . Essential tremor 09/21/2018  . GERD (gastroesophageal reflux disease)   . Laryngopharyngeal reflux (LPR) 09/01/2017   Had eval by GI and pulm  . Osteoporosis    by dexa 2009 had se of fosamax   Past Surgical History:  Procedure Laterality Date  . COLONOSCOPY    . TONSILLECTOMY     age 33   Social History   Socioeconomic History  . Marital status: Married    Spouse name: [name]  . Number of children: 2  . Years of education: Not on file  . Highest education level: Not on file  Occupational History  . Occupation: retired - sewer, Chief Technology Officer estate book  Social Needs  . Financial resource strain: Not on file  . Food insecurity:    Worry: Not on file    Inability: Not on file  . Transportation needs:    Medical: Not on file    Non-medical: Not on file  Tobacco Use  . Smoking status: Never Smoker  . Smokeless tobacco: Never Used  Substance and Sexual Activity  . Alcohol use: No    Alcohol/week: 0.0 standard drinks  . Drug use: No  . Sexual activity: Not Currently    Birth control/protection: Post-menopausal  Lifestyle  . Physical activity:    Days per week: Not on file    Minutes per session: Not on file  . Stress: Not on file  Relationships  . Social connections:    Talks on phone: Not on file    Gets together: Not  on file    Attends religious service: Not on file    Active member of club or organization: Not on file    Attends meetings of clubs or organizations: Not on file    Relationship status: Not on file  Other Topics Concern  . 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 Hydrocodeine [Dihydrocodeine] Other (See Comments)    Rapid heartbeat  . Fosamax [Alendronate Sodium] Other (See Comments)    GI upset   Family History  Problem Relation Age of  Onset  . Hyperlipidemia Mother   . Heart disease Mother   . Hypertension Mother   . Alzheimer's disease Mother   . Pulmonary embolism Mother   . Cancer Father        unsure of type  . Pulmonary embolism Brother   . Arthritis Brother   . Arthritis Brother   . Alzheimer's disease Maternal Grandmother   . Cancer Maternal Grandmother   . Healthy Daughter   . Healthy Son   . Lung cancer Maternal Aunt        non smoker  . Cancer Maternal Uncle   . Colon cancer Maternal Uncle   . Colon cancer Maternal Grandfather          Current Outpatient Medications (Other):  Marland Kitchen  CALCIUM PO, Take by mouth. .  famotidine (PEPCID) 20 MG tablet, One at bedtime .  Ibuprofen-diphenhydrAMINE Cit (IBUPROFEN PM PO), Take by mouth. Marland Kitchen  VITAMIN D PO, Take by mouth.    Past medical history, social, surgical and family history all reviewed in electronic medical record.  No pertanent information unless stated regarding to the chief complaint.   Review of Systems:  No headache, visual changes, nausea, vomiting, diarrhea, constipation, dizziness, abdominal pain, skin rash, fevers, chills, night sweats, weight loss, swollen lymph nodes, body aches, joint swelling, muscle aches, chest pain, shortness of breath, mood changes.   Objective     General: No apparent distress alert and oriented x3 mood and affect normal, dressed appropriately.      Impression and Recommendations:     This case required medical decision making of moderate complexity. The above documentation has been reviewed and is accurate and complete Lyndal Pulley, DO       Note: This dictation was prepared with Dragon dictation along with smaller phrase technology. Any transcriptional errors that result from this process are unintentional.

## 2019-04-27 ENCOUNTER — Telehealth: Payer: Self-pay | Admitting: *Deleted

## 2019-04-27 NOTE — Telephone Encounter (Signed)
Error

## 2019-05-11 ENCOUNTER — Other Ambulatory Visit: Payer: Self-pay

## 2019-05-11 ENCOUNTER — Ambulatory Visit (INDEPENDENT_AMBULATORY_CARE_PROVIDER_SITE_OTHER): Payer: PPO | Admitting: Family Medicine

## 2019-05-11 VITALS — BP 108/74 | HR 76 | Ht 66.0 in | Wt 143.0 lb

## 2019-05-11 DIAGNOSIS — M7501 Adhesive capsulitis of right shoulder: Secondary | ICD-10-CM

## 2019-05-11 MED ORDER — VITAMIN D (ERGOCALCIFEROL) 1.25 MG (50000 UNIT) PO CAPS: 50000.0000 [IU] | ORAL_CAPSULE | ORAL | 0 refills | Status: DC

## 2019-05-11 NOTE — Patient Instructions (Signed)
Good to see you PT will call you Exercise 3 times a week Vitamin D sent in  See me again in 6 weeks

## 2019-05-11 NOTE — Progress Notes (Signed)
[facility] [place], [place] [address] Phone: [phone] Subjective:   Jenny [name], am serving as a scribe for Dr. Hulan Saas.  I'm seeing this patient by the request  of:    CC: Right shoulder pain  [id number]   11/17/2018 Patient appeared to seem to respond well to the injection previously.  We discussed icing regimen and home exercises.  We discussed which activities to do which wants to avoid.  Patient is to increase activity slowly over the course the next several weeks.  Follow-up again as needed  Update 05/11/2019 Jenny [name] is a 68 y.o. female coming in with complaint of right shoulder pain. States that her pain is not improving. Constant ache in the right trap/scalene. Pain is sharp with certain movements. Patient is unable to perform ADLs without pain. Had injection previously that did not help.  This was by another provider initially     Past Medical History:  Diagnosis Date  . Essential tremor 09/21/2018  . GERD (gastroesophageal reflux disease)   . Laryngopharyngeal reflux (LPR) 09/01/2017   Had eval by GI and pulm  . Osteoporosis    by dexa 2009 had se of fosamax   Past Surgical History:  Procedure Laterality Date  . COLONOSCOPY    . TONSILLECTOMY     age 55   Social History   Socioeconomic History  . Marital status: Married    Spouse name: Jenny [name]  . Number of children: 2  . Years of education: Not on file  . Highest education level: Not on file  Occupational History  . Occupation: retired - sewer, [employer] estate book  Social Needs  . Financial resource strain: Not on file  . Food insecurity    Worry: Not on file    Inability: Not on file  . Transportation needs    Medical: Not on file    Non-medical: Not on file  Tobacco Use  . Smoking status: Never Smoker  . Smokeless tobacco: Never Used  Substance and Sexual Activity  . Alcohol use: [name]    Alcohol/week: 0.0 standard drinks  . Drug  use: [name]  . Sexual activity: Not Currently    Birth control/protection: Post-menopausal  Lifestyle  . Physical activity    Days per week: Not on file    Minutes per session: Not on file  . Stress: Not on file  Relationships  . Social Herbalist on phone: Not on file    Gets together: Not on file    Attends religious service: Not on file    Active member of club or organization: Not on file    Attends meetings of clubs or organizations: Not on file    Relationship status: Not on file  Other Topics Concern  . 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 Hydrocodeine [Dihydrocodeine] Other (See Comments)    Rapid heartbeat  . Fosamax [Alendronate Sodium] Other (See Comments)    GI upset   Family History  Problem Relation Age of Onset  . Hyperlipidemia Mother   . Heart disease Mother   . Hypertension Mother   . Alzheimer's disease Mother   . Pulmonary embolism Mother   . Cancer Father  unsure of type  . Pulmonary embolism Brother   . Arthritis Brother   . Arthritis Brother   . Alzheimer's disease Maternal Grandmother   . Cancer Maternal Grandmother   . Healthy Daughter   . Healthy Son   . Lung cancer Maternal Aunt        non smoker  . Cancer Maternal Uncle   . Colon cancer Maternal Uncle   . Colon cancer Maternal Grandfather          Current Outpatient Medications (Other):  Marland Kitchen  CALCIUM PO, Take by mouth. .  famotidine (PEPCID) 20 MG tablet, One at bedtime .  Ibuprofen-diphenhydrAMINE Cit (IBUPROFEN PM PO), Take by mouth. Marland Kitchen  VITAMIN D PO, Take by mouth. .  Vitamin D, Ergocalciferol, (DRISDOL) 1.25 MG (50000 UT) CAPS capsule, Take 1 capsule (50,000 Units total) by mouth every 7 (seven) days.    Past medical history, social, surgical and family history all  reviewed in electronic medical record.  [name] pertanent information unless stated regarding to the chief complaint.   Review of Systems:  [name] headache, visual changes, nausea, vomiting, diarrhea, constipation, dizziness, abdominal pain, skin rash, fevers, chills, night sweats, weight loss, swollen lymph nodes, body aches, joint swelling, muscle aches, chest pain, shortness of breath, mood changes.   Objective  Blood pressure 108/74, pulse 76, height 5\' 6"  (1.676 m), weight 143 lb (64.9 kg), SpO2 98 %.   General: [name] apparent distress alert and oriented x3 mood and affect normal, dressed appropriately.  HEENT: Pupils equal, extraocular movements intact  Respiratory: Patient's speak in full sentences and does not appear short of breath  Cardiovascular: [name] lower extremity edema, non tender, [name] erythema  Skin: Warm dry intact with [name] signs of infection or rash on extremities or on axial skeleton.  Abdomen: Soft nontender  Neuro: Cranial nerves II through XII are intact, neurovascularly intact in all extremities with 2+ DTRs and 2+ pulses.  Lymph: [name] lymphadenopathy of posterior or anterior cervical chain or axillae bilaterally.  Gait normal with good balance and coordination.  MSK:  Non tender with full range of motion and good stability and symmetric strength and tone of , elbows, wrist, hip, knee and ankles bilaterally.  Right shoulder exam shows that patient does have some mild atrophy compared to the contralateral side and patient's previous exam.  Significant loss of range of motion in all planes of at least 5 degrees.  5 degrees of only external rotation noted today.  Cuff strength seems to be intact.   Impression and Recommendations:      The above documentation has been reviewed and is accurate and complete Lyndal Pulley, DO       Note: This dictation was prepared with Dragon dictation along with smaller phrase technology. Any transcriptional errors that result from this process are  unintentional.

## 2019-05-12 ENCOUNTER — Encounter: Payer: Self-pay | Admitting: Family Medicine

## 2019-05-12 DIAGNOSIS — M7500 Adhesive capsulitis of unspecified shoulder: Secondary | ICD-10-CM | POA: Insufficient documentation

## 2019-05-12 NOTE — Assessment & Plan Note (Addendum)
Patient has what appears to be more of a frozen shoulder at this time.  Discussed with patient about icing regimen, sent to formal physical therapy, patient declined another injection at the moment.  States that this will may be helpful though if this continues.  Once weekly vitamin D given.  Follow-up again 6 weeks discussed x-rays which patient declined

## 2019-06-14 ENCOUNTER — Other Ambulatory Visit: Payer: Self-pay | Admitting: Family Medicine

## 2019-06-14 DIAGNOSIS — Z1231 Encounter for screening mammogram for malignant neoplasm of breast: Secondary | ICD-10-CM

## 2019-06-22 ENCOUNTER — Ambulatory Visit: Payer: PPO | Admitting: Family Medicine

## 2019-08-05 ENCOUNTER — Other Ambulatory Visit: Payer: Self-pay

## 2019-08-05 ENCOUNTER — Ambulatory Visit
Admission: RE | Admit: 2019-08-05 | Discharge: 2019-08-05 | Disposition: A | Discharge status: Home / Self Care | Payer: Medicare Other | Source: Ambulatory Visit | Attending: Family Medicine | Admitting: Family Medicine

## 2019-08-05 DIAGNOSIS — Z1231 Encounter for screening mammogram for malignant neoplasm of breast: Secondary | ICD-10-CM

## 2019-08-19 ENCOUNTER — Telehealth: Payer: Self-pay

## 2019-08-19 ENCOUNTER — Other Ambulatory Visit: Payer: Self-pay

## 2019-08-19 DIAGNOSIS — G8929 Other chronic pain: Secondary | ICD-10-CM

## 2019-08-19 NOTE — Telephone Encounter (Signed)
Referral sent and informed patient.

## 2019-08-19 NOTE — Telephone Encounter (Signed)
Patient called back wanting to know if the PT referral can be placed again for [facility]. Patient had a lot going on at the end of the year and when she called to make an appointment the referral was closed and they needed another one. Patient would like a call back when or if it can be done

## 2019-08-29 DIAGNOSIS — H52203 Unspecified astigmatism, bilateral: Secondary | ICD-10-CM | POA: Diagnosis not present

## 2019-08-29 DIAGNOSIS — H18513 Endothelial corneal dystrophy, bilateral: Secondary | ICD-10-CM | POA: Diagnosis not present

## 2019-08-29 DIAGNOSIS — H25813 Combined forms of age-related cataract, bilateral: Secondary | ICD-10-CM | POA: Diagnosis not present

## 2019-08-29 DIAGNOSIS — H527 Unspecified disorder of refraction: Secondary | ICD-10-CM | POA: Diagnosis not present

## 2019-08-31 ENCOUNTER — Encounter: Payer: Self-pay | Admitting: Physical Therapy

## 2019-08-31 ENCOUNTER — Ambulatory Visit: Payer: Medicare Other | Admitting: Physical Therapy

## 2019-08-31 ENCOUNTER — Other Ambulatory Visit: Payer: Self-pay

## 2019-08-31 DIAGNOSIS — M25611 Stiffness of right shoulder, not elsewhere classified: Secondary | ICD-10-CM | POA: Diagnosis not present

## 2019-08-31 DIAGNOSIS — M25511 Pain in right shoulder: Secondary | ICD-10-CM

## 2019-08-31 DIAGNOSIS — G8929 Other chronic pain: Secondary | ICD-10-CM | POA: Diagnosis not present

## 2019-08-31 NOTE — Patient Instructions (Signed)
Access Code: [email or web address]  URL: [email or web address]  Date: 08/31/2019  Prepared by: Lyndee Hensen   Exercises Supine Shoulder Flexion with Dowel - 10 reps - 1 sets - 2x daily Supine Shoulder Flexion PROM - 10 reps - 1 sets - 2x daily Supine Shoulder External Rotation with Dowel - 10 reps - 1 sets - 2x daily Seated Shoulder Flexion Towel Slide at Table Top - 10 reps - 1 sets - 2x daily

## 2019-08-31 NOTE — Therapy (Signed)
[facility] [address] [place], [place], [address] Phone: [phone]   Fax:  [phone]  Physical Therapy Evaluation  Patient Details  Name: Jenny [name] MRN: [id number] Date of Birth: May 06, 1951 Referring Provider (PT): Charlann Boxer   Encounter Date: 08/31/2019  PT End of Session - 08/31/19 1546    Visit Number  1    Number of Visits  12    Date for PT Re-Evaluation  10/12/19    Authorization Type  UHC    PT Start Time  1500    PT Stop Time  1537    PT Time Calculation (min)  37 min    Activity Tolerance  Patient tolerated treatment well    Behavior During Therapy  [facility] for tasks assessed/performed       Past Medical History:  Diagnosis Date  . Essential tremor 09/21/2018  . GERD (gastroesophageal reflux disease)   . Laryngopharyngeal reflux (LPR) 09/01/2017   Had eval by GI and pulm  . Osteoporosis    by dexa 2009 had se of fosamax    Past Surgical History:  Procedure Laterality Date  . COLONOSCOPY    . TONSILLECTOMY     age 69    There were no vitals filed for this visit.   Subjective Assessment - 08/31/19 1503    Subjective  Pt states ongoing problem in R shoulder, for about 1 year already. She had injection a while back, did not help much. Pain is variable, depending on activity. Arm is very stiff. Has difficulty with ADLs. Sore at night She is retired    Limitations  Reading;Lifting;Writing;House hold activities    Patient Stated Goals  improved motion, decreased pain    Currently in Pain?  Yes    Pain Score  5     Pain Location  Shoulder    Pain Orientation  Right    Pain Descriptors / Indicators  Dull;Sore    Pain Type  Chronic pain    Pain Onset  More than a month ago    Pain Frequency  Intermittent    Aggravating Factors   use, activity    Pain Relieving Factors  none stated         [facility] PT Assessment - 08/31/19 0001      Assessment   Medical Diagnosis  R shoulder pain    Referring  Provider (PT)  Charlann Boxer    Hand Dominance  Left    Prior Therapy  No      Balance Screen   Has the patient fallen in the past 6 months  No      Prior Function   Level of Independence  Independent      Cognition   Overall Cognitive Status  Within Functional Limits for tasks assessed      ROM / Strength   AROM / PROM / Strength  AROM;PROM;Strength      AROM   AROM Assessment Site  Shoulder    Right/Left Shoulder  Right    Right Shoulder Flexion  90 Degrees    Right Shoulder ABduction  65 Degrees    Right Shoulder Internal Rotation  55 Degrees    Right Shoulder External Rotation  55 Degrees      PROM   PROM Assessment Site  Shoulder    Right/Left Shoulder  Right    Right Shoulder Flexion  110 Degrees    Right Shoulder ABduction  95 Degrees    Right Shoulder Internal Rotation  55 Degrees    Right Shoulder External Rotation  55 Degrees      Strength   Strength Assessment Site  Shoulder    Right/Left Shoulder  Right    Right Shoulder Flexion  3-/5    Right Shoulder ABduction  3-/5    Right Shoulder Internal Rotation  3-/5    Right Shoulder External Rotation  3-/5      Palpation   Palpation comment  hypomobile R GHJ for all motions, tightness in subscap and scapular muscles       Special Tests   Other special tests  n/a                Objective measurements completed on examination: See above findings.      [facility] PT Treatment/Exercise - 08/31/19 0001      Exercises   Exercises  Shoulder      Shoulder Exercises: Supine   External Rotation  AAROM;10 reps    External Rotation Limitations  cane    Flexion  AAROM;15 reps    Flexion Limitations  cane      Shoulder Exercises: Seated   Other Seated Exercises  Table slides x15       Manual Therapy   Manual Therapy  Joint mobilization;Passive ROM    Joint Mobilization  GHJ mobs gr 2/3 all directions, Shoulder LAD    Passive ROM  All motions R shoulder              PT Education - 08/31/19  1545    Education Details  PT POC, HEP, Exam findings.    Person(s) Educated  Patient    Methods  Explanation;Demonstration;Tactile cues;Verbal cues;Handout    Comprehension  Verbalized understanding;Returned demonstration;Verbal cues required;Tactile cues required;Need further instruction       PT Short Term Goals - 08/31/19 1547      PT SHORT TERM GOAL #1   Title  Pt to be independent wtih initial HEP    Time  2    Period  Weeks    Status  New    Target Date  09/14/19      PT SHORT TERM GOAL #2   Title  Pt to demo increased shoulder AROM by 10 degrees    Time  3    Period  Weeks    Status  New    Target Date  09/21/19        PT Long Term Goals - 08/31/19 1549      PT LONG TERM GOAL #1   Title  Pt to report improved shoulder AROM to be [facility], to improve ability for ADLS. and IADLS    Time  6    Period  Weeks    Status  New    Target Date  10/12/19      PT LONG TERM GOAL #2   Title  Pt to report decreased pain in R shoulder, to 0-2/10 with activity    Time  6    Period  Weeks    Status  New    Target Date  10/12/19      PT LONG TERM GOAL #3   Title  Pt to be independent wthi final HEP for shoulder    Time  6    Period  Weeks    Status  New    Target Date  10/12/19      PT LONG TERM GOAL #4   Title  Pt to demo ability for reaching behind back  and behind head, to be [facility], for improved ablity for ADLS.    Time  6    Period  Weeks    Status  New    Target Date  10/12/19             Plan - 08/31/19 1552    Clinical Impression Statement  Pt presents with primary complaint of pain in R shoulder. Pt with signficant stiffness and hypombility in R GHJ, limited with all motions. Pt with decreased strength as well. Pt with loss of functional motions, of reaching overhead, behind back, and behind head. Pt with significant limitation for ADLs and IADLS, due to stiffness. Pt to benefit from skilled PT to improve.    Personal Factors and Comorbidities  Time since onset  of injury/illness/exacerbation    Examination-Activity Limitations  Bathing;Reach Overhead;Bed Mobility;Carry;Sleep;Dressing;Lift;Hygiene/Grooming    Examination-Participation Restrictions  Meal Prep;Cleaning;Personal Finances;Community Activity;Driving;Shop;Laundry;Yard Work    Stability/Clinical Decision Making  Stable/Uncomplicated    Clinical Decision Making  Low    Rehab Potential  Good    PT Frequency  2x / week    PT Duration  6 weeks    PT Treatment/Interventions  ADLs/Self Care Home Management;Cryotherapy;Electrical Stimulation;DME Instruction;Ultrasound;Moist Heat;Traction;Iontophoresis 4mg /ml Dexamethasone;Therapeutic activities;Therapeutic exercise;Neuromuscular re-education;Manual techniques;Patient/family education;Passive range of motion;Dry needling;Taping;Vasopneumatic Device;Joint Manipulations;Spinal Manipulations    Consulted and Agree with Plan of Care  Patient       Patient will benefit from skilled therapeutic intervention in order to improve the following deficits and impairments:  Decreased range of motion, Impaired UE functional use, Increased muscle spasms, Decreased activity tolerance, Pain, Hypomobility, Impaired flexibility, Decreased strength, Decreased mobility  Visit Diagnosis: Chronic right shoulder pain - Plan: PT plan of care cert/re-cert  Stiffness of shoulder joint, right - Plan: PT plan of care cert/re-cert     Problem List Patient Active Problem List   Diagnosis Date Noted  . 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 Rotator cuff syndrome of right shoulder 11/17/2018  . Essential tremor 09/21/2018  . Laryngopharyngeal reflux (LPR) 09/01/2017  . Family history of Alzheimer's disease 10/30/2014  . GERD (gastroesophageal reflux disease) 07/25/2013  . Osteoporosis 06/17/2011    Lyndee Hensen, PT, DPT 4:00 PM  08/31/19    [place], [place], [address] Phone: [phone]    Fax:  [phone]  Name: Jenny [name] MRN: [id number] Date of Birth: [birth year]

## 2019-09-02 ENCOUNTER — Encounter: Payer: Medicare Other | Admitting: Physical Therapy

## 2019-09-07 ENCOUNTER — Encounter: Payer: Self-pay | Admitting: Physical Therapy

## 2019-09-07 ENCOUNTER — Ambulatory Visit: Payer: Medicare Other | Admitting: Physical Therapy

## 2019-09-07 ENCOUNTER — Other Ambulatory Visit: Payer: Self-pay

## 2019-09-07 DIAGNOSIS — M25611 Stiffness of right shoulder, not elsewhere classified: Secondary | ICD-10-CM

## 2019-09-07 DIAGNOSIS — M25511 Pain in right shoulder: Secondary | ICD-10-CM

## 2019-09-07 DIAGNOSIS — G8929 Other chronic pain: Secondary | ICD-10-CM | POA: Diagnosis not present

## 2019-09-07 NOTE — Therapy (Addendum)
[facility] [address] [place], [place], [address] Phone: [phone]   Fax:  [phone]  Physical Therapy Treatment  Patient Details  Name: Jenny [name] MRN: [id number] Date of Birth: 1950/11/03 Referring Provider (PT): Charlann Boxer   Encounter Date: 09/07/2019  PT End of Session - 09/07/19 1154     Visit Number  2    Number of Visits  12    Date for PT Re-Evaluation  10/12/19    Authorization Type  UHC    PT Start Time  1102    PT Stop Time  1144    PT Time Calculation (min)  42 min    Activity Tolerance  Patient tolerated treatment well    Behavior During Therapy  [facility] for tasks assessed/performed        Past Medical History:  Diagnosis Date   Essential tremor 09/21/2018   GERD (gastroesophageal reflux disease)    Laryngopharyngeal reflux (LPR) 09/01/2017   Had eval by GI and pulm   Osteoporosis    by dexa 2009 had se of fosamax    Past Surgical History:  Procedure Laterality Date   COLONOSCOPY     TONSILLECTOMY     age 66    There were no vitals filed for this visit.  Subjective Assessment - 09/07/19 1153     Subjective  Pt with no new complaints. Has been doing HEP. Has several other appts in next couple weeks, getting cataracts removed.    Currently in Pain?  Yes    Pain Score  5     Pain Location  Shoulder    Pain Orientation  Right    Pain Descriptors / Indicators  Dull;Sore    Pain Type  Chronic pain    Pain Onset  More than a month ago    Pain Frequency  Intermittent          [facility] PT Assessment - 09/07/19 0001       PROM   Right Shoulder Flexion  124 Degrees                    [facility] Adult PT Treatment/Exercise - 09/07/19 1101       Exercises   Exercises  Shoulder      Shoulder Exercises: Supine   External Rotation  AAROM;15 reps    External Rotation Limitations  cane    Flexion  AAROM;20 reps    Flexion Limitations  cane      Shoulder Exercises: Seated   Other  Seated Exercises  --      Shoulder Exercises: Standing   Row  20 reps    Theraband Level (Shoulder Row)  Level 2 (Red)      Shoulder Exercises: Pulleys   Flexion  2 minutes    ABduction  1 minute      Shoulder Exercises: ROM/Strengthening   Wall Wash  wall slides x20 , 2 hands       Shoulder Exercises: Stretch   Corner Stretch  3 reps;20 seconds    Corner Stretch Limitations  doorway / Painful     Cross Chest Stretch  3 reps;10 seconds    Cross Chest Stretch Limitations  post capsule stretch /self       Manual Therapy   Manual Therapy  Joint mobilization;Passive ROM    Joint Mobilization  GHJ mobs gr 2/3 all directions, Shoulder LAD    Passive ROM  All motions R shoulder , subscap release  PT Short Term Goals - 08/31/19 1547       PT SHORT TERM GOAL #1   Title  Pt to be independent wtih initial HEP    Time  2    Period  Weeks    Status  New    Target Date  09/14/19      PT SHORT TERM GOAL #2   Title  Pt to demo increased shoulder AROM by 10 degrees    Time  3    Period  Weeks    Status  New    Target Date  09/21/19         PT Long Term Goals - 08/31/19 1549       PT LONG TERM GOAL #1   Title  Pt to report improved shoulder AROM to be [facility], to improve ability for ADLS. and IADLS    Time  6    Period  Weeks    Status  New    Target Date  10/12/19      PT LONG TERM GOAL #2   Title  Pt to report decreased pain in R shoulder, to 0-2/10 with activity    Time  6    Period  Weeks    Status  New    Target Date  10/12/19      PT LONG TERM GOAL #3   Title  Pt to be independent wthi final HEP for shoulder    Time  6    Period  Weeks    Status  New    Target Date  10/12/19      PT LONG TERM GOAL #4   Title  Pt to demo ability for reaching behind back and behind head, to be [facility], for improved ablity for ADLS.    Time  6    Period  Weeks    Status  New    Target Date  10/12/19             Plan - 09/07/19 1155     Clinical  Impression Statement  Focus on ROM with manual and ther ex. Pt with some improvments in flexion today. Subscap very restricted, as well as posterior capsule. Plan to progress as tolerated.    Personal Factors and Comorbidities  Time since onset of injury/illness/exacerbation    Examination-Activity Limitations  Bathing;Reach Overhead;Bed Mobility;Carry;Sleep;Dressing;Lift;Hygiene/Grooming    Examination-Participation Restrictions  Meal Prep;Cleaning;Personal Finances;Community Activity;Driving;Shop;Laundry;Yard Work    Stability/Clinical Decision Making  Stable/Uncomplicated    Rehab Potential  Good    PT Frequency  2x / week    PT Duration  6 weeks    PT Treatment/Interventions  ADLs/Self Care Home Management;Cryotherapy;Electrical Stimulation;DME Instruction;Ultrasound;Moist Heat;Traction;Iontophoresis 90m/ml Dexamethasone;Therapeutic activities;Therapeutic exercise;Neuromuscular re-education;Manual techniques;Patient/family education;Passive range of motion;Dry needling;Taping;Vasopneumatic Device;Joint Manipulations;Spinal Manipulations    Consulted and Agree with Plan of Care  Patient        Patient will benefit from skilled therapeutic intervention in order to improve the following deficits and impairments:  Decreased range of motion, Impaired UE functional use, Increased muscle spasms, Decreased activity tolerance, Pain, Hypomobility, Impaired flexibility, Decreased strength, Decreased mobility  Visit Diagnosis: Chronic right shoulder pain  Stiffness of shoulder joint, right     Problem List Patient Active Problem List   Diagnosis Date Noted   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  Patient goals were not met. Patient is being discharged due to not returning since last visit     Lyndee Hensen, PT, DPT 3:23 PM  07/10/21

## 2019-09-08 ENCOUNTER — Telehealth: Payer: Self-pay | Admitting: Family Medicine

## 2019-09-08 NOTE — Telephone Encounter (Signed)
Pt called asking if it is okay for her to use a Tens Unit on shoulder. Please advise.

## 2019-09-10 DIAGNOSIS — Z20822 Contact with and (suspected) exposure to covid-19: Secondary | ICD-10-CM | POA: Diagnosis not present

## 2019-09-10 DIAGNOSIS — Z01812 Encounter for preprocedural laboratory examination: Secondary | ICD-10-CM | POA: Diagnosis not present

## 2019-09-10 DIAGNOSIS — Z01818 Encounter for other preprocedural examination: Secondary | ICD-10-CM | POA: Diagnosis not present

## 2019-09-10 DIAGNOSIS — H25811 Combined forms of age-related cataract, right eye: Secondary | ICD-10-CM | POA: Diagnosis not present

## 2019-09-13 DIAGNOSIS — K219 Gastro-esophageal reflux disease without esophagitis: Secondary | ICD-10-CM | POA: Diagnosis not present

## 2019-09-13 DIAGNOSIS — Z8249 Family history of ischemic heart disease and other diseases of the circulatory system: Secondary | ICD-10-CM | POA: Diagnosis not present

## 2019-09-13 DIAGNOSIS — H527 Unspecified disorder of refraction: Secondary | ICD-10-CM | POA: Diagnosis not present

## 2019-09-13 DIAGNOSIS — Z885 Allergy status to narcotic agent status: Secondary | ICD-10-CM | POA: Diagnosis not present

## 2019-09-13 DIAGNOSIS — H2511 Age-related nuclear cataract, right eye: Secondary | ICD-10-CM | POA: Diagnosis not present

## 2019-09-13 DIAGNOSIS — Z882 Allergy status to sulfonamides status: Secondary | ICD-10-CM | POA: Diagnosis not present

## 2019-09-13 DIAGNOSIS — H25811 Combined forms of age-related cataract, right eye: Secondary | ICD-10-CM | POA: Diagnosis not present

## 2019-09-13 DIAGNOSIS — H52203 Unspecified astigmatism, bilateral: Secondary | ICD-10-CM | POA: Diagnosis not present

## 2019-09-13 DIAGNOSIS — H25813 Combined forms of age-related cataract, bilateral: Secondary | ICD-10-CM | POA: Diagnosis not present

## 2019-09-14 DIAGNOSIS — H52203 Unspecified astigmatism, bilateral: Secondary | ICD-10-CM | POA: Diagnosis not present

## 2019-09-14 DIAGNOSIS — H25812 Combined forms of age-related cataract, left eye: Secondary | ICD-10-CM | POA: Diagnosis not present

## 2019-09-14 DIAGNOSIS — Z961 Presence of intraocular lens: Secondary | ICD-10-CM | POA: Diagnosis not present

## 2019-09-14 DIAGNOSIS — H18513 Endothelial corneal dystrophy, bilateral: Secondary | ICD-10-CM | POA: Diagnosis not present

## 2019-09-14 DIAGNOSIS — H527 Unspecified disorder of refraction: Secondary | ICD-10-CM | POA: Diagnosis not present

## 2019-09-20 ENCOUNTER — Telehealth: Payer: Self-pay | Admitting: Family Medicine

## 2019-09-20 NOTE — Telephone Encounter (Signed)
Left message for patient to call back and schedule Medicare Annual Wellness Visit (AWV) either virtually/audio only OR in office. Whatever the patients preference is.  Last AWV 2.27.20; please schedule at anytime with [facility]-Nurse Health Advisor at [facility].

## 2019-11-01 ENCOUNTER — Telehealth: Payer: Self-pay | Admitting: Family Medicine

## 2019-11-01 NOTE — Telephone Encounter (Signed)
I called the patient to schedule AWV with [name].  She said that she's out of town right now and requested a call back next week.

## 2020-02-13 DIAGNOSIS — Z20822 Contact with and (suspected) exposure to covid-19: Secondary | ICD-10-CM | POA: Diagnosis not present

## 2020-03-05 ENCOUNTER — Ambulatory Visit (INDEPENDENT_AMBULATORY_CARE_PROVIDER_SITE_OTHER): Payer: Medicare Other | Admitting: Family Medicine

## 2020-03-05 ENCOUNTER — Encounter: Payer: Self-pay | Admitting: Family Medicine

## 2020-03-05 ENCOUNTER — Other Ambulatory Visit: Payer: Self-pay

## 2020-03-05 VITALS — BP 118/78 | HR 78 | Temp 98.0°F | Resp 18 | Ht 66.0 in | Wt 133.0 lb

## 2020-03-05 DIAGNOSIS — G250 Essential tremor: Secondary | ICD-10-CM | POA: Diagnosis not present

## 2020-03-05 DIAGNOSIS — K219 Gastro-esophageal reflux disease without esophagitis: Secondary | ICD-10-CM | POA: Diagnosis not present

## 2020-03-05 DIAGNOSIS — M810 Age-related osteoporosis without current pathological fracture: Secondary | ICD-10-CM

## 2020-03-05 DIAGNOSIS — Z0000 Encounter for general adult medical examination without abnormal findings: Secondary | ICD-10-CM | POA: Diagnosis not present

## 2020-03-05 DIAGNOSIS — Z820 Family history of epilepsy and other diseases of the nervous system: Secondary | ICD-10-CM | POA: Diagnosis not present

## 2020-03-05 DIAGNOSIS — M7501 Adhesive capsulitis of right shoulder: Secondary | ICD-10-CM

## 2020-03-05 DIAGNOSIS — E559 Vitamin D deficiency, unspecified: Secondary | ICD-10-CM

## 2020-03-05 DIAGNOSIS — Z13220 Encounter for screening for lipoid disorders: Secondary | ICD-10-CM | POA: Diagnosis not present

## 2020-03-05 NOTE — Progress Notes (Signed)
Subjective  Chief Complaint  Patient presents with  . Annual Exam    Black coffee (no cream or sugar)  . Health Maintenance    Dexa scan due    HPI: Jenny [name] is a 69 y.o. female who presents to [facility] today for a Female Wellness Visit. She also has the concerns and/or needs as listed above in the chief complaint. These will be addressed in addition to the Health Maintenance Visit.   Wellness Visit: annual visit with health maintenance review and exam without Pap   HM: mammo and CRC scr up to date and nl. Overdue for dexa/f/u osteoporosis. Has h/o low Vit D. No longer taking calcium but takes a multivitamin. Feels fine.  Chronic disease f/u and/or acute problem visit: (deemed necessary to be done in addition to the wellness visit):  Adhesive capsulitis: limited range of motion. Stopped PT. Uses TENS unit periodically; hasn't seen ortho but would prefer to NOT have surgery.  GERD/LPR: rare sx. Uses prn pepcid  Tremor: mild and not bothersome  Memory: due AWV. Has no concerns at this time  Assessment  1. Annual physical exam   2. Family history of Alzheimer's disease   3. Age-related osteoporosis without current pathological fracture   4. Gastroesophageal reflux disease without esophagitis   5. Laryngopharyngeal reflux (LPR)   6. Essential tremor   7. Adhesive capsulitis of right shoulder   8. Vitamin D deficiency      Plan  Female Wellness Visit:  Age appropriate Health Maintenance and Prevention measures were discussed with patient. Included topics are cancer screening recommendations, ways to keep healthy (see AVS) including dietary and exercise recommendations, regular eye and dental care, use of seat belts, and avoidance of moderate alcohol use and tobacco use. Due mammo in January; dexa now. Then would recommend prolia. Discussed ca and vit D intake goals. She will try nutirionally.   BMI: discussed patient's BMI and encouraged  positive lifestyle modifications to help get to or maintain a target BMI.  HM needs and immunizations were addressed and ordered. See below for orders. See HM and immunization section for updates.  Routine labs and screening tests ordered including cmp, cbc and lipids where appropriate.  Discussed recommendations regarding Vit D and calcium supplementation (see AVS)  Chronic disease management visit and/or acute problem visit:  Osteoporosis: recheck dexa now and would be a good prolia candidate, failed fosamax  GERD/LPR is well controlled.   Frozen shoulder: counseled. Agree with home exercises. Would consider ortho referral if pain or limitations worsen to discuss her options.   Tremor: monitor. No intervention needed at this time.   Follow up: Return in about 1 year (around 03/05/2021) for complete physical, AWV at patient's convenience.   Orders Placed This Encounter  Procedures  . DG Bone Density  . COMPLETE METABOLIC PANEL WITH GFR  . CBC with Differential/Platelet  . Lipid panel  . VITAMIN D 25 Hydroxy (Vit-D Deficiency, Fractures)   No orders of the defined types were placed in this encounter.     Lifestyle: Body mass index is 21.47 kg/m. Wt Readings from Last 3 Encounters:  03/05/20 133 lb (60.3 kg)  05/11/19 143 lb (64.9 kg)  09/30/18 143 lb 3.2 oz (65 kg)     Patient Active Problem List   Diagnosis Date Noted  . 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 Rotator cuff syndrome of right shoulder 11/17/2018  . Essential tremor 09/21/2018  . Laryngopharyngeal reflux (LPR) 09/01/2017  Had eval by GI and pulm   . Family history of Alzheimer's disease 10/30/2014  . GERD (gastroesophageal reflux disease) 07/25/2013    EGD and colonoscopy Dr. Modena Jansky in [place] 2019: normal results   . Osteoporosis 06/17/2011    By dexa in 2009  -2.8 ls spine needs fu.  Apparently had se of fosmax in the past.    Health Maintenance  Topic Date Due  . DEXA SCAN   02/28/2011  . INFLUENZA VACCINE  02/12/2020  . MAMMOGRAM  08/04/2020  . COLONOSCOPY  07/15/2027  . TETANUS/TDAP  09/19/2027  . COVID-19 Vaccine  Completed  . Hepatitis C Screening  Completed  . PNA vac Low Risk Adult  Completed   Immunization History  Administered Date(s) Administered  . Influenza Split 04/22/2012, 04/24/2013  . Influenza Whole 04/13/2009  . Influenza, High Dose Seasonal PF 04/13/2017, 03/28/2018, 03/28/2019  . Influenza-Unspecified 05/03/2014, 04/17/2016  . PFIZER SARS-COV-2 Vaccination 08/27/2019, 09/21/2019  . Pneumococcal Conjugate-13 10/30/2014  . Pneumococcal Polysaccharide-23 09/01/2017  . Td 07/14/2006  . Tdap 09/18/2017  . Zoster 08/13/2012  . Zoster Recombinat (Shingrix) 01/27/2018, 05/12/2018   We updated and reviewed the patient's past history in detail and it is documented below. Allergies: Patient is allergic to codeine, hydrocodeine [dihydrocodeine], and fosamax [alendronate sodium]. Past Medical History Patient  has a past medical history of Essential tremor (09/21/2018), GERD (gastroesophageal reflux disease), Laryngopharyngeal reflux (LPR) (09/01/2017), and Osteoporosis. Past Surgical History Patient  has a past surgical history that includes Tonsillectomy and Colonoscopy. Family History: Patient family history includes Alzheimer's disease in her maternal grandmother and mother; Arthritis in her brother and brother; Cancer in her father, maternal grandmother, and maternal uncle; Colon cancer in her maternal grandfather and maternal uncle; Healthy in her daughter and son; Heart disease in her mother; Hyperlipidemia in her mother; Hypertension in her mother; Lung cancer in her maternal aunt; Pulmonary embolism in her brother and mother. Social History:  Patient  reports that she has never smoked. She has never used smokeless tobacco. She reports that she does not drink alcohol and does not use drugs.  Review of Systems: Constitutional: negative for  fever or malaise Ophthalmic: negative for photophobia, double vision or loss of vision Cardiovascular: negative for chest pain, dyspnea on exertion, or new LE swelling Respiratory: negative for SOB or persistent cough Gastrointestinal: negative for abdominal pain, change in bowel habits or melena Genitourinary: negative for dysuria or gross hematuria, no abnormal uterine bleeding or disharge Musculoskeletal: negative for new gait disturbance or muscular weakness Integumentary: negative for new or persistent rashes, no breast lumps Neurological: negative for TIA or stroke symptoms Psychiatric: negative for SI or delusions Allergic/Immunologic: negative for hives  Patient Care Team    Relationship Specialty Notifications Start End  Leamon Arnt, MD PCP - General Family Medicine  09/01/17   Ladene Artist, MD  Gastroenterology  06/16/11    Comment: ? vs Bronwen Betters, MD Consulting Physician Pulmonary Disease  09/01/17     Objective  Vitals: BP 118/78   Pulse 78   Temp 98 F (36.7 C) (Temporal)   Resp 18   Ht 5\' 6"  (1.676 m)   Wt 133 lb (60.3 kg)   SpO2 96%   BMI 21.47 kg/m  General:  Well developed, well nourished, no acute distress  Psych:  Alert and orientedx3,normal mood and affect HEENT:  Normocephalic, atraumatic, non-icteric sclera,  supple neck without adenopathy, mass or thyromegaly Cardiovascular:  Normal S1, S2, RRR without  gallop, rub or murmur Respiratory:  Good breath sounds bilaterally, CTAB with normal respiratory effort Gastrointestinal: normal bowel sounds, soft, non-tender, no noted masses. No HSM MSK: no deformities, contusions. Joints are without erythema or swelling.  Skin:  Warm, no rashes or suspicious lesions noted Neurologic:    Mental status is normal. CN 2-11 are normal. Gross motor and sensory exams are normal. Normal gait. Mild essential tremor Breast Exam: No mass, skin retraction or nipple discharge is appreciated in either breast. No  axillary adenopathy. Fibrocystic changes are not noted    Commons side effects, risks, benefits, and alternatives for medications and treatment plan prescribed today were discussed, and the patient expressed understanding of the given instructions. Patient is instructed to call or message via MyChart if he/she has any questions or concerns regarding our treatment plan. No barriers to understanding were identified. We discussed Red Flag symptoms and signs in detail. Patient expressed understanding regarding what to do in case of urgent or emergency type symptoms.   Medication list was reconciled, printed and provided to the patient in AVS. Patient instructions and summary information was reviewed with the patient as documented in the AVS. This note was prepared with assistance of Dragon voice recognition software. Occasional wrong-word or sound-a-like substitutions may have occurred due to the inherent limitations of voice recognition software  This visit occurred during the SARS-CoV-2 public health emergency.  Safety protocols were in place, including screening questions prior to the visit, additional usage of staff PPE, and extensive cleaning of exam room while observing appropriate contact time as indicated for disinfecting solutions.

## 2020-03-05 NOTE — Patient Instructions (Addendum)
Please return in 12 months for your annual complete physical; please come fasting. Medicare recommends an Annual Wellness Visit for all patients. Please schedule this to be done with our Nurse Educator. This is an informative "talk" visit; it's goals are to ensure that your health care needs are being met and to give you education regarding avoiding falls, ensuring you are not suffering from depression or problems with memory or thinking, and to educate you on Advance Care Planning. It helps me take good care of you!   If you have any questions or concerns, please don't hesitate to send me a message via MyChart or call the office at [phone]. Thank you for visiting with [place] today! It's our pleasure caring for you.  I have ordered a mammogram and/or bone density for you as we discussed today: your mammogram is not due until January so I would recommend getting your bone density done now.  []   Mammogram  [x]   Bone Density  Please call the office checked below to schedule your appointment: Your appointment will at the following location  [x]   [facility]      [place], [place]         []   [facility]  [address], [place]    Preventive Care 65 Years and Older, Female Preventive care refers to lifestyle choices and visits with your health care provider that can promote health and wellness. This includes:  A yearly physical exam. This is also called an annual well check.  Regular dental and eye exams.  Immunizations.  Screening for certain conditions.  Healthy lifestyle choices, such as diet and exercise. What can I expect for my preventive care visit? Physical exam Your health care provider will check:  Height and weight. These may be used to calculate body mass index (BMI), which is a measurement that tells if you are at a healthy weight.  Heart rate and blood  pressure.  Your skin for abnormal spots. Counseling Your health care provider may ask you questions about:  Alcohol, tobacco, and drug use.  Emotional well-being.  Home and relationship well-being.  Sexual activity.  Eating habits.  History of falls.  Memory and ability to understand (cognition).  Work and work Statistician.  Pregnancy and menstrual history. What immunizations do I need?  Influenza (flu) vaccine  This is recommended every year. Tetanus, diphtheria, and pertussis (Tdap) vaccine  You may need a Td booster every 10 years. Varicella (chickenpox) vaccine  You may need this vaccine if you have not already been vaccinated. Zoster (shingles) vaccine  You may need this after age 55. Pneumococcal conjugate (PCV13) vaccine  One dose is recommended after age 61. Pneumococcal polysaccharide (PPSV23) vaccine  One dose is recommended after age 18. Measles, mumps, and rubella (MMR) vaccine  You may need at least one dose of MMR if you were born in 1957 or later. You may also need a second dose. Meningococcal conjugate (MenACWY) vaccine  You may need this if you have certain conditions. Hepatitis A vaccine  You may need this if you have certain conditions or if you travel or work in places where you may be exposed to hepatitis A. Hepatitis B vaccine  You may need this if you have certain conditions or if you travel or work in places where you may be exposed to hepatitis  B. Haemophilus influenzae type b (Hib) vaccine  You may need this if you have certain conditions. You may receive vaccines as individual doses or as more than one vaccine together in one shot (combination vaccines). Talk with your health care provider about the risks and benefits of combination vaccines. What tests do I need? Blood tests  Lipid and cholesterol levels. These may be checked every 5 years, or more frequently depending on your overall health.  Hepatitis C test.  Hepatitis B  test. Screening  Lung cancer screening. You may have this screening every year starting at age 48 if you have a 30-pack-year history of smoking and currently smoke or have quit within the past 15 years.  Colorectal cancer screening. All adults should have this screening starting at age 29 and continuing until age 60. Your health care provider may recommend screening at age 80 if you are at increased risk. You will have tests every 1-10 years, depending on your results and the type of screening test.  Diabetes screening. This is done by checking your blood sugar (glucose) after you have not eaten for a while (fasting). You may have this done every 1-3 years.  Mammogram. This may be done every 1-2 years. Talk with your health care provider about how often you should have regular mammograms.  BRCA-related cancer screening. This may be done if you have a family history of breast, ovarian, tubal, or peritoneal cancers. Other tests  Sexually transmitted disease (STD) testing.  Bone density scan. This is done to screen for osteoporosis. You may have this done starting at age 22. Follow these instructions at home: Eating and drinking  Eat a diet that includes fresh fruits and vegetables, whole grains, lean protein, and low-fat dairy products. Limit your intake of foods with high amounts of sugar, saturated fats, and salt.  Take vitamin and mineral supplements as recommended by your health care provider.  Do not drink alcohol if your health care provider tells you not to drink.  If you drink alcohol: ? Limit how much you have to 0-1 drink a day. ? Be aware of how much alcohol is in your drink. In the [place] one drink equals one 12 oz bottle of beer (355 mL), one 5 oz glass of wine (148 mL), or one 1 oz glass of hard liquor (44 mL). Lifestyle  Take daily care of your teeth and gums.  Stay active. Exercise for at least 30 minutes on 5 or more days each week.  Do not use any products that  contain nicotine or tobacco, such as cigarettes, e-cigarettes, and chewing tobacco. If you need help quitting, ask your health care provider.  If you are sexually active, practice safe sex. Use a condom or other form of protection in order to prevent STIs (sexually transmitted infections).  Talk with your health care provider about taking a low-dose aspirin or statin. What's next?  Go to your health care provider once a year for a well check visit.  Ask your health care provider how often you should have your eyes and teeth checked.  Stay up to date on all vaccines. This information is not intended to replace advice given to you by your health care provider. Make sure you discuss any questions you have with your health care provider. Document Revised: 06/24/2018 Document Reviewed: 06/24/2018 Elsevier Patient Education  2020 [employer].

## 2020-03-06 ENCOUNTER — Encounter: Payer: Self-pay | Admitting: Family Medicine

## 2020-03-06 LAB — COMPLETE METABOLIC PANEL WITH GFR
AG Ratio: 1.7 (calc) (ref 1.0–2.5)
ALT: 10 U/L (ref 6–29)
AST: 16 U/L (ref 10–35)
Albumin: 4.5 g/dL (ref 3.6–5.1)
Alkaline phosphatase (APISO): 97 U/L (ref 37–153)
BUN: 19 mg/dL (ref 7–25)
CO2: 27 mmol/L (ref 20–32)
Calcium: 9.6 mg/dL (ref 8.6–10.4)
Chloride: 104 mmol/L (ref 98–110)
Creat: 0.73 mg/dL (ref 0.60–0.93)
GFR, Est African American: 97 mL/min/{1.73_m2} (ref >=60)
GFR, Est Non African American: 83 mL/min/{1.73_m2} (ref >=60)
Globulin: 2.6 g/dL (calc) (ref 1.9–3.7)
Glucose, Bld: 92 mg/dL (ref 65–99)
Potassium: 3.9 mmol/L (ref 3.5–5.3)
Sodium: 139 mmol/L (ref 135–146)
Total Bilirubin: 0.6 mg/dL (ref 0.2–1.2)
Total Protein: 7.1 g/dL (ref 6.1–8.1)

## 2020-03-06 LAB — CBC WITH DIFFERENTIAL/PLATELET
Absolute Monocytes: 455 cells/uL (ref 200–950)
Basophils Absolute: 70 cells/uL (ref 0–200)
Basophils Relative: 1.7 %
Eosinophils Absolute: 131 cells/uL (ref 15–500)
Eosinophils Relative: 3.2 %
HCT: 40.2 % (ref 35.0–45.0)
Hemoglobin: 13.2 g/dL (ref 11.7–15.5)
Lymphs Abs: 1509 cells/uL (ref 850–3900)
MCH: 29.5 pg (ref 27.0–33.0)
MCHC: 32.8 g/dL (ref 32.0–36.0)
MCV: 89.7 fL (ref 80.0–100.0)
MPV: 11.2 fL (ref 7.5–12.5)
Monocytes Relative: 11.1 %
Neutro Abs: 1935 cells/uL (ref 1500–7800)
Neutrophils Relative %: 47.2 %
Platelets: 228 10*3/uL (ref 140–400)
RBC: 4.48 10*6/uL (ref 3.80–5.10)
RDW: 12.6 % (ref 11.0–15.0)
Total Lymphocyte: 36.8 %
WBC: 4.1 10*3/uL (ref 3.8–10.8)

## 2020-03-06 LAB — VITAMIN D 25 HYDROXY (VIT D DEFICIENCY, FRACTURES): Vit D, 25-Hydroxy: 85 ng/mL (ref 30–100)

## 2020-03-06 LAB — LIPID PANEL
Cholesterol: 229 mg/dL — ABNORMAL HIGH (ref <200)
HDL: 66 mg/dL (ref >=50)
LDL Cholesterol (Calc): 143 mg/dL (calc) — ABNORMAL HIGH
Non-HDL Cholesterol (Calc): 163 mg/dL (calc) — ABNORMAL HIGH (ref <130)
Total CHOL/HDL Ratio: 3.5 (calc) (ref <5.0)
Triglycerides: 95 mg/dL (ref <150)

## 2020-03-06 NOTE — Progress Notes (Signed)
Please call patient: let her know her labs look great although cholesterol levels are trending higher. Work on diet. We will check again in a year. She could consider a cholesterol lowering medication but I would think she'd prefer to wait.  Please mail her the result letter as well. Thanks!

## 2020-03-09 ENCOUNTER — Other Ambulatory Visit: Payer: Self-pay | Admitting: Family Medicine

## 2020-03-09 DIAGNOSIS — Z0000 Encounter for general adult medical examination without abnormal findings: Secondary | ICD-10-CM

## 2020-03-09 DIAGNOSIS — Z1231 Encounter for screening mammogram for malignant neoplasm of breast: Secondary | ICD-10-CM

## 2020-03-09 DIAGNOSIS — M810 Age-related osteoporosis without current pathological fracture: Secondary | ICD-10-CM

## 2020-04-25 DIAGNOSIS — H903 Sensorineural hearing loss, bilateral: Secondary | ICD-10-CM | POA: Diagnosis not present

## 2020-06-12 ENCOUNTER — Other Ambulatory Visit: Payer: Self-pay

## 2020-06-12 ENCOUNTER — Ambulatory Visit
Admission: RE | Admit: 2020-06-12 | Discharge: 2020-06-12 | Disposition: A | Discharge status: Home / Self Care | Payer: Medicare Other | Source: Ambulatory Visit | Attending: Family Medicine | Admitting: Family Medicine

## 2020-06-12 DIAGNOSIS — Z0000 Encounter for general adult medical examination without abnormal findings: Secondary | ICD-10-CM

## 2020-06-12 DIAGNOSIS — Z780 Asymptomatic menopausal state: Secondary | ICD-10-CM | POA: Diagnosis not present

## 2020-06-12 DIAGNOSIS — M810 Age-related osteoporosis without current pathological fracture: Secondary | ICD-10-CM

## 2020-06-12 IMAGING — US US CAROTID DUPLEX BILAT
1 series · 13 of 24 positions shown · non-contrast
Comparison: None.

CLINICAL DATA: Retinal hemorrhage.

EXAM:
BILATERAL CAROTID DUPLEX ULTRASOUND
TECHNIQUE: Gray scale imaging, color Doppler and duplex ultrasound were
performed of bilateral carotid and vertebral arteries in the neck.

[Series 1: us carotid duplex bilat · 0.05mm/px · 13 of 45 slices shown]
[im 1/45]
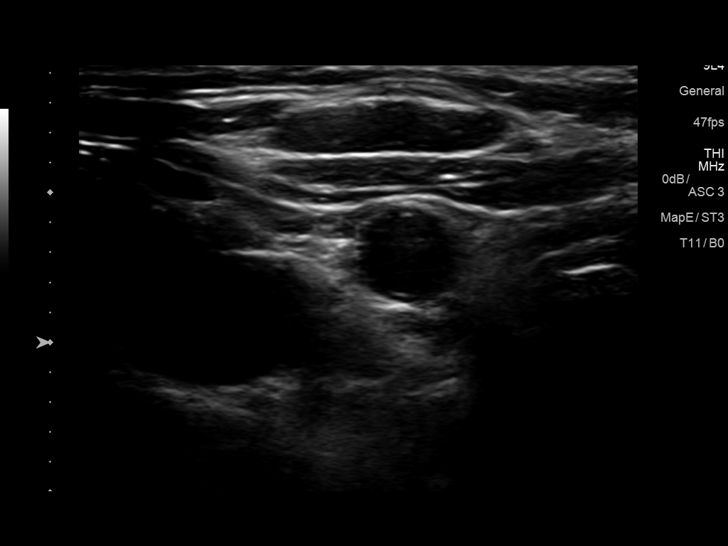
[im 4/45]
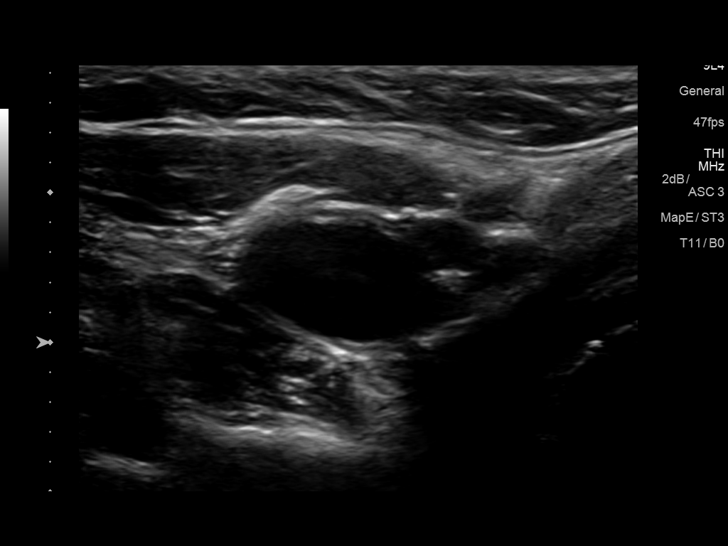
[im 8/45]
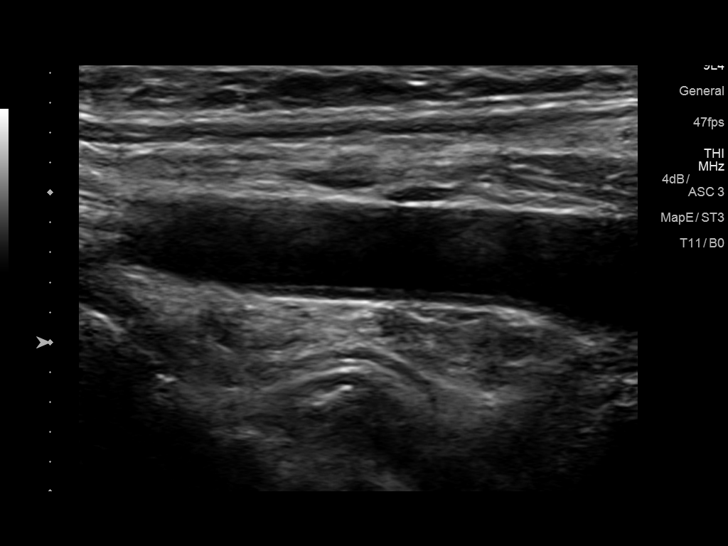
[im 12/45]
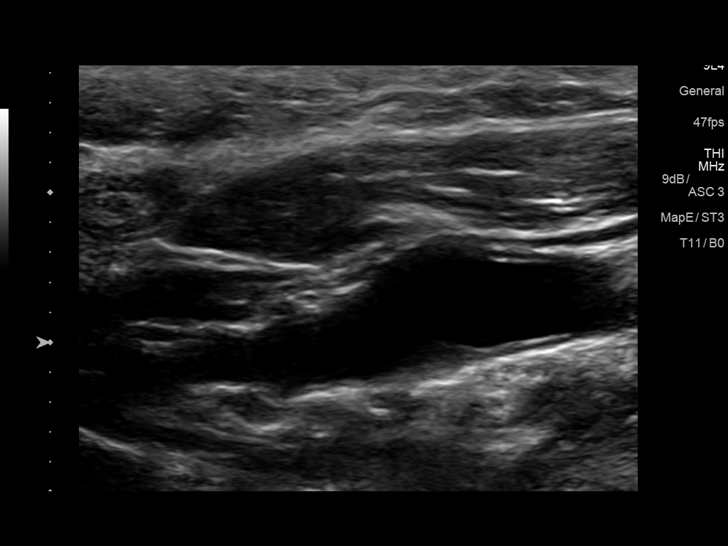
[im 16/45]
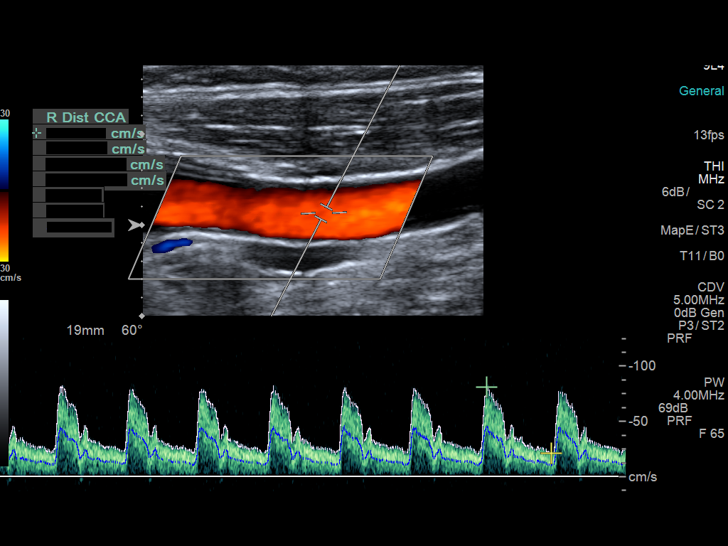
[im 20/45]
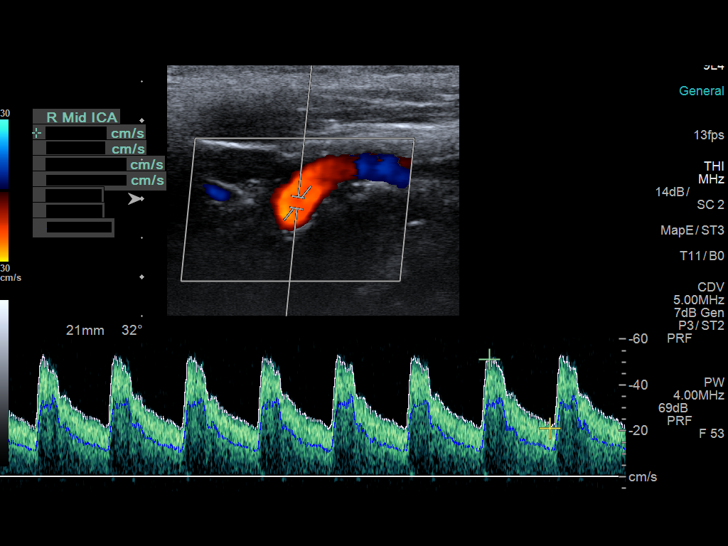
[im 23/45]
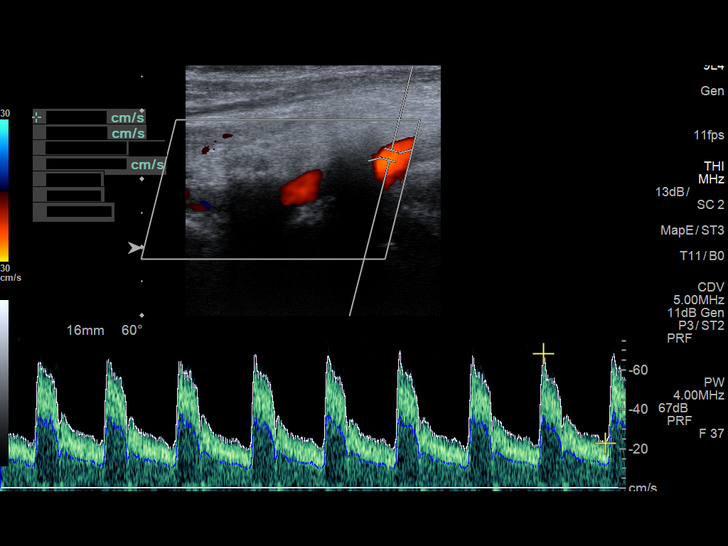
[im 25/45]
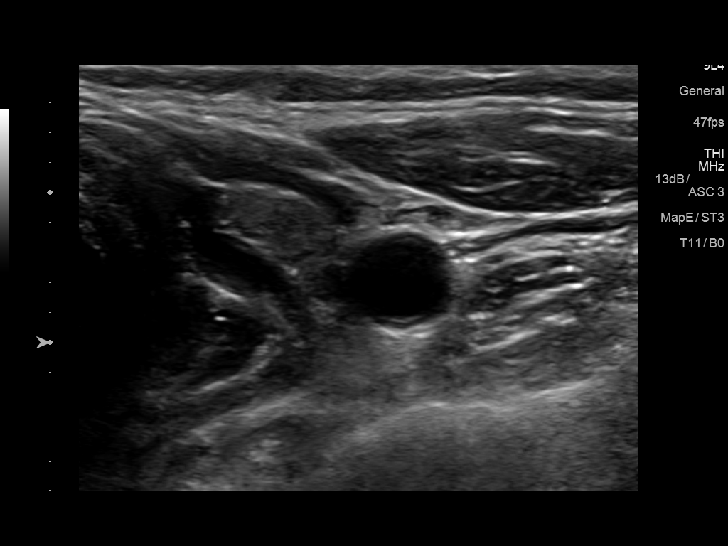
[im 29/45]
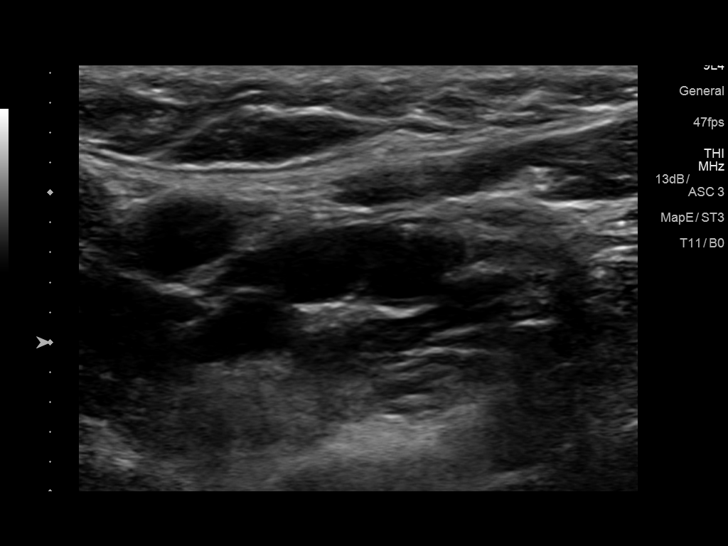
[im 33/45]
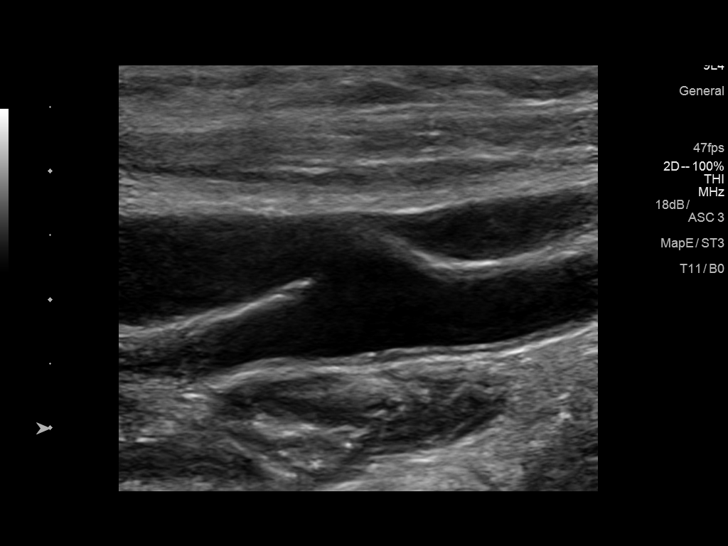
[im 37/45]
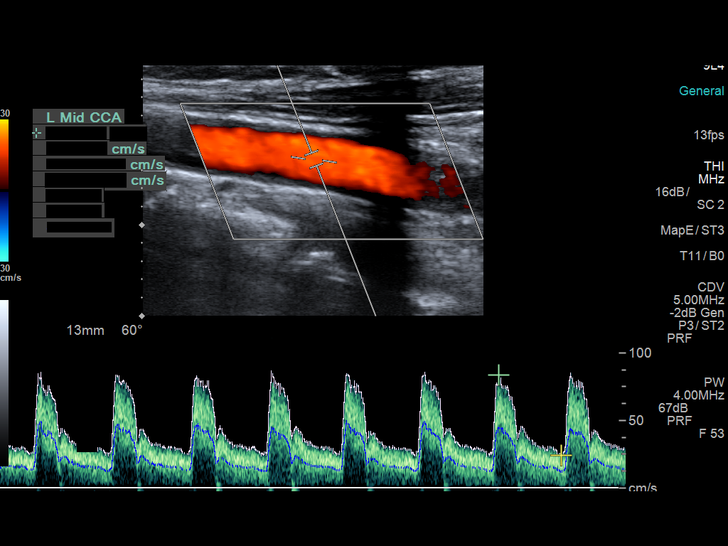
[im 41/45]
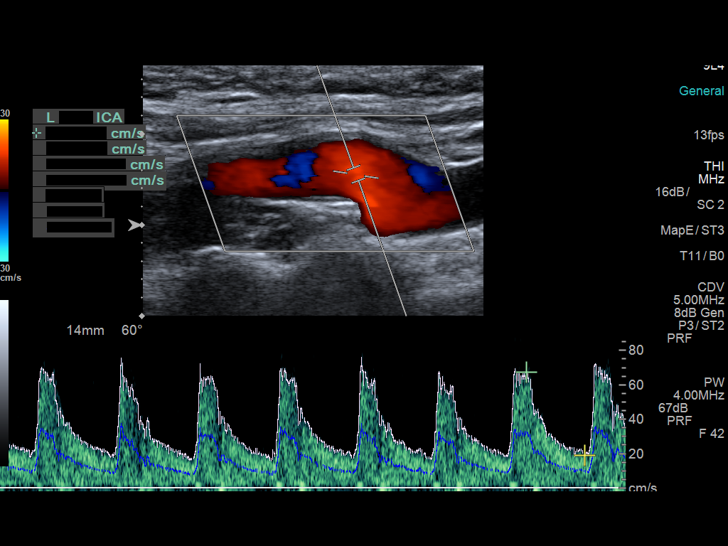
[im 45/45]
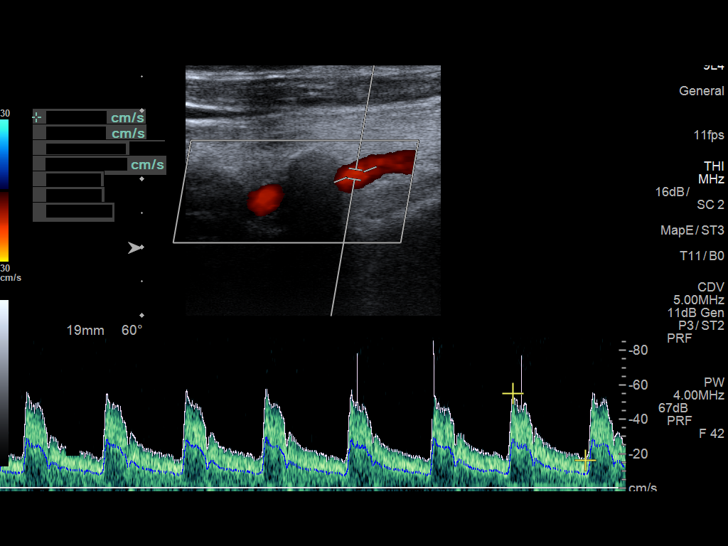

[13 of 24 positions shown; findings below may reference images not displayed]

FINDINGS: Criteria: Quantification of carotid stenosis is based on velocity
parameters that correlate the residual internal carotid diameter
with NASCET-based stenosis levels, using the diameter of the distal
internal carotid lumen as the denominator for stenosis measurement.

The following velocity measurements were obtained:

RIGHT

ICA:  73/25 cm/sec

CCA:  91/23 cm/sec

SYSTOLIC ICA/CCA RATIO:

ECA:  65 cm/sec

LEFT

ICA:  67/19 cm/sec

CCA:  86/22 cm/sec

SYSTOLIC ICA/CCA RATIO:

ECA:  57 cm/sec

RIGHT CAROTID ARTERY: Mild common carotid intimal thickening
extending into the carotid bulb. No significant focal plaque
identified. No evidence of right ICA plaque or stenosis.

RIGHT VERTEBRAL ARTERY: Antegrade flow with normal waveform and
velocity.

LEFT CAROTID ARTERY: Intimal thickening of the common carotid
artery. No focal plaque identified. No evidence of left ICA plaque
or stenosis.

LEFT VERTEBRAL ARTERY: Antegrade flow with normal waveform and
velocity.
IMPRESSION: No evidence of plaque or ICA stenosis bilaterally. Mild intimal
thickening present in the common carotid arteries bilaterally.

## 2020-06-13 NOTE — Progress Notes (Signed)
Please call patient: I have reviewed her bone density results.  They show worsening osteoporosis which puts at increased risk of fractures. I recommend treatment. Prolia injections can be started; OR OV to discuss further.  Rec calcium 600bid and vit D 1000 units daily as well as regular weight bearing exercise.    Dexa 05/2020 lowest T = -3.3, Lspine; osteoporosis, needs treatment. Rec prolia. Failed fosamax.

## 2020-06-19 ENCOUNTER — Ambulatory Visit (INDEPENDENT_AMBULATORY_CARE_PROVIDER_SITE_OTHER): Payer: Medicare Other | Admitting: Family Medicine

## 2020-06-19 ENCOUNTER — Other Ambulatory Visit: Payer: Self-pay

## 2020-06-19 ENCOUNTER — Encounter: Payer: Self-pay | Admitting: Family Medicine

## 2020-06-19 ENCOUNTER — Telehealth: Payer: Self-pay

## 2020-06-19 VITALS — BP 116/76 | HR 89 | Temp 97.4°F | Ht 66.0 in | Wt 135.2 lb

## 2020-06-19 DIAGNOSIS — K219 Gastro-esophageal reflux disease without esophagitis: Secondary | ICD-10-CM

## 2020-06-19 DIAGNOSIS — G44229 Chronic tension-type headache, not intractable: Secondary | ICD-10-CM | POA: Diagnosis not present

## 2020-06-19 DIAGNOSIS — M810 Age-related osteoporosis without current pathological fracture: Secondary | ICD-10-CM

## 2020-06-19 DIAGNOSIS — F430 Acute stress reaction: Secondary | ICD-10-CM

## 2020-06-19 NOTE — Telephone Encounter (Signed)
LMOVM for patient to return call with Medicare ID number, NOT advantage plan member number.

## 2020-06-19 NOTE — Telephone Encounter (Signed)
Medicare ID is [id number]

## 2020-06-19 NOTE — Patient Instructions (Addendum)
Please return in 4 weeks for recheck.   Please start omeprazole 28m daily for the next 4-6 weeks in place of the pepcid. Take OTC zyrtec 170mnightly.  Work on stress reduction.  Let me know if you need medication for your nerves.  We will start the process for getting your set up for prolia injections to treat your osteoporosis.   If you have any questions or concerns, please don't hesitate to send me a message via MyChart or call the office at [phone] you for visiting [place] [facility]! It's our pleasure caring for you.   Stress, Adult Stress is a normal reaction to life events. Stress is what you feel when life demands more than you are used to, or more than you think you can handle. Some stress can be useful, such as studying for a test or meeting a deadline at work. Stress that occurs too often or for too long can cause problems. It can affect your emotional health and interfere with relationships and normal daily activities. Too much stress can weaken your body's defense system (immune system) and increase your risk for physical illness. If you already have a medical problem, stress can make it worse. What are the causes? All sorts of life events can cause stress. An event that causes stress for one person may not be stressful for another person. Major life events, whether positive or negative, commonly cause stress. Examples include:  Losing a job or starting a new job.  Losing a loved one.  Moving to a new town or home.  Getting married or divorced.  Having a baby.  Getting injured or sick. Less obvious life events can also cause stress, especially if they occur day after day or in combination with each other. Examples include:  Working long hours.  Driving in traffic.  Caring for children.  Being in debt.  Being in a difficult relationship. What are the signs or symptoms? Stress can cause emotional symptoms, including:  Anxiety. This is feeling worried,  afraid, on edge, overwhelmed, or out of control.  Anger, including irritation or impatience.  Depression. This is feeling sad, down, helpless, or guilty.  Trouble focusing, remembering, or making decisions. Stress can cause physical symptoms, including:  Aches and pains. These may affect your head, neck, back, stomach, or other areas of your body.  Tight muscles or a clenched jaw.  Low energy.  Trouble sleeping. Stress can cause unhealthy behaviors, including:  Eating to feel better (overeating) or skipping meals.  Working too much or putting off tasks.  Smoking, drinking alcohol, or using drugs to feel better. How is this diagnosed? Stress is diagnosed through an assessment by your health care provider. He or she may diagnose this condition based on:  Your symptoms and any stressful life events.  Your medical history.  Tests to rule out other causes of your symptoms. Depending on your condition, your health care provider may refer you to a specialist for further evaluation. How is this treated?  Stress management techniques are the recommended treatment for stress. Medicine is not typically recommended for the treatment of stress. Techniques to reduce your reaction to stressful life events include:  Stress identification. Monitor yourself for symptoms of stress and identify what causes stress for you. These skills may help you to avoid or prepare for stressful events.  Time management. Set your priorities, keep a calendar of events, and learn to say no. Taking these actions can help you avoid making too many commitments.  Techniques for coping with stress include:  Rethinking the problem. Try to think realistically about stressful events rather than ignoring them or overreacting. Try to find the positives in a stressful situation rather than focusing on the negatives.  Exercise. Physical exercise can release both physical and emotional tension. The key is to find a form of  exercise that you enjoy and do it regularly.  Relaxation techniques. These relax the body and mind. The key is to find one or more that you enjoy and use the techniques regularly. Examples include: ? Meditation, deep breathing, or progressive relaxation techniques. ? Yoga or tai chi. ? Biofeedback, mindfulness techniques, or journaling. ? Listening to music, being out in nature, or participating in other hobbies.  Practicing a healthy lifestyle. Eat a balanced diet, drink plenty of water, limit or avoid caffeine, and get plenty of sleep.  Having a strong support network. Spend time with family, friends, or other people you enjoy being around. Express your feelings and talk things over with someone you trust. Counseling or talk therapy with a mental health professional may be helpful if you are having trouble managing stress on your own. Follow these instructions at home: Lifestyle   Avoid drugs.  Do not use any products that contain nicotine or tobacco, such as cigarettes, e-cigarettes, and chewing tobacco. If you need help quitting, ask your health care provider.  Limit alcohol intake to no more than 1 drink a day for nonpregnant women and 2 drinks a day for men. One drink equals 12 oz of beer, 5 oz of wine, or 1 oz of hard liquor  Do not use alcohol or drugs to relax.  Eat a balanced diet that includes fresh fruits and vegetables, whole grains, lean meats, fish, eggs, and beans, and low-fat dairy. Avoid processed foods and foods high in added fat, sugar, and salt.  Exercise at least 30 minutes on 5 or more days each week.  Get 7-8 hours of sleep each night. General instructions   Practice stress management techniques as discussed with your health care provider.  Drink enough fluid to keep your urine clear or pale yellow.  Take over-the-counter and prescription medicines only as told by your health care provider.  Keep all follow-up visits as told by your health care provider.  This is important. Contact a health care provider if:  Your symptoms get worse.  You have new symptoms.  You feel overwhelmed by your problems and can no longer manage them on your own. Get help right away if:  You have thoughts of hurting yourself or others. If you ever feel like you may hurt yourself or others, or have thoughts about taking your own life, get help right away. You can go to your nearest emergency department or call:  Your local emergency services (911 in the [place]  A suicide crisis helpline, such as the [facility] at 203-438-8312. This is open 24 hours a day. Summary  Stress is a normal reaction to life events. It can cause problems if it happens too often or for too long.  Practicing stress management techniques is the best way to treat stress.  Counseling or talk therapy with a mental health professional may be helpful if you are having trouble managing stress on your own. This information is not intended to replace advice given to you by your health care provider. Make sure you discuss any questions you have with your health care provider. Document Revised: 01/28/2019 Document Reviewed: 08/20/2016 Elsevier Patient  Education  [place].   Tension Headache, Adult A tension headache is a feeling of pain, pressure, or aching in the head that is often felt over the front and sides of the head. The pain can be dull, or it can feel tight (constricting). There are two types of tension headache:  Episodic tension headache. This is when the headaches happen fewer than 15 days a month.  Chronic tension headache. This is when the headaches happen more than 15 days a month during a 76-monthperiod. A tension headache can last from 30 minutes to several days. It is the most common kind of headache. Tension headaches are not normally associated with nausea or vomiting, and they do not get worse with physical activity. What are the causes?  The exact cause of this condition is not known. Tension headaches are often triggered by stress, anxiety, or depression. Other triggers include:  Alcohol.  Too much caffeine or caffeine withdrawal.  Respiratory infections, such as colds, flu, or sinus infections.  Dental problems or teeth clenching.  Tiredness (fatigue).  Holding your head and neck in the same position for a long period of time, such as while using a computer.  Smoking.  Arthritis of the neck. What are the signs or symptoms? Symptoms of this condition include:  A feeling of pressure or tightness around the head.  Dull, aching head pain.  Pain over the front and sides of the head.  Tenderness in the muscles of the head, neck, and shoulders. How is this diagnosed? This condition may be diagnosed based on your symptoms, your medical history, and a physical exam. If your symptoms are severe or unusual, you may have imaging tests, such as a CT scan or an MRI of your head. Your vision may also be checked. How is this treated? This condition may be treated with lifestyle changes and with medicines that help relieve symptoms. Follow these instructions at home: Managing pain  Take over-the-counter and prescription medicines only as told by your health care provider.  When you have a headache, lie down in a dark, quiet room.  If directed, apply ice to the head and neck: ? Put ice in a plastic bag. ? Place a towel between your skin and the bag. ? Leave the ice on for 20 minutes, 2-3 times a day.  If directed, apply heat to the back of your neck as often as told by your health care provider. Use the heat source that your health care provider recommends, such as a moist heat pack or a heating pad. ? Place a towel between your skin and the heat source. ? Leave the heat on for 20-30 minutes. ? Remove the heat if your skin turns bright red. This is especially important if you are unable to feel pain, heat, or cold. You  may have a greater risk of getting burned. Eating and drinking  Eat meals on a regular schedule.  Limit alcohol intake to no more than 1 drink a day for nonpregnant women and 2 drinks a day for men. One drink equals 12 oz of beer, 5 oz of wine, or 1 oz of hard liquor.  Drink enough fluid to keep your urine pale yellow.  Decrease your caffeine intake, or stop using caffeine. Lifestyle  Get 7-9 hours of sleep each night, or get the amount of sleep recommended by your health care provider.  At bedtime, remove all electronic devices from your room. Electronic devices include computers, phones, and tablets.  Find ways to manage your stress. Some things that can help relieve stress include: ? Exercise. ? Deep breathing exercises. ? Yoga. ? Listening to music. ? Positive mental imagery.  Try to sit up straight and avoid tensing your muscles.  Do not use any products that contain nicotine or tobacco, such as cigarettes and e-cigarettes. If you need help quitting, ask your health care provider. General instructions   Keep all follow-up visits as told by your health care provider. This is important.  Avoid any headache triggers. Keep a headache journal to help find out what may trigger your headaches. For example, write down: ? What you eat and drink. ? How much sleep you get. ? Any change to your diet or medicines. Contact a health care provider if:  Your headache does not get better.  Your headache comes back.  You are sensitive to sounds, light, or smells because of a headache.  You have nausea or you vomit.  Your stomach hurts. Get help right away if:  You suddenly develop a very severe headache along with any of the following: ? A stiff neck. ? Nausea and vomiting. ? Confusion. ? Weakness. ? Double vision or loss of vision. ? Shortness of breath. ? Rash. ? Unusual sleepiness. ? Fever. ? Trouble speaking. ? Pain in your eyes or ears. ? Trouble walking or  balancing. ? Feeling faint or passing out. Summary  A tension headache is a feeling of pain, pressure, or aching in the head that is often felt over the front and sides of the head.  A tension headache can last from 30 minutes to several days. It is the most common kind of headache.  This condition may be diagnosed based on your symptoms, your medical history, and a physical exam.  This condition may be treated with lifestyle changes and with medicines that help relieve symptoms. This information is not intended to replace advice given to you by your health care provider. Make sure you discuss any questions you have with your health care provider. Document Revised: 04/27/2019 Document Reviewed: 10/10/2016 Elsevier Patient Education  [place].

## 2020-06-19 NOTE — Progress Notes (Signed)
Subjective  CC:  Chief Complaint  Patient presents with  . chest tightness    for several weeks, family hx of heart disease  . DEXA    discuss osteoporosis treatment  . Headache    taking two Tylenol every morning, does help with symptoms    HPI: Jenny [name] is a 69 y.o. female who presents to the office today to address the problems listed above in the chief complaint.  69 year old mostly healthy female presents due to headaches, tightness in the chest, difficulty with deep breaths, some reflux symptoms over the last several months.  She is very concerned something could be wrong with her.  During interview, she reports her husband was diagnosed with aggressive bladder cancer about 2 months ago.  This is when her symptoms started.  She admits to tension headaches, bifrontal daily in the morning responsive to Tylenol.  She denies vision changes, dysarthria or paresis.  She has a history of GERD and LPR for which she takes Pepcid daily.  She denies epigastric pain or melena.  No nausea or vomiting.  She is concerned she could have cancer.  She has no history of chronic anxiety nor panic attacks.  She denies perioral numbness or peripheral numbness and tingling.  She has no exertional symptoms.  No dyspnea on exertion.  No lower extremity edema, pleuritic chest pain or shortness of breath.  She has no risk factors for ischemic cardiac disease  Osteoporosis: Reviewed recent bone density showing T score -3.3.  She has failed Fosamax due to side effects in the past.  She takes calcium and vitamin D.  Her most recent vitamin D levels and calcium levels were normal.  She is open to starting medications.  She like to discuss her options.  She has had no fractures.   Assessment  1. Laryngopharyngeal reflux (LPR)   2. Gastroesophageal reflux disease without esophagitis   3. Stress reaction   4. Chronic tension-type headache, not intractable   5. Age-related osteoporosis without current  pathological fracture      Plan   Symptom complex consistent with stress reaction exacerbating LPR and GERD, tension headaches and anxiety: Counseling done.  Recommend stress reduction, stress management, starting treatment for LPR with omeprazole 20 mg daily for the next 4 to 6 weeks in place of Pepcid.  Recommend Zyrtec nightly given some postnasal drainage symptoms.  Stop daily Tylenol and recommend stress management for tension headaches.  Could have component of medication rebound headaches.  Education given.  No red flag symptoms noted.  Inconsistent with ischemic heart disease.  Reassured.  Close follow-up recommended.  Consideration of Xanax if needed.  Patient defers for now.  Osteoporosis: Failed Fosamax.  Recommend Prolia.  Patient agrees.  Will get set up.  Continue calcium and vitamin D.  Follow up: 4 to 6 weeks for recheck  Visit date not found  No orders of the defined types were placed in this encounter.  No orders of the defined types were placed in this encounter.     I reviewed the patients updated PMH, FH, and SocHx.    Patient Active Problem List   Diagnosis Date Noted  . 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 Rotator cuff syndrome of right shoulder 11/17/2018  . Essential tremor 09/21/2018  . Laryngopharyngeal reflux (LPR) 09/01/2017  . Family history of Alzheimer's disease 10/30/2014  . GERD (gastroesophageal reflux disease) 07/25/2013  . Osteoporosis 06/17/2011   Current Meds  Medication Sig  . famotidine (PEPCID) 20  MG tablet One at bedtime  . VITAMIN D PO Take by mouth.    Allergies: Patient is allergic to codeine, hydrocodeine [dihydrocodeine], and fosamax [alendronate sodium]. Family History: Patient family history includes Alzheimer's disease in her maternal grandmother and mother; Arthritis in her brother and brother; Cancer in her father, maternal grandmother, and maternal uncle; Colon cancer in her maternal grandfather and maternal uncle;  Healthy in her daughter and son; Heart disease in her mother; Hyperlipidemia in her mother; Hypertension in her mother; Lung cancer in her maternal aunt; Pulmonary embolism in her brother and mother. Social History:  Patient  reports that she has never smoked. She has never used smokeless tobacco. She reports that she does not drink alcohol and does not use drugs.  Review of Systems: Constitutional: Negative for fever malaise or anorexia Cardiovascular: negative for chest pain Respiratory: negative for SOB or persistent cough Gastrointestinal: negative for abdominal pain  Objective  Vitals: BP 116/76   Pulse 89   Temp (!) 97.4 F (36.3 C) (Temporal)   Ht 5\' 6"  (1.676 m)   Wt 135 lb 3.2 oz (61.3 kg)   SpO2 97%   BMI 21.82 kg/m  General: no acute distress , A&Ox3 HEENT: PEERL, conjunctiva normal, neck is supple, no lymphadenopathy Cardiovascular:  RRR without murmur or gallop.  No chest wall pain, no lower extremity edema Respiratory:  Good breath sounds bilaterally, CTAB with normal respiratory effort Gastrointestinal: soft, flat abdomen, normal active bowel sounds, no palpable masses, no hepatosplenomegaly, no appreciated hernias Skin:  Warm, no rashes  No visits with results within 1 Day(s) from this visit.  Latest known visit with results is:  Office Visit on 03/05/2020  Component Date Value Ref Range Status  . Glucose, Bld 03/05/2020 92  65 - 99 mg/dL Final  . BUN 03/05/2020 19  7 - 25 mg/dL Final  . Creat 03/05/2020 0.73  0.60 - 0.93 mg/dL Final  . GFR, Est Non African American 03/05/2020 83  > OR = 60 mL/min/1.74m2 Final  . GFR, Est African American 03/05/2020 97  > OR = 60 mL/min/1.10m2 Final  . BUN/Creatinine Ratio 26/37/8588 NOT APPLICABLE  6 - 22 (calc) Final  . Sodium 03/05/2020 139  135 - 146 mmol/L Final  . Potassium 03/05/2020 3.9  3.5 - 5.3 mmol/L Final  . Chloride 03/05/2020 104  98 - 110 mmol/L Final  . CO2 03/05/2020 27  20 - 32 mmol/L Final  . Calcium  03/05/2020 9.6  8.6 - 10.4 mg/dL Final  . Total Protein 03/05/2020 7.1  6.1 - 8.1 g/dL Final  . Albumin 03/05/2020 4.5  3.6 - 5.1 g/dL Final  . Globulin 03/05/2020 2.6  1.9 - 3.7 g/dL (calc) Final  . AG Ratio 03/05/2020 1.7  1.0 - 2.5 (calc) Final  . Total Bilirubin 03/05/2020 0.6  0.2 - 1.2 mg/dL Final  . Alkaline phosphatase (APISO) 03/05/2020 97  37 - 153 U/L Final  . AST 03/05/2020 16  10 - 35 U/L Final  . ALT 03/05/2020 10  6 - 29 U/L Final  . WBC 03/05/2020 4.1  3.8 - 10.8 Thousand/uL Final  . RBC 03/05/2020 4.48  3.80 - 5.10 Million/uL Final  . Hemoglobin 03/05/2020 13.2  11.7 - 15.5 g/dL Final  . HCT 03/05/2020 40.2  35 - 45 % Final  . MCV 03/05/2020 89.7  80.0 - 100.0 fL Final  . MCH 03/05/2020 29.5  27.0 - 33.0 pg Final  . MCHC 03/05/2020 32.8  32.0 - 36.0 g/dL Final  .  RDW 03/05/2020 12.6  11.0 - 15.0 % Final  . Platelets 03/05/2020 228  140 - 400 Thousand/uL Final  . MPV 03/05/2020 11.2  7.5 - 12.5 fL Final  . Neutro Abs 03/05/2020 1,935  1,500 - 7,800 cells/uL Final  . Lymphs Abs 03/05/2020 1,509  850 - 3,900 cells/uL Final  . Absolute Monocytes 03/05/2020 455  200 - 950 cells/uL Final  . Eosinophils Absolute 03/05/2020 131  15.0 - 500.0 cells/uL Final  . Basophils Absolute 03/05/2020 70  0.0 - 200.0 cells/uL Final  . Neutrophils Relative % 03/05/2020 47.2  % Final  . Total Lymphocyte 03/05/2020 36.8  % Final  . Monocytes Relative 03/05/2020 11.1  % Final  . Eosinophils Relative 03/05/2020 3.2  % Final  . Basophils Relative 03/05/2020 1.7  % Final  . Cholesterol 03/05/2020 229* <200 mg/dL Final  . HDL 03/05/2020 66  > OR = 50 mg/dL Final  . Triglycerides 03/05/2020 95  <150 mg/dL Final  . LDL Cholesterol (Calc) 03/05/2020 143* mg/dL (calc) Final  . Total CHOL/HDL Ratio 03/05/2020 3.5  <5.0 (calc) Final  . Non-HDL Cholesterol (Calc) 03/05/2020 163* <130 mg/dL (calc) Final  . Vit D, 25-Hydroxy 03/05/2020 85  30 - 100 ng/mL Final      Commons side effects, risks,  benefits, and alternatives for medications and treatment plan prescribed today were discussed, and the patient expressed understanding of the given instructions. Patient is instructed to call or message via MyChart if he/she has any questions or concerns regarding our treatment plan. No barriers to understanding were identified. We discussed Red Flag symptoms and signs in detail. Patient expressed understanding regarding what to do in case of urgent or emergency type symptoms.   Medication list was reconciled, printed and provided to the patient in AVS. Patient instructions and summary information was reviewed with the patient as documented in the AVS. This note was prepared with assistance of Dragon voice recognition software. Occasional wrong-word or sound-a-like substitutions may have occurred due to the inherent limitations of voice recognition software  This visit occurred during the SARS-CoV-2 public health emergency.  Safety protocols were in place, including screening questions prior to the visit, additional usage of staff PPE, and extensive cleaning of exam room while observing appropriate contact time as indicated for disinfecting solutions.

## 2020-06-19 NOTE — Telephone Encounter (Signed)
Noted  

## 2020-06-26 ENCOUNTER — Telehealth: Payer: Self-pay

## 2020-06-26 NOTE — Telephone Encounter (Signed)
Patient is calling in asking if the results from the bone density scan will be mailed or if someone could call her with those results.

## 2020-06-27 ENCOUNTER — Telehealth: Payer: Self-pay

## 2020-06-27 NOTE — Telephone Encounter (Signed)
Gave the patient the number to the breast center for her to get her results.

## 2020-06-27 NOTE — Telephone Encounter (Signed)
Patient is calling in and asking about Prolia. She said she was suppose to hear something back in November and hasn't heard anything else back about it.

## 2020-06-27 NOTE — Telephone Encounter (Signed)
LMOVM that I am still waiting on PA from [facility] for Prolia

## 2020-07-18 ENCOUNTER — Other Ambulatory Visit: Payer: Self-pay

## 2020-07-18 ENCOUNTER — Ambulatory Visit (INDEPENDENT_AMBULATORY_CARE_PROVIDER_SITE_OTHER): Payer: Medicare Other

## 2020-07-18 DIAGNOSIS — M810 Age-related osteoporosis without current pathological fracture: Secondary | ICD-10-CM | POA: Diagnosis not present

## 2020-07-18 MED ORDER — DENOSUMAB 60 MG/ML ~~LOC~~ SOSY
60.0000 mg | PREFILLED_SYRINGE | Freq: Once | SUBCUTANEOUS | Status: AC
  Administered 2020-07-18: 60 mg via SUBCUTANEOUS

## 2020-07-18 NOTE — Telephone Encounter (Signed)
Please schedule patient for nurse visit for Prolia, no PA needed. I cannot verify what her OOP cost will be, she will have to contact insurance for an exact price Reference number [id number] ICD 10 81.1 CPT code 90383

## 2020-07-18 NOTE — Progress Notes (Signed)
Jenny [name] 70 yr old female presents to office today to start Prolia injections per Asencion Partridge, MD. Administered DENOSUMAB 60 mg/mL subcu right arm. Patient tolerated well.

## 2020-07-18 NOTE — Telephone Encounter (Signed)
Pt is following up on this. She called her insurance and she believes they told her that the cost would be $100 but she was unsure. She is asking if we can double check. If this is the case, she would like to go ahead and get on the schedule.

## 2020-07-23 ENCOUNTER — Ambulatory Visit: Payer: Medicare Other | Admitting: Family Medicine

## 2020-08-06 ENCOUNTER — Other Ambulatory Visit: Payer: Self-pay

## 2020-08-06 ENCOUNTER — Other Ambulatory Visit: Payer: Medicare Other

## 2020-08-06 ENCOUNTER — Ambulatory Visit
Admission: RE | Admit: 2020-08-06 | Discharge: 2020-08-06 | Disposition: A | Discharge status: Home / Self Care | Payer: Medicare Other | Source: Ambulatory Visit | Attending: Family Medicine | Admitting: Family Medicine

## 2020-08-06 DIAGNOSIS — Z1231 Encounter for screening mammogram for malignant neoplasm of breast: Secondary | ICD-10-CM | POA: Diagnosis not present

## 2020-08-20 ENCOUNTER — Telehealth: Payer: Self-pay

## 2020-08-20 NOTE — Telephone Encounter (Signed)
Patient is calling in asking why she was not notified that her Prolia injections would have been cheaper to go through [facility]. As she was billed for $214.28.

## 2020-08-30 NOTE — Telephone Encounter (Signed)
Pt is following up on this 

## 2020-08-31 NOTE — Telephone Encounter (Signed)
You are right, that type of medication changes from insurance plan to another, patient should contact her insurance and get more information about the OTP cost for her medication and if there is any co-pay. The summary of benefit give [place] an estimate, but not specific that we will know one pharmacy will charge less than another.   Please give pt a call and explain this to her.   Thanks  [employer]

## 2020-08-31 NOTE — Telephone Encounter (Signed)
Do I need to call patient? There is no way for me to know this information. All I can tell a patient is that insurance has approved a PA and tell patient to call insurance for total cost. I have absolutely no way of learning each and every insurance plan's copay amounts for Prolia.Marland KitchenMarland Kitchen

## 2021-01-10 ENCOUNTER — Telehealth: Payer: Self-pay

## 2021-01-10 NOTE — Telephone Encounter (Signed)
Patient called back requesting the Prolia shots to be sent to [place], [place] - [id number] [place] [address] [place] [address]

## 2021-01-15 ENCOUNTER — Other Ambulatory Visit: Payer: Self-pay

## 2021-01-15 MED ORDER — DENOSUMAB 60 MG/ML ~~LOC~~ SOSY: 60.0000 mg | PREFILLED_SYRINGE | Freq: Once | SUBCUTANEOUS | 0 refills | Status: AC

## 2021-01-15 NOTE — Telephone Encounter (Signed)
Prolia script sent to pharmacy

## 2021-01-16 ENCOUNTER — Ambulatory Visit: Payer: Medicare Other

## 2021-01-17 ENCOUNTER — Telehealth: Payer: Self-pay

## 2021-01-17 NOTE — Telephone Encounter (Signed)
Pharmacy at the [facility] in [place] called and stated that they could not fill the prolia injections because it was prescribed as a 30 day supply. The pharmacist stated that Dr Jonni Sanger needs to rewrite the prescription as an 180 day supply in order for the pharmacy to fill the prescription

## 2021-01-21 ENCOUNTER — Other Ambulatory Visit: Payer: Self-pay

## 2021-01-21 MED ORDER — DENOSUMAB 60 MG/ML ~~LOC~~ SOSY: 60.0000 mg | PREFILLED_SYRINGE | SUBCUTANEOUS | 0 refills | Status: DC

## 2021-01-21 NOTE — Telephone Encounter (Signed)
Script resent for once every 6 months

## 2021-02-17 IMAGING — MG DIGITAL SCREENING BILAT W/ TOMO W/ CAD
8 series · 8 of 24 positions shown · non-contrast
Comparison: Previous exam(s).

CLINICAL DATA: Screening.

EXAM:
DIGITAL SCREENING BILATERAL MAMMOGRAM WITH TOMO AND CAD

[R MLO synth-2D]
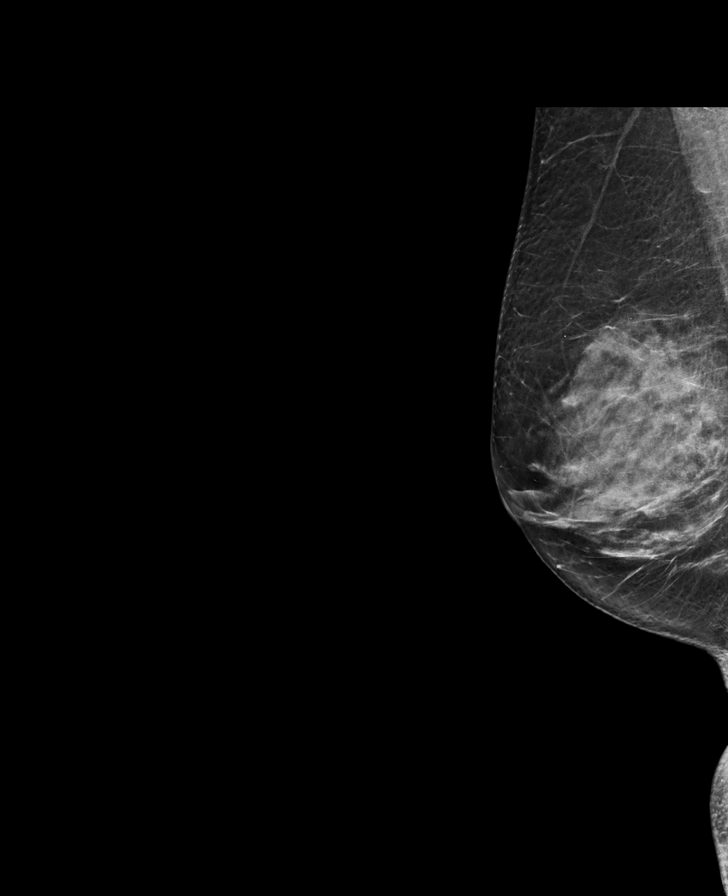

[L MLO synth-2D]
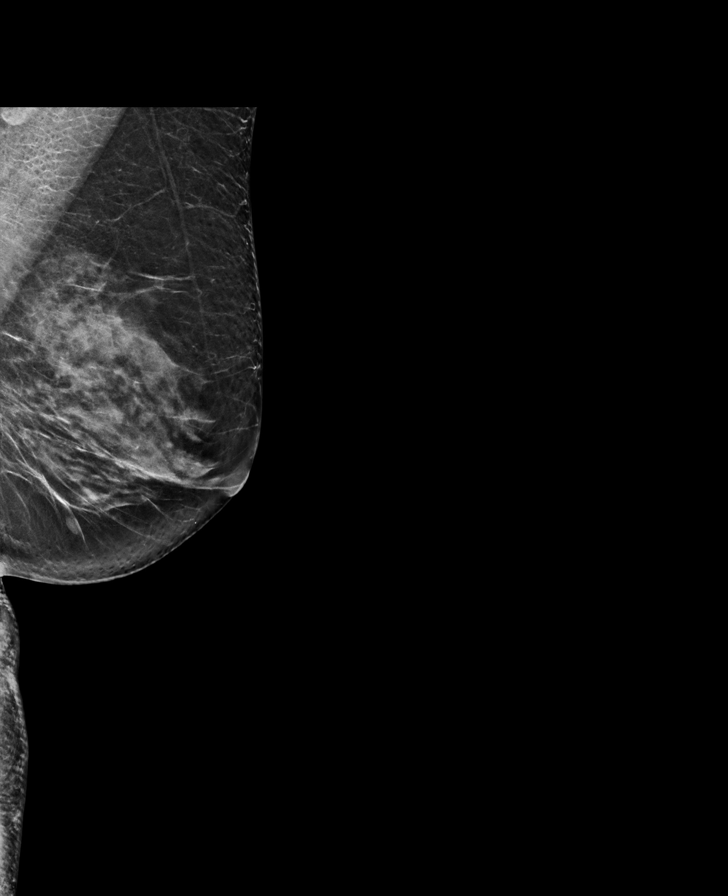

[R CC synth-2D]
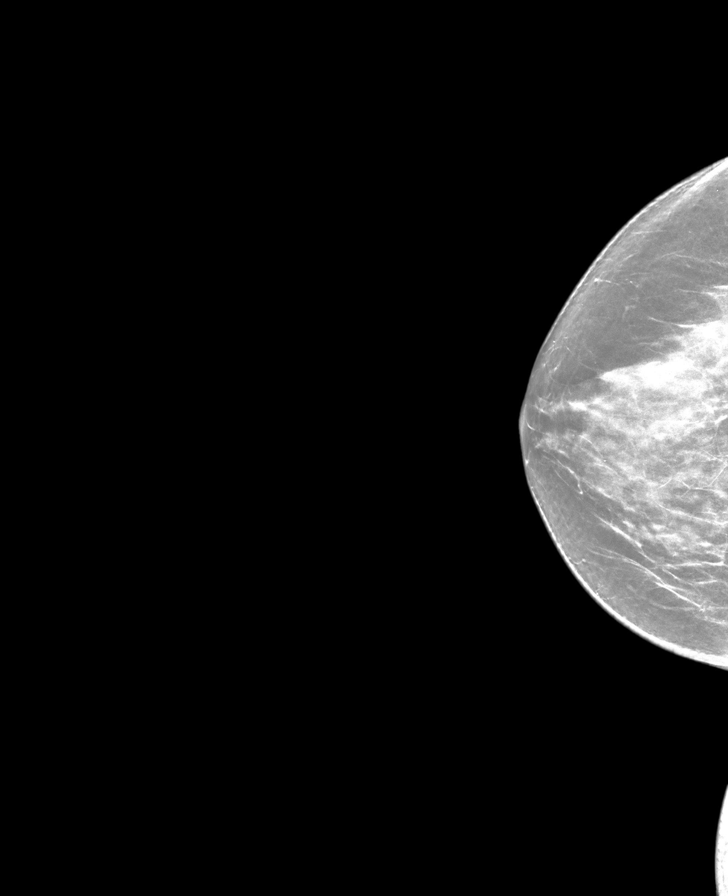

[L CC synth-2D]
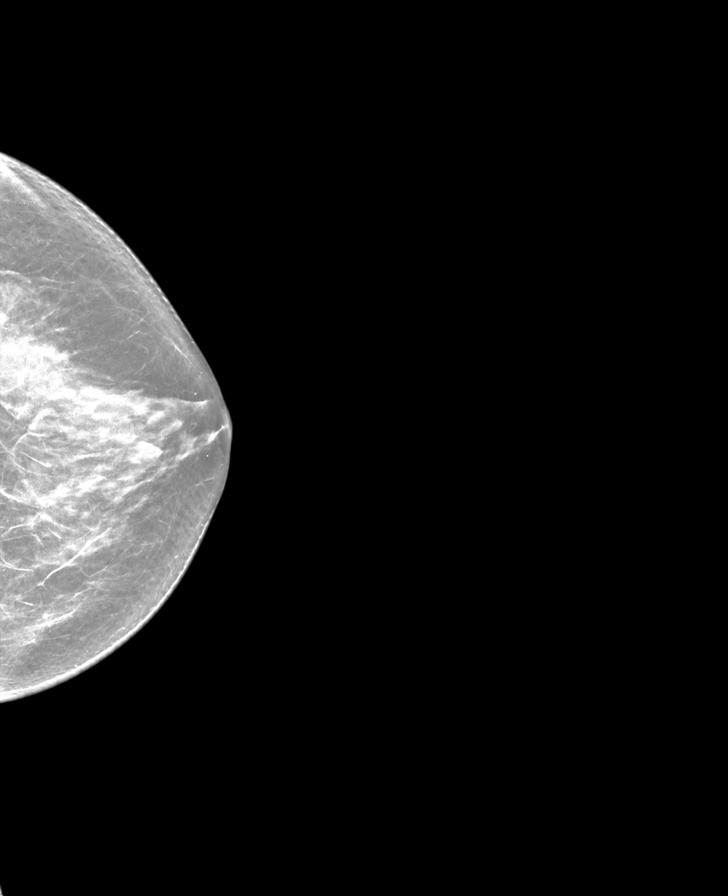

[R MLO tomo · tomo slice 33/64.0]
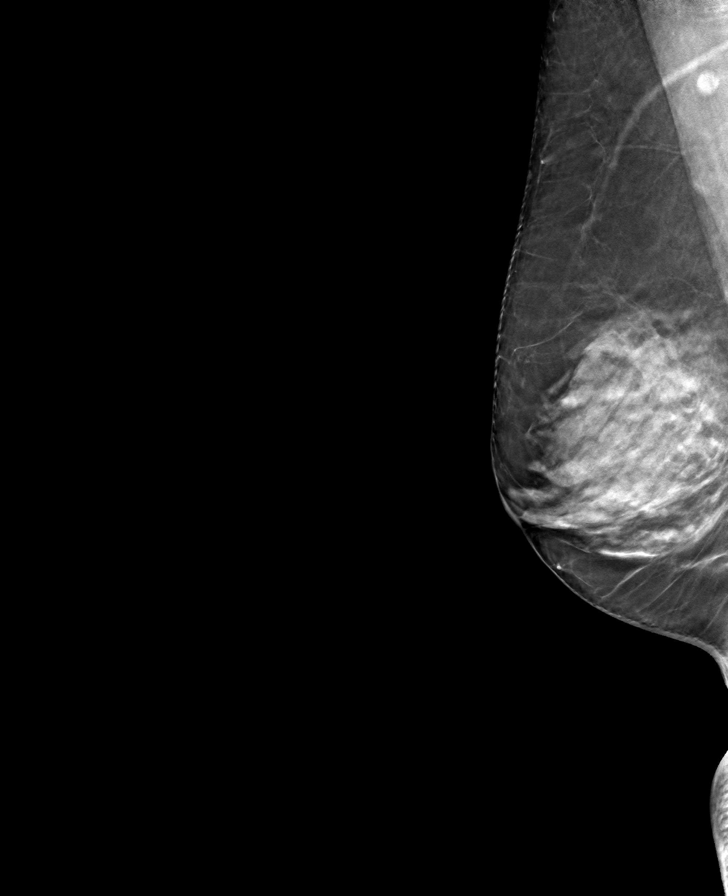

[R CC tomo · tomo slice 33/64.0]
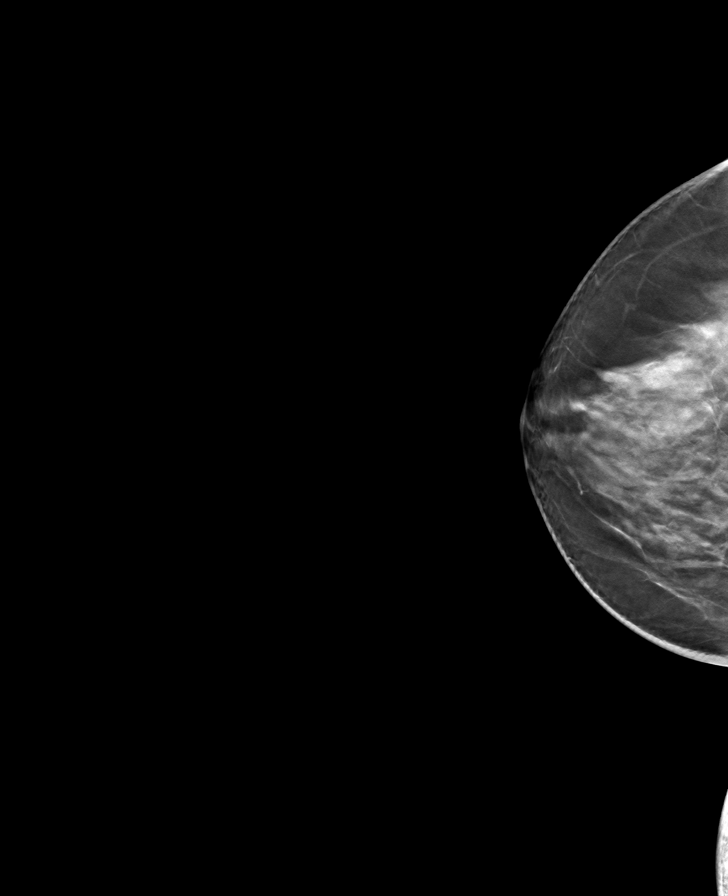

[L CC tomo · tomo slice 32/63.0]
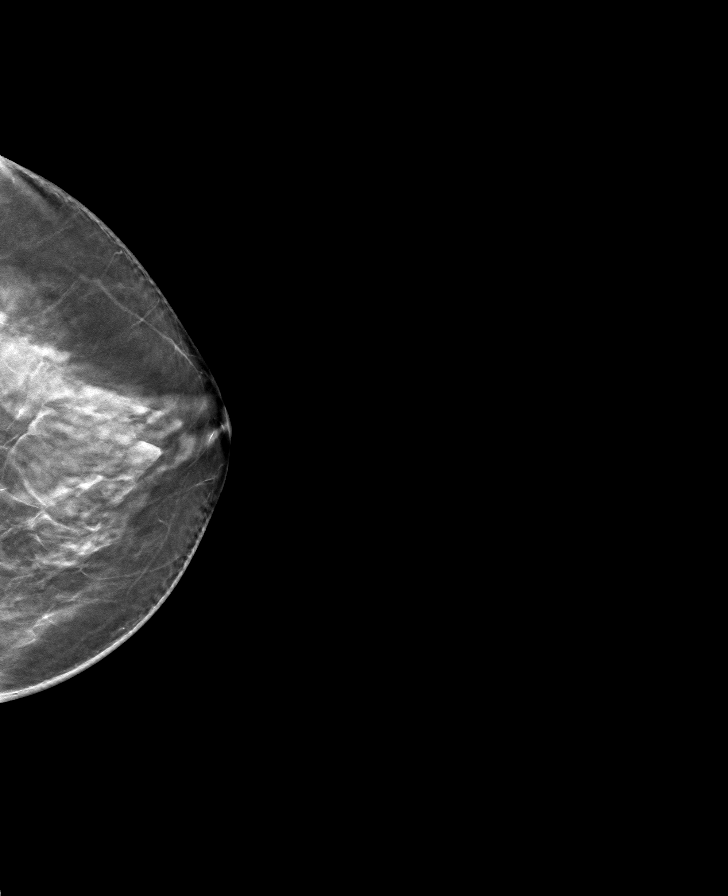

[L MLO tomo · tomo slice 35/69.0]
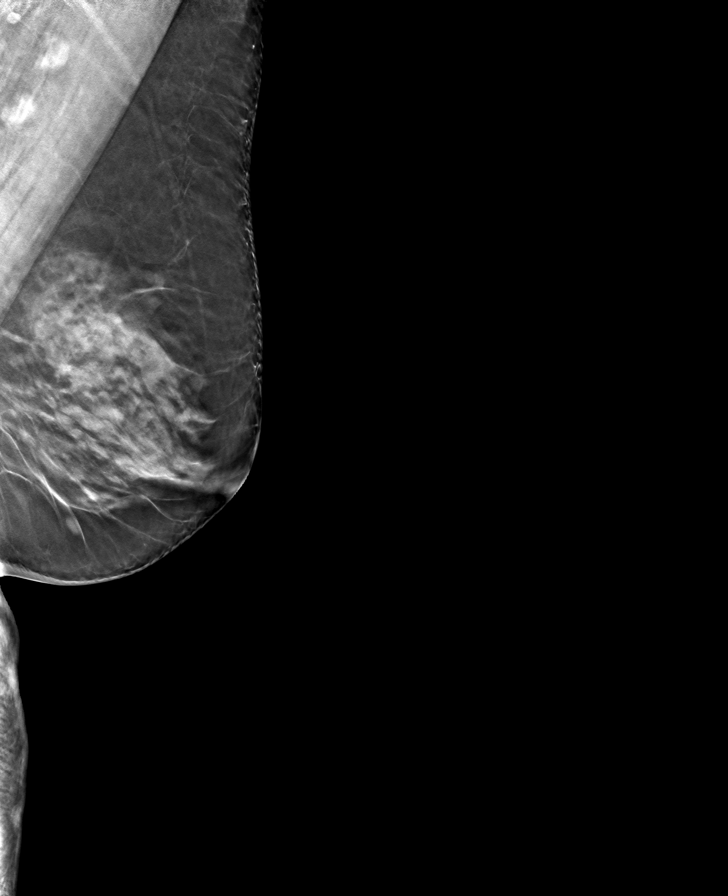

[8 of 24 positions shown; findings below may reference images not displayed]

ACR Breast Density Category c: The breast tissue is heterogeneously
dense, which may obscure small masses.
FINDINGS: There are no findings suspicious for malignancy. Images were
processed with CAD.
IMPRESSION: No mammographic evidence of malignancy. A result letter of this
screening mammogram will be mailed directly to the patient.

RECOMMENDATION:
Screening mammogram in one year. (Code:[id number])

BI-RADS CATEGORY  1: Negative.

## 2021-03-06 ENCOUNTER — Encounter: Payer: Medicare Other | Admitting: Family Medicine

## 2021-04-09 ENCOUNTER — Encounter: Payer: Self-pay | Admitting: Family Medicine

## 2021-04-09 ENCOUNTER — Other Ambulatory Visit: Payer: Self-pay

## 2021-04-09 ENCOUNTER — Ambulatory Visit (INDEPENDENT_AMBULATORY_CARE_PROVIDER_SITE_OTHER): Payer: Medicare Other | Admitting: Family Medicine

## 2021-04-09 VITALS — BP 124/80 | HR 64 | Temp 98.0°F | Ht 66.0 in | Wt 139.4 lb

## 2021-04-09 DIAGNOSIS — G44209 Tension-type headache, unspecified, not intractable: Secondary | ICD-10-CM

## 2021-04-09 DIAGNOSIS — Z23 Encounter for immunization: Secondary | ICD-10-CM | POA: Diagnosis not present

## 2021-04-09 DIAGNOSIS — M810 Age-related osteoporosis without current pathological fracture: Secondary | ICD-10-CM

## 2021-04-09 DIAGNOSIS — Z0000 Encounter for general adult medical examination without abnormal findings: Secondary | ICD-10-CM

## 2021-04-09 DIAGNOSIS — F4322 Adjustment disorder with anxiety: Secondary | ICD-10-CM

## 2021-04-09 DIAGNOSIS — G250 Essential tremor: Secondary | ICD-10-CM

## 2021-04-09 DIAGNOSIS — R011 Cardiac murmur, unspecified: Secondary | ICD-10-CM | POA: Insufficient documentation

## 2021-04-09 DIAGNOSIS — K219 Gastro-esophageal reflux disease without esophagitis: Secondary | ICD-10-CM | POA: Diagnosis not present

## 2021-04-09 DIAGNOSIS — M7501 Adhesive capsulitis of right shoulder: Secondary | ICD-10-CM | POA: Diagnosis not present

## 2021-04-09 LAB — CBC WITH DIFFERENTIAL/PLATELET
Basophils Absolute: 0.1 10*3/uL (ref 0.0–0.1)
Basophils Relative: 1.5 % (ref 0.0–3.0)
Eosinophils Absolute: 0.1 10*3/uL (ref 0.0–0.7)
Eosinophils Relative: 2.5 % (ref 0.0–5.0)
HCT: 40.5 % (ref 36.0–46.0)
Hemoglobin: 13.4 g/dL (ref 12.0–15.0)
Lymphocytes Relative: 36.2 % (ref 12.0–46.0)
Lymphs Abs: 1.4 10*3/uL (ref 0.7–4.0)
MCHC: 33.2 g/dL (ref 30.0–36.0)
MCV: 89.2 fl (ref 78.0–100.0)
Monocytes Absolute: 0.5 10*3/uL (ref 0.1–1.0)
Monocytes Relative: 11.9 % (ref 3.0–12.0)
Neutro Abs: 1.9 10*3/uL (ref 1.4–7.7)
Neutrophils Relative %: 47.9 % (ref 43.0–77.0)
Platelets: 203 10*3/uL (ref 150.0–400.0)
RBC: 4.54 Mil/uL (ref 3.87–5.11)
RDW: 13.4 % (ref 11.5–15.5)
WBC: 3.9 10*3/uL — ABNORMAL LOW (ref 4.0–10.5)

## 2021-04-09 LAB — COMPREHENSIVE METABOLIC PANEL
ALT: 19 U/L (ref 0–35)
AST: 21 U/L (ref 0–37)
Albumin: 4.7 g/dL (ref 3.5–5.2)
Alkaline Phosphatase: 70 U/L (ref 39–117)
BUN: 17 mg/dL (ref 6–23)
CO2: 27 mEq/L (ref 19–32)
Calcium: 9.9 mg/dL (ref 8.4–10.5)
Chloride: 105 mEq/L (ref 96–112)
Creatinine, Ser: 0.76 mg/dL (ref 0.40–1.20)
GFR: 78.74 mL/min (ref >60.00)
Glucose, Bld: 83 mg/dL (ref 70–99)
Potassium: 4.8 mEq/L (ref 3.5–5.1)
Sodium: 139 mEq/L (ref 135–145)
Total Bilirubin: 0.5 mg/dL (ref 0.2–1.2)
Total Protein: 7.4 g/dL (ref 6.0–8.3)

## 2021-04-09 LAB — LIPID PANEL
Cholesterol: 224 mg/dL — ABNORMAL HIGH (ref 0–200)
HDL: 63.8 mg/dL (ref >39.00)
LDL Cholesterol: 136 mg/dL — ABNORMAL HIGH (ref 0–99)
NonHDL: 160.35
Total CHOL/HDL Ratio: 4
Triglycerides: 121 mg/dL (ref 0.0–149.0)
VLDL: 24.2 mg/dL (ref 0.0–40.0)

## 2021-04-09 LAB — VITAMIN D 25 HYDROXY (VIT D DEFICIENCY, FRACTURES): VITD: 64.75 ng/mL (ref 30.00–100.00)

## 2021-04-09 LAB — TSH: TSH: 3.27 u[IU]/mL (ref 0.35–5.50)

## 2021-04-09 MED ORDER — CYCLOBENZAPRINE HCL 5 MG PO TABS: 5.0000 mg | ORAL_TABLET | Freq: Every evening | ORAL | 2 refills | Status: DC | PRN

## 2021-04-09 MED ORDER — SHINGRIX 50 MCG/0.5ML IM SUSR: 0.5000 mL | Freq: Once | INTRAMUSCULAR | 0 refills | Status: AC

## 2021-04-09 MED ORDER — DENOSUMAB 60 MG/ML ~~LOC~~ SOSY
60.0000 mg | PREFILLED_SYRINGE | Freq: Once | SUBCUTANEOUS | Status: AC
  Administered 2021-04-09: 60 mg via SUBCUTANEOUS

## 2021-04-09 NOTE — Patient Instructions (Signed)
Please return in 6 months for your next prolia injection, nurse visit. Can scheduel your next physical for September 2023   I will release your lab results to you on your MyChart account with further instructions. Please reply with any questions.    Today you were given your flu vaccination.    I wish you and your husband well. Let me know if you need more help with stress management or mood. You may use the muscle relaxer as needed at night.  If you have any questions or concerns, please don't hesitate to send me a message via MyChart or call the office at [phone]. Thank you for visiting with [place] today! It's our pleasure caring for you.   Stress, Adult Stress is a normal reaction to life events. Stress is what you feel when life demands more than you are used to, or more than you think you can handle. Some stress can be useful, such as studying for a test or meeting a deadline at work. Stress that occurs too often or for too long can cause problems. It can affect your emotional health and interfere with relationships and normal daily activities. Too much stress can weaken your body's defense system (immune system) and increase your risk for physical illness. If you already have a medical problem, stress can make it worse. What are the causes? All sorts of life events can cause stress. An event that causes stress for one person may not be stressful for another person. Major life events, whether positive or negative, commonly cause stress. Examples include: Losing a job or starting a new job. Losing a loved one. Moving to a new town or home. Getting married or divorced. Having a baby. Getting injured or sick. Less obvious life events can also cause stress, especially if they occur day after day or in combination with each other. Examples include: Working long hours. Driving in traffic. Caring for children. Being in debt. Being in a difficult relationship. What are the signs or  symptoms? Stress can cause emotional symptoms, including: Anxiety. This is feeling worried, afraid, on edge, overwhelmed, or out of control. Anger, including irritation or impatience. Depression. This is feeling sad, down, helpless, or guilty. Trouble focusing, remembering, or making decisions. Stress can cause physical symptoms, including: Aches and pains. These may affect your head, neck, back, stomach, or other areas of your body. Tight muscles or a clenched jaw. Low energy. Trouble sleeping. Stress can cause unhealthy behaviors, including: Eating to feel better (overeating) or skipping meals. Working too much or putting off tasks. Smoking, drinking alcohol, or using drugs to feel better. How is this diagnosed? Stress is diagnosed through an assessment by your health care provider. He or she may diagnose this condition based on: Your symptoms and any stressful life events. Your medical history. Tests to rule out other causes of your symptoms. Depending on your condition, your health care provider may refer you to a specialist for further evaluation. How is this treated? Stress management techniques are the recommended treatment for stress. Medicine is not typically recommended for the treatment of stress. Techniques to reduce your reaction to stressful life events include: Stress identification. Monitor yourself for symptoms of stress and identify what causes stress for you. These skills may help you to avoid or prepare for stressful events. Time management. Set your priorities, keep a calendar of events, and learn to say no. Taking these actions can help you avoid making too many commitments. Techniques for coping with stress include: Rethinking  the problem. Try to think realistically about stressful events rather than ignoring them or overreacting. Try to find the positives in a stressful situation rather than focusing on the negatives. Exercise. Physical exercise can release both  physical and emotional tension. The key is to find a form of exercise that you enjoy and do it regularly. Relaxation techniques. These relax the body and mind. The key is to find one or more that you enjoy and use the techniques regularly. Examples include: Meditation, deep breathing, or progressive relaxation techniques. Yoga or tai chi. Biofeedback, mindfulness techniques, or journaling. Listening to music, being out in nature, or participating in other hobbies. Practicing a healthy lifestyle. Eat a balanced diet, drink plenty of water, limit or avoid caffeine, and get plenty of sleep. Having a strong support network. Spend time with family, friends, or other people you enjoy being around. Express your feelings and talk things over with someone you trust. Counseling or talk therapy with a mental health professional may be helpful if you are having trouble managing stress on your own. Follow these instructions at home: Lifestyle  Avoid drugs. Do not use any products that contain nicotine or tobacco, such as cigarettes, e-cigarettes, and chewing tobacco. If you need help quitting, ask your health care provider. Limit alcohol intake to no more than 1 drink a day for nonpregnant women and 2 drinks a day for men. One drink equals 12 oz of beer, 5 oz of wine, or 1 oz of hard liquor Do not use alcohol or drugs to relax. Eat a balanced diet that includes fresh fruits and vegetables, whole grains, lean meats, fish, eggs, and beans, and low-fat dairy. Avoid processed foods and foods high in added fat, sugar, and salt. Exercise at least 30 minutes on 5 or more days each week. Get 7-8 hours of sleep each night. General instructions  Practice stress management techniques as discussed with your health care provider. Drink enough fluid to keep your urine clear or pale yellow. Take over-the-counter and prescription medicines only as told by your health care provider. Keep all follow-up visits as told by  your health care provider. This is important. Contact a health care provider if: Your symptoms get worse. You have new symptoms. You feel overwhelmed by your problems and can no longer manage them on your own. Get help right away if: You have thoughts of hurting yourself or others. If you ever feel like you may hurt yourself or others, or have thoughts about taking your own life, get help right away. You can go to your nearest emergency department or call: Your local emergency services (911 in the [place] A suicide crisis helpline, such as the [facility] at 302-264-3981. This is open 24 hours a day. Summary Stress is a normal reaction to life events. It can cause problems if it happens too often or for too long. Practicing stress management techniques is the best way to treat stress. Counseling or talk therapy with a mental health professional may be helpful if you are having trouble managing stress on your own. This information is not intended to replace advice given to you by your health care provider. Make sure you discuss any questions you have with your health care provider. Document Revised: 09/07/2020 Document Reviewed: 03/16/2020 Elsevier Patient Education  2022 [employer].

## 2021-04-09 NOTE — Progress Notes (Signed)
Subjective  Chief Complaint  Patient presents with   Annual Exam    Not fasting   Gastroesophageal Reflux    HPI: Jenny [name] is a 70 y.o. female who presents to [facility] today for a Female Wellness Visit. She also has the concerns and/or needs as listed above in the chief complaint. These will be addressed in addition to the Health Maintenance Visit.   Wellness Visit: annual visit with health maintenance review and exam without Pap  HM: screens are up to date. Eligible for flu and shingrix and covid 4th booster. Healthy lifestyle.  Chronic disease f/u and/or acute problem visit: (deemed necessary to be done in addition to the wellness visit): Osteoporosis: had 1st prolia injection in jan 2022; tolerated well. Since had confusion w/ cost and pharmacy coverage.on calcium and d GERD/LPR: stable no new sxs Adjustment reaction: husband has aggressive bladder cancer; they are facing the future with close long term monitoring and treatment. This has affected her mood: sleep is worse, teeth clenching and daily headaches. No longer with chest pain sxs.  Right frozen shoulder: much improved ROM and no pain now   Assessment  1. Annual physical exam   2. Age-related osteoporosis without current pathological fracture   3. Gastroesophageal reflux disease without esophagitis   4. Essential tremor   5. Laryngopharyngeal reflux (LPR)   6. Tension headache   7. Need for immunization against influenza   8. Adjustment disorder with anxious mood      Plan  Female Wellness Visit: Age appropriate Health Maintenance and Prevention measures were discussed with patient. Included topics are cancer screening recommendations, ways to keep healthy (see AVS) including dietary and exercise recommendations, regular eye and dental care, use of seat belts, and avoidance of moderate alcohol use and tobacco use.  BMI: discussed patient's BMI and encouraged positive lifestyle  modifications to help get to or maintain a target BMI. HM needs and immunizations were addressed and ordered. See below for orders. See HM and immunization section for updates. Flu today; RX for shingrix given Routine labs and screening tests ordered including cmp, cbc and lipids where appropriate. Discussed recommendations regarding Vit D and calcium supplementation (see AVS)  Chronic disease management visit and/or acute problem visit: Osteoporosis: discussed indications and benefits of prolia: discussed cost. 2nd dose given today. Repeat in 6 months for 2 years. Then repeat dexa.  Gerd/la: stabe on h2 blocker Adjusment reaction: counseling done. Flexeril for tension headaches and clenching as needed. Discussed use of antidepressants if worsens. See AVS. Rec exercise.  Frozen shoulder: improved.   Follow up: Return in about 1 year (around 04/09/2022) for complete physical, 6 months for next prolia injection.  Orders Placed This Encounter  Procedures   Flu Vaccine QUAD High Dose(Fluad)   CBC with Differential/Platelet   Comprehensive metabolic panel   Lipid panel   TSH   VITAMIN D 25 Hydroxy (Vit-D Deficiency, Fractures)   Meds ordered this encounter  Medications   Zoster Vaccine Adjuvanted [facility]) injection    Sig: Inject 0.5 mLs into the muscle once for 1 dose. Please give 2nd dose 2-6 months after first dose    Dispense:  2 each    Refill:  0   cyclobenzaprine (FLEXERIL) 5 MG tablet    Sig: Take 1 tablet (5 mg total) by mouth at bedtime as needed for muscle spasms.    Dispense:  30 tablet    Refill:  2   denosumab (PROLIA) injection  60 mg    Order Specific Question:   Patient is enrolled in [facility] program for this medication and I have provided a copy of the Prolia Medication Guide and Patient Brochure.    Answer:   Yes    Order Specific Question:   I have reviewed with the patient the information in the Prolia Medication Guide and Patient Counseling Chart including the serious  risks of Prolia and symptoms of each risk.    Answer:   Yes    Order Specific Question:   I have advised the patient to seek medical attention if they have signs or symptoms of any of the serious risks.    Answer:   Yes       Body mass index is 22.5 kg/m. 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 Modena Jansky in [place] 2019: normal results    Osteoporosis 06/17/2011    By dexa in 2009  -2.8 ls spine needs fu.  Apparently had se of fosmax in the past. Dexa 05/2020 lowest T = -3.3, Lspine; osteoporosis, needs treatment. Failed fosamax. Will rec prolia, vit d and calcium    Health Maintenance  Topic Date Due   COVID-19 Vaccine (3 - Booster for Pfizer series) 02/21/2020   MAMMOGRAM  08/06/2021   DEXA SCAN  06/12/2022   COLONOSCOPY (Pts 45-81yrs Insurance coverage will need to be confirmed)  07/15/2027   TETANUS/TDAP  09/19/2027   INFLUENZA VACCINE  Completed   Hepatitis C Screening  Completed   Zoster Vaccines- Shingrix  Completed   HPV VACCINES  Aged Out   Immunization History  Administered Date(s) Administered   Fluad Quad(high Dose 65+) 03/20/2020, 04/09/2021   Influenza Split 04/22/2012, 04/24/2013   Influenza Whole 04/13/2009   Influenza, High Dose Seasonal PF 04/13/2017, 03/28/2018, 03/28/2019   Influenza-Unspecified 05/03/2014, 04/17/2016   PFIZER(Purple Top)SARS-COV-2 Vaccination 08/27/2019, 09/21/2019   Pneumococcal Conjugate-13 10/30/2014   Pneumococcal Polysaccharide-23 09/01/2017   Td  07/14/2006   Tdap 09/18/2017   Zoster Recombinat (Shingrix) 01/27/2018, 05/12/2018   Zoster, Live 08/13/2012   We updated and reviewed the patient's past history in detail and it is documented below. Allergies: Patient is allergic to codeine, hydrocodeine [dihydrocodeine], and fosamax [alendronate sodium]. Past Medical History Patient  has a past medical history of Essential tremor (09/21/2018), GERD (gastroesophageal reflux disease), Laryngopharyngeal reflux (LPR) (09/01/2017), and Osteoporosis. Past Surgical History Patient  has a past surgical history that includes Tonsillectomy and Colonoscopy. Family History: Patient family history includes Alzheimer's disease in her maternal grandmother and mother; Arthritis in her brother and brother; Cancer in her father, maternal grandmother, and maternal uncle; Colon cancer in her maternal grandfather and maternal uncle; Healthy in her daughter and son; Heart disease in her mother; Hyperlipidemia in her mother; Hypertension in her mother; Lung cancer in her maternal aunt; Pulmonary embolism in her brother and mother. Social History:  Patient  reports that she has never smoked. She has never used smokeless tobacco. She reports that she  does not drink alcohol and does not use drugs.  Review of Systems: Constitutional: negative for fever or malaise Ophthalmic: negative for photophobia, double vision or loss of vision Cardiovascular: negative for chest pain, dyspnea on exertion, or new LE swelling Respiratory: negative for SOB or persistent cough Gastrointestinal: negative for abdominal pain, change in bowel habits or melena Genitourinary: negative for dysuria or gross hematuria, no abnormal uterine bleeding or disharge Musculoskeletal: negative for new gait disturbance or muscular weakness Integumentary: negative for new or persistent rashes, no breast lumps Neurological: negative for TIA or stroke symptoms Psychiatric: negative for SI or  delusions Allergic/Immunologic: negative for hives  Patient Care Team    Relationship Specialty Notifications Start End  Leamon Arnt, MD PCP - General Family Medicine  09/01/17   Ladene Artist, MD  Gastroenterology  06/16/11    Comment: ? vs Bronwen Betters, MD Consulting Physician Pulmonary Disease  09/01/17     Objective  Vitals: BP 124/80   Pulse 64   Temp 98 F (36.7 C) (Temporal)   Ht 5\' 6"  (1.676 m)   Wt 139 lb 6.4 oz (63.2 kg)   SpO2 95%   BMI 22.50 kg/m  General:  Well developed, well nourished, no acute distress  Psych:  Alert and orientedx3,normal mood and affect HEENT:  Normocephalic, atraumatic, non-icteric sclera,  supple neck without adenopathy, mass or thyromegaly Cardiovascular:  Normal S1, S2, RRR without gallop, rub + murmur Respiratory:  Good breath sounds bilaterally, CTAB with normal respiratory effort Gastrointestinal: normal bowel sounds, soft, non-tender, no noted masses. No HSM MSK: no deformities, contusions. Joints are without erythema or swelling. Right shoulder with abduction almost to full Skin:  Warm, no rashes or suspicious lesions noted Neurologic:    Mental status is normal. Gross motor and sensory exams are normal. Normal gait.    Commons side effects, risks, benefits, and alternatives for medications and treatment plan prescribed today were discussed, and the patient expressed understanding of the given instructions. Patient is instructed to call or message via MyChart if he/she has any questions or concerns regarding our treatment plan. No barriers to understanding were identified. We discussed Red Flag symptoms and signs in detail. Patient expressed understanding regarding what to do in case of urgent or emergency type symptoms.  Medication list was reconciled, printed and provided to the patient in AVS. Patient instructions and summary information was reviewed with the patient as documented in the AVS. This note was prepared with  assistance of Dragon voice recognition software. Occasional wrong-word or sound-a-like substitutions may have occurred due to the inherent limitations of voice recognition software  This visit occurred during the SARS-CoV-2 public health emergency.  Safety protocols were in place, including screening questions prior to the visit, additional usage of staff PPE, and extensive cleaning of exam room while observing appropriate contact time as indicated for disinfecting solutions.

## 2021-04-11 ENCOUNTER — Encounter: Payer: Self-pay | Admitting: Family Medicine

## 2021-04-11 DIAGNOSIS — E782 Mixed hyperlipidemia: Secondary | ICD-10-CM | POA: Insufficient documentation

## 2021-04-11 HISTORY — DX: Mixed hyperlipidemia: E78.2

## 2021-04-11 NOTE — Progress Notes (Signed)
Please call patient: I have reviewed his/her lab results. Results are all good except for cholesterol levels. They remain high and her overall risk for atherosclerotic disease is not elevated warranting a cholesterol lowering medication. I recommend starting crestor 10 nightly. All other labs are good. I would recommend an OV in 3 months to recheck these levels   The 10-year ASCVD risk score (Arnett DK, et al., 2019) is: 10%   Values used to calculate the score:     Age: 70 years     Sex: Female     Is Non-Hispanic African American: No     Diabetic: No     Tobacco smoker: No     Systolic Blood Pressure: 644 mmHg     Is BP treated: No     HDL Cholesterol: 63.8 mg/dL     Total Cholesterol: 224 mg/dL

## 2021-07-02 ENCOUNTER — Telehealth: Payer: Self-pay

## 2021-07-02 NOTE — Telephone Encounter (Signed)
[facility] called to state patient refused to go to ED.

## 2021-07-02 NOTE — Telephone Encounter (Signed)
Called patient to advise her we needed to see her ASAP, however I was not able to connect with her. Left a voicemail to call our office as soon as possible.

## 2021-07-02 NOTE — Telephone Encounter (Signed)
Please advise, PCP out of office for the day. Patient hesitant to go to ED

## 2021-07-02 NOTE — Telephone Encounter (Signed)
Please schedule appt ASAP per Dr. Jerline Pain.

## 2021-07-02 NOTE — Telephone Encounter (Signed)
atient Name: Jenny [name] [facility] Gender: Female DOB: Jan 11, 1951 Age: 70 Y 9 M 28 D Return Phone Number: [phone] (Primary) Address: City/ State/ Zip: [place]  [address] Client [place] [facility] [place] Client Site [place] [facility] [place] [facility] Provider Billey Chang- MD Contact Type Call Who Is Calling Patient / Member / Family / Caregiver Call Type Triage / Clinical Caller Name Anabela Crayton T Relationship To Patient Provider Return Phone Number [phone] (Primary) Chief Complaint NUMBNESS/TINGLING- sudden on one side of the body or face Reason for Call Symptomatic / Request for [place] states she has a patient on the other line that needs to get triaged. Patient is having numbness and tingling on her right side. Blue ball on the top of foot that she thinks may be a blood clot. [place] Not Immunologist Translation No Nurse Assessment Nurse: Radford Pax, RN, Eugene Garnet Date/Time Eilene Ghazi Time): 07/02/2021 10:26:58 AM Confirm and document reason for call. If symptomatic, describe symptoms. ---Patient is having numbness and tingling on her right side for the last week. Numbness is gone, but still having tingling. Did have a blue ball on top of foot that she was concerned was a clot. Does the patient have any new or worsening symptoms? ---Yes Will a triage be completed? ---Yes Related visit to physician within the last 2 weeks? ---No Does the PT have any chronic conditions? (i.e. diabetes, asthma, this includes High risk factors for pregnancy, etc.) ---No Is this a behavioral health or substance abuse call? ---No Guidelines Guideline Title Affirmed Question Affirmed Notes Nurse Date/Time Eilene Ghazi Time) Neurologic Deficit Headache (and neurologic deficit) Radford Pax, RN, Eugene Garnet 07/02/2021 10:28:13 AM Disp. Time Eilene Ghazi Time) Disposition Final User 07/02/2021 10:25:03 AM Send to  Urgent Queue Olga Coaster 07/02/2021 10:32:19 AM Go to ED Now Yes Radford Pax, RN, Sharion Settler Disagree/Comply Comply Caller Understands Yes PreDisposition Call Doctor Care Advice Given Per Guideline GO TO ED NOW: * You need to be seen in the Emergency Department. CARE ADVICE given per Neurologic Deficit (Adult) guideline. Comments User: Susie Cassette, RN Date/Time Eilene Ghazi Time): 07/02/2021 10:34:00 AM Unsure if she will go to ED. She will talk to husband. Referrals GO TO FACILITY OTHER - SPECIFY

## 2021-07-02 NOTE — Telephone Encounter (Signed)
She needs to get evaluated ASAP. We can schedule her in our office with the earliest available appointment if she is refusing to go to the ED.  Algis Greenhouse. Jerline Pain, MD 07/02/2021 2:43 PM

## 2021-07-08 ENCOUNTER — Other Ambulatory Visit: Payer: Self-pay

## 2021-07-08 ENCOUNTER — Encounter (HOSPITAL_BASED_OUTPATIENT_CLINIC_OR_DEPARTMENT_OTHER): Payer: Self-pay | Admitting: Obstetrics and Gynecology

## 2021-07-08 ENCOUNTER — Telehealth: Payer: Medicare Other | Admitting: Nurse Practitioner

## 2021-07-08 ENCOUNTER — Emergency Department (HOSPITAL_BASED_OUTPATIENT_CLINIC_OR_DEPARTMENT_OTHER)
Admission: EM | Admit: 2021-07-08 | Discharge: 2021-07-08 | Disposition: A | Discharge status: Home / Self Care | Payer: Medicare Other | Attending: Emergency Medicine | Admitting: Emergency Medicine

## 2021-07-08 ENCOUNTER — Emergency Department (HOSPITAL_BASED_OUTPATIENT_CLINIC_OR_DEPARTMENT_OTHER): Payer: Medicare Other

## 2021-07-08 DIAGNOSIS — R519 Headache, unspecified: Secondary | ICD-10-CM | POA: Diagnosis not present

## 2021-07-08 DIAGNOSIS — M4802 Spinal stenosis, cervical region: Secondary | ICD-10-CM | POA: Diagnosis not present

## 2021-07-08 DIAGNOSIS — M5030 Other cervical disc degeneration, unspecified cervical region: Secondary | ICD-10-CM | POA: Diagnosis not present

## 2021-07-08 DIAGNOSIS — M5412 Radiculopathy, cervical region: Secondary | ICD-10-CM | POA: Diagnosis not present

## 2021-07-08 DIAGNOSIS — R202 Paresthesia of skin: Secondary | ICD-10-CM | POA: Diagnosis not present

## 2021-07-08 DIAGNOSIS — G8929 Other chronic pain: Secondary | ICD-10-CM | POA: Insufficient documentation

## 2021-07-08 DIAGNOSIS — R200 Anesthesia of skin: Secondary | ICD-10-CM

## 2021-07-08 LAB — CBC
HCT: 41.7 % (ref 36.0–46.0)
Hemoglobin: 13.5 g/dL (ref 12.0–15.0)
MCH: 29.5 pg (ref 26.0–34.0)
MCHC: 32.4 g/dL (ref 30.0–36.0)
MCV: 91 fL (ref 80.0–100.0)
Platelets: 222 10*3/uL (ref 150–400)
RBC: 4.58 MIL/uL (ref 3.87–5.11)
RDW: 12.5 % (ref 11.5–15.5)
WBC: 6.5 10*3/uL (ref 4.0–10.5)
nRBC: 0 % (ref 0.0–0.2)

## 2021-07-08 LAB — URINALYSIS, ROUTINE W REFLEX MICROSCOPIC
Bilirubin Urine: NEGATIVE
Glucose, UA: NEGATIVE mg/dL
Hgb urine dipstick: NEGATIVE
Ketones, ur: NEGATIVE mg/dL
Leukocytes,Ua: NEGATIVE
Nitrite: NEGATIVE
Protein, ur: NEGATIVE mg/dL
Specific Gravity, Urine: 1.01 (ref 1.005–1.030)
pH: 6.5 (ref 5.0–8.0)

## 2021-07-08 LAB — BASIC METABOLIC PANEL
Anion gap: 6 (ref 5–15)
BUN: 19 mg/dL (ref 8–23)
CO2: 31 mmol/L (ref 22–32)
Calcium: 9.8 mg/dL (ref 8.9–10.3)
Chloride: 103 mmol/L (ref 98–111)
Creatinine, Ser: 0.77 mg/dL (ref 0.44–1.00)
GFR, Estimated: >60 mL/min (ref >60)
Glucose, Bld: 91 mg/dL (ref 70–99)
Potassium: 4.7 mmol/L (ref 3.5–5.1)
Sodium: 140 mmol/L (ref 135–145)

## 2021-07-08 NOTE — ED Provider Notes (Signed)
[facility]Y DEPT Provider Note   CSN: [id number] Arrival date & time: 07/08/21  1257     History Chief Complaint  Patient presents with   Tingling    BLANKA ROCKHOLT is a 70 y.o. female who previously healthy female who presents for evaluation of right-sided unilateral tingling that has been going on for 1 week and worsening.  She states tingling has been constant, radiating down and involving her entire hand and all the way down to her right toes.  She describes the sensation as pins-and-needles.  Additionally, patient has chronic headaches nearly every other day that has never been worked up.  Headaches are bilateral across her forehead and she describes it as throbbing.  No auras.  She takes ibuprofen every few hours without any relief.  She denies numbness, unilateral weakness, slurred speech, vision changes and ataxia.  She initially attempted to make an appointment with her PCP for the symptoms, however they instructed her to come to the ED instead.  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   Rotator cuff syndrome of right shoulder 11/17/2018   Essential tremor 09/21/2018   Laryngopharyngeal reflux (LPR) 09/01/2017   Family history of Alzheimer's disease 10/30/2014   GERD (gastroesophageal reflux disease) 07/25/2013   Osteoporosis 06/17/2011    Past Surgical History:  Procedure Laterality Date   COLONOSCOPY     TONSILLECTOMY     age 62     OB History     Gravida  2   Para  2    Term  2   Preterm  0   AB  0   Living  2      SAB  0   IAB  0   Ectopic  0   Multiple  0   Live Births  2           Family History  Problem Relation Age of Onset   Hyperlipidemia Mother    Heart disease Mother    Hypertension Mother    Alzheimer's disease Mother    Pulmonary embolism Mother    Cancer Father        unsure of type   Pulmonary embolism Brother    Arthritis Brother    Arthritis Brother    Alzheimer's disease Maternal Grandmother    Cancer Maternal Grandmother    Healthy Daughter    Healthy Son    Lung cancer Maternal Aunt        non smoker   Cancer Maternal Uncle    Colon cancer Maternal Uncle    Colon cancer Maternal Grandfather     Social History   Tobacco Use   Smoking status: Never   Smokeless tobacco: Never  Vaping Use   Vaping Use: Never used  Substance Use Topics   Alcohol use: No    Alcohol/week: 0.0 standard drinks   Drug use: No    Home Medications Prior to Admission medications   Medication Sig Start Date End  Date Taking? Authorizing Provider  CALCIUM PO Take by mouth.    [provider]  cyclobenzaprine (FLEXERIL) 5 MG tablet Take 1 tablet (5 mg total) by mouth at bedtime as needed for muscle spasms. 04/09/21   Leamon Arnt, MD  denosumab (PROLIA) 60 MG/ML SOSY injection Inject 60 mg into the skin every 6 (six) months. Patient not taking: Reported on 04/09/2021 01/21/21   Leamon Arnt, MD  famotidine (PEPCID) 20 MG tablet One at bedtime 05/14/16   Tanda Rockers, MD  Multiple Vitamin (MULTIVITAMIN PO) Take 1 tablet by mouth daily. Centrum Silver    [provider]  VITAMIN D PO Take by mouth.    [provider]    Allergies    Codeine, Hydrocodeine [dihydrocodeine], and Fosamax [alendronate sodium]  Review of Systems   Review of Systems  Constitutional:  Negative for fever.  HENT: Negative.    Eyes: Negative.   Respiratory:  Negative for shortness of breath.   Cardiovascular:  Negative.   Gastrointestinal:  Negative for abdominal pain and vomiting.  Endocrine: Negative.   Genitourinary: Negative.   Musculoskeletal: Negative.   Skin:  Negative for rash.  Neurological:  Positive for headaches.       Right-sided tingling  All other systems reviewed and are negative.  Physical Exam Updated Vital Signs BP 124/79 (BP Location: Right Arm)    Pulse 100    Temp 97.7 F (36.5 C)    Resp 18    Ht 5\' 6"  (1.676 m)    Wt 61.7 kg    SpO2 98%    BMI 21.95 kg/m   Physical Exam Vitals and nursing note reviewed.  Constitutional:      General: She is not in acute distress.    Appearance: She is not ill-appearing.  HENT:     Head: Atraumatic.  Eyes:     Conjunctiva/sclera: Conjunctivae normal.  Cardiovascular:     Rate and Rhythm: Normal rate and regular rhythm.     Pulses: Normal pulses.     Heart sounds: No murmur heard. Pulmonary:     Effort: Pulmonary effort is normal. No respiratory distress.     Breath sounds: Normal breath sounds.  Abdominal:     General: Abdomen is flat. There is no distension.     Palpations: Abdomen is soft.     Tenderness: There is no abdominal tenderness.  Musculoskeletal:        General: Normal range of motion.     Cervical back: Normal range of motion.  Skin:    General: Skin is warm and dry.     Capillary Refill: Capillary refill takes less than 2 seconds.  Neurological:     General: No focal deficit present.     Mental Status: She is alert.     Comments: Speech is clear, able to follow commands CN III-XII intact Normal strength in upper and lower extremities bilaterally including dorsiflexion and plantar flexion, strong and equal grip strength Sensation normal to light and sharp touch Moves extremities without ataxia, coordination intact Normal finger to nose and rapid alternating movements No pronator drift    Psychiatric:        Mood and Affect: Mood normal.    ED Results / Procedures / Treatments   Labs (all labs  ordered are listed, but only abnormal results are displayed) Labs Reviewed  BASIC METABOLIC PANEL  CBC  URINALYSIS, ROUTINE W REFLEX MICROSCOPIC    EKG None  Radiology No results found.  Procedures Procedures   Medications Ordered in ED Medications - No data to display  ED Course  I have reviewed the triage vital signs and the nursing notes.  Pertinent labs & imaging results that were available during my care of the patient were reviewed by me and considered in my medical decision making (see chart for details).    MDM Rules/Calculators/A&P                         69 year old patient previously healthy who presents for evaluation of chronic headaches and new right-sided unilateral tingling.  DDx includes pinched nerve, cervical radiculopathy, brain neoplasm, osteoporosis, spinal stenosis.  CBC CMP and UA all within normal limits.  CT head without any intracranial abnormalities.  CT cervical spine with degenerative disc and facet disease with bilateral neural foraminal narrowing.  No acute bony abnormalities.  All labs and imaging were independently reviewed and interpreted by myself, Kathe Becton, PA-C.  Patient symptoms most likely due to to this cervical degeneration.  Advised patient on anti-inflammatories and need for spine and neuro follow-up.  All questions asked and answered.  At this point, patient is cleared to discharge home with outpatient follow-up.  Final Clinical Impression(s) / ED Diagnoses Final diagnoses:  Degenerative cervical spinal stenosis    Rx / DC Orders ED Discharge Orders     None        Tonye Pearson, PA-C 07/08/21 1851    Lajean Saver, MD 07/09/21 [phone]

## 2021-07-08 NOTE — ED Notes (Signed)
Due to high acuity of assignment, Patty RN and Curator handles this Pt. Extent of assignment was quick final assessment and discharge only

## 2021-07-08 NOTE — ED Triage Notes (Signed)
Patient reports to the ER for tingling on the right side and headaches. Patient reports it has been going on for between 5-7 days. Patient reports she called her PCP and was told to come to the ER for imaging and evaluation. Patient describes the sensation as pins and needles in the right arm, leg, and finger, denies loss of sensation, denies changes in facial feeling. Denies vision changes or speech deficits.

## 2021-07-08 NOTE — Progress Notes (Signed)
Virtual Visit Consent   TNYA ADES, you are scheduled for a virtual visit with a [facility] provider today.     Just as with appointments in the office, your consent must be obtained to participate.  Your consent will be active for this visit and any virtual visit you may have with one of our providers in the next 365 days.     If you have a MyChart account, a copy of this consent can be sent to you electronically.  All virtual visits are billed to your insurance company just like a traditional visit in the office.    As this is a virtual visit, video technology does not allow for your provider to perform a traditional examination.  This may limit your provider's ability to fully assess your condition.  If your provider identifies any concerns that need to be evaluated in person or the need to arrange testing (such as labs, EKG, etc.), we will make arrangements to do so.     Although advances in technology are sophisticated, we cannot ensure that it will always work on either your end or our end.  If the connection with a video visit is poor, the visit may have to be switched to a telephone visit.  With either a video or telephone visit, we are not always able to ensure that we have a secure connection.     I need to obtain your verbal consent now.   Are you willing to proceed with your visit today?    Jenny [name] has provided verbal consent on 07/08/2021 for a virtual visit (video or telephone). Called patient prior to appointment time due to reason for visit and wanting to evaluate quicker. Video was not used.    Apolonio Schneiders, FNP   Date: 07/08/2021 9:59 AM   Virtual Visit via Video Note   I, Apolonio Schneiders, connected with  Jenny [name]  ([id number], September 21, 1950) on 07/08/21 at 11:00 AM EST by a video-enabled telemedicine application and verified that I am speaking with the correct person using two identifiers.  Location: Patient: Virtual Visit Location Patient:  Home Provider: Virtual Visit Location Provider: Home Office   I discussed the limitations of evaluation and management by telemedicine and the availability of in person appointments. The patient expressed understanding and agreed to proceed.    History of Present Illness: Jenny [name] is a 70 y.o. who identifies as a female who was assigned female at birth, and is being seen today with ongoing symptoms of numbness and tingling on her right side.  She has had a headache for the past 48 hours.   She has also noticed a bruise on the top of her foot that she cannot remember hitting it on anything.  She denies a history of heart attack or stroke  Mother died of embolism.   Denies any other recent systemic symptoms.   Symptom onset was 07/02/21 she called her PCP and was instructed to go to ER at that time.     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   Rotator cuff syndrome of right shoulder 11/17/2018   Essential tremor 09/21/2018   Laryngopharyngeal reflux (LPR) 09/01/2017   Family history of Alzheimer's disease 10/30/2014   GERD (gastroesophageal reflux disease) 07/25/2013   Osteoporosis 06/17/2011    Allergies:  Allergies  Allergen Reactions   Codeine Palpitations    RAPID HEART BEAT   Hydrocodeine [Dihydrocodeine] Other (See Comments)    Rapid heartbeat   Fosamax [Alendronate Sodium] Other (See Comments)    GI upset   Medications:  Current Outpatient Medications:    CALCIUM PO, Take by mouth., Disp: , Rfl:    cyclobenzaprine (FLEXERIL) 5 MG tablet, Take 1 tablet (5 mg total) by mouth at bedtime as needed for muscle spasms., Disp: 30 tablet, Rfl: 2   denosumab (PROLIA) 60 MG/ML SOSY injection, Inject 60 mg into the skin every 6 (six) months. (Patient not taking: Reported on 04/09/2021), Disp: 1  mL, Rfl: 0   famotidine (PEPCID) 20 MG tablet, One at bedtime, Disp: 30 tablet, Rfl: 2   Multiple Vitamin (MULTIVITAMIN PO), Take 1 tablet by mouth daily. Centrum Silver, Disp: , Rfl:    VITAMIN D PO, Take by mouth., Disp: , Rfl:   Observations/Objective: No physical exam was performed this was a telephone call only  No labored breathing.  Speech is clear and coherent with logical content.  Patient is alert and oriented at baseline.    Assessment and Plan: 1. Numbness and tingling Discussed with patient the importance of being evaluated in person. Her and husband are on the phone and agreeable to be evaluated in ER today as discussed.   Discussed stroke causes and symptoms at length along with need for imaging to be diagnosed.   Patient is aware of nearest hospital, has husband for transportation and is agreeable to go now.   Follow Up Instructions: I discussed the assessment and treatment plan with the patient. The patient was provided an opportunity to ask questions and all were answered. The patient agreed with the plan and demonstrated an understanding of the instructions.  A copy of instructions were sent to the patient via MyChart unless otherwise noted below.     Time:  I spent 15 minutes with the patient via telehealth technology discussing the above problems/concerns.    Apolonio Schneiders, FNP

## 2021-07-08 NOTE — Discharge Instructions (Addendum)
Your CT of your head was negative today for intracranial pathology.  However, the CT of your cervical spine shows some moderate narrowing of your spinal canal, which is likely causing your symptoms.  I recommend that you have a follow-up appointment with neuro spine specialist who can probably manage and treat your symptoms going forward.  I have given you a referral to 1 that you can call and make an appointment with today if you like.  In the meantime you can continue managing her symptoms with ibuprofen or Tylenol, but generally this can only do so much.  Dr. Jordan Likes, [place]

## 2021-07-08 NOTE — ED Notes (Signed)
Patient transported to CT 

## 2021-08-06 DIAGNOSIS — R202 Paresthesia of skin: Secondary | ICD-10-CM | POA: Diagnosis not present

## 2021-08-21 DIAGNOSIS — M2578 Osteophyte, vertebrae: Secondary | ICD-10-CM | POA: Diagnosis not present

## 2021-08-21 DIAGNOSIS — R202 Paresthesia of skin: Secondary | ICD-10-CM | POA: Diagnosis not present

## 2021-08-21 DIAGNOSIS — G9389 Other specified disorders of brain: Secondary | ICD-10-CM | POA: Diagnosis not present

## 2021-09-05 DIAGNOSIS — R030 Elevated blood-pressure reading, without diagnosis of hypertension: Secondary | ICD-10-CM | POA: Diagnosis not present

## 2021-09-05 DIAGNOSIS — R202 Paresthesia of skin: Secondary | ICD-10-CM | POA: Diagnosis not present

## 2021-09-23 ENCOUNTER — Other Ambulatory Visit: Payer: Self-pay | Admitting: Family Medicine

## 2021-09-23 ENCOUNTER — Ambulatory Visit
Admission: RE | Admit: 2021-09-23 | Discharge: 2021-09-23 | Disposition: A | Discharge status: Home / Self Care | Payer: Medicare Other | Source: Ambulatory Visit | Attending: Family Medicine | Admitting: Family Medicine

## 2021-09-23 DIAGNOSIS — Z1231 Encounter for screening mammogram for malignant neoplasm of breast: Secondary | ICD-10-CM

## 2021-09-26 ENCOUNTER — Telehealth: Payer: Self-pay

## 2021-09-26 NOTE — Telephone Encounter (Signed)
Patient is scheduled for her Prolia at the end of the month and want to make sure her PA has been completed.  ?

## 2021-09-27 ENCOUNTER — Telehealth: Payer: Self-pay

## 2021-09-27 NOTE — Telephone Encounter (Signed)
Pt returned a call to our office. She is wanting to know how much she will need to pay for her Prolia. ?

## 2021-09-27 NOTE — Telephone Encounter (Signed)
Patient verfied via [employer]. ? ?No PA needed  ? ?$295 ? ?Ready to schedule ?

## 2021-09-27 NOTE — Telephone Encounter (Signed)
See note Prolia ?

## 2021-09-30 NOTE — Telephone Encounter (Signed)
Patient was told by her insurance she has a $0 cost oop- patient will call insurance again.  ?

## 2021-10-01 ENCOUNTER — Telehealth: Payer: Self-pay | Admitting: Family Medicine

## 2021-10-01 NOTE — Telephone Encounter (Signed)
A rep from [facility] called to try and reschedule pt's physical earlier. They also stated the patient should only have to pay $255 for Prolia and not $295. They are also wanting to check into the previous Prolia injection on 04/09/21. I directed them to billing for the previous appt.  ?

## 2021-10-02 NOTE — Telephone Encounter (Signed)
Ok to collect $255  ?

## 2021-10-07 NOTE — Telephone Encounter (Signed)
Noted  

## 2021-10-08 ENCOUNTER — Ambulatory Visit: Payer: Medicare Other

## 2021-10-31 NOTE — Telephone Encounter (Signed)
Last Prolia inj 04/09/21 ?Next Prolia inj due 10/08/21 ?

## 2021-10-31 NOTE — Telephone Encounter (Signed)
Prior auth required for PROLIA  PA PROCESS DETAILS: Please complete the prior authorization form located at [email or web address] or call [phone]  

## 2021-11-05 DIAGNOSIS — R202 Paresthesia of skin: Secondary | ICD-10-CM | POA: Diagnosis not present

## 2021-11-05 NOTE — Telephone Encounter (Addendum)
Prior auth APPROVED ?PA# [id number] ?Valid 11/06/21-11/07/22 ? ? ? ?

## 2021-11-06 NOTE — Telephone Encounter (Addendum)
Pt ready for scheduling on or after 10/08/21 ? ?Out-of-pocket cost due at time of visit: $296 ? ?Primary: [employer] Medicare ?Prolia co-insurance: 20% (approximately $276) ?Admin fee co-insurance: $20 ? ?Secondary: n/a ?Prolia co-insurance:  ?Admin fee co-insurance:  ? ?Deductible: does not apply ? ?Prior Auth: APPROVED ?PA# [id number] ?Valid 11/06/21-11/07/22 ? ?** This summary of benefits is an estimation of the patient's out-of-pocket cost. Exact cost may vary based on individual plan coverage.  ? ?

## 2021-11-07 NOTE — Telephone Encounter (Signed)
Patient can not afford injection.  Please advise on next treatment options.

## 2021-11-07 NOTE — Telephone Encounter (Signed)
Called patient to schedule appt.  Patient states she had thought INS was to cover all of prolia.    She can not afford $296 every six months for Prolia.  Patient would like to know if there are any other options other than Prolia.

## 2021-11-08 MED ORDER — IBANDRONATE SODIUM 150 MG PO TABS: 150.0000 mg | ORAL_TABLET | ORAL | 3 refills | Status: DC

## 2021-11-08 NOTE — Telephone Encounter (Signed)
Spoke with pt and gave her some alternatives to compare the prices with insurance company and she will get back to [place]. ? ?

## 2021-11-08 NOTE — Addendum Note (Signed)
Addended by: Billey Chang on: 11/08/2021 08:11 PM ? ? Modules accepted: Orders ? ?

## 2021-11-19 ENCOUNTER — Telehealth: Payer: Self-pay | Admitting: Family Medicine

## 2021-11-19 NOTE — Telephone Encounter (Signed)
Patient stated she may be allergic to calcium pill- she has not tried it yet- she spoke to pharmacist about it also. -  she would like to continue with prolia shots. She wants to know how can she dispose calcium pills property.  ?

## 2021-11-19 NOTE — Telephone Encounter (Signed)
Pt archived in [email or web address].  Please advise if patient and/or provider wish to proceed with Prolia therpay.  

## 2021-11-21 NOTE — Telephone Encounter (Signed)
Patient states she will continue with prolia and is ok to pay the $296 since she is not able to take Ibanbronate.    Please advise if ok to schedule?  Patient can be reached back at [phone].

## 2021-11-25 NOTE — Telephone Encounter (Signed)
Spoke with pt about getting scheduled for Prolia injection and she stated that she will cb to the fron t and get an appt set up and I also advised her to D/c Boniva and she understood. ?

## 2021-11-25 NOTE — Telephone Encounter (Signed)
I have spoken with pt to schedule her Prolia injection and she stated that she will cb to schedule. ? ?

## 2021-11-26 ENCOUNTER — Ambulatory Visit: Payer: Medicare Other

## 2021-11-26 DIAGNOSIS — M810 Age-related osteoporosis without current pathological fracture: Secondary | ICD-10-CM | POA: Diagnosis not present

## 2021-11-26 MED ORDER — DENOSUMAB 60 MG/ML ~~LOC~~ SOSY
60.0000 mg | PREFILLED_SYRINGE | Freq: Once | SUBCUTANEOUS | Status: AC
  Administered 2021-11-26: 60 mg via SUBCUTANEOUS

## 2021-11-26 NOTE — Progress Notes (Signed)
Per orders of Dr. Jonni Sanger , injection of Prolia  given by Tobe Sos in left arm. Patient tolerated injection well. Patient will make appointment for 6 months. ? ?

## 2021-12-10 NOTE — Telephone Encounter (Signed)
Last Prolia inj 11/26/21 Next Prolia inj due 05/30/22

## 2022-04-07 ENCOUNTER — Encounter: Payer: Self-pay | Admitting: *Deleted

## 2022-04-10 ENCOUNTER — Encounter: Payer: Medicare Other | Admitting: Family Medicine

## 2022-04-26 NOTE — Telephone Encounter (Signed)
Prolia VOB initiated via [email or web address]  Last Prolia inj 11/26/21 Next Prolia inj due 05/30/22

## 2022-05-04 NOTE — Telephone Encounter (Signed)
Pt ready for scheduling on or after 05/30/22   Out-of-pocket cost due at time of visit: $296   Primary: [employer] Prolia co-insurance: 20% (approximately $276) Admin fee co-insurance: $20   Secondary: n/a Prolia co-insurance:  Admin fee co-insurance:    Deductible: does not apply   Prior Auth: APPROVED PA# [id number] Valid 11/06/21-11/07/22   ** This summary of benefits is an estimation of the patient's out-of-pocket cost. Exact cost may vary based on individual plan coverage.

## 2022-05-12 ENCOUNTER — Encounter: Payer: Self-pay | Admitting: Family Medicine

## 2022-05-12 ENCOUNTER — Ambulatory Visit (INDEPENDENT_AMBULATORY_CARE_PROVIDER_SITE_OTHER): Payer: Medicare Other | Admitting: Family Medicine

## 2022-05-12 ENCOUNTER — Telehealth: Payer: Self-pay | Admitting: Family Medicine

## 2022-05-12 VITALS — BP 110/78 | HR 81 | Temp 98.2°F | Ht 66.0 in | Wt 139.0 lb

## 2022-05-12 DIAGNOSIS — M810 Age-related osteoporosis without current pathological fracture: Secondary | ICD-10-CM | POA: Diagnosis not present

## 2022-05-12 DIAGNOSIS — K219 Gastro-esophageal reflux disease without esophagitis: Secondary | ICD-10-CM

## 2022-05-12 DIAGNOSIS — Z0000 Encounter for general adult medical examination without abnormal findings: Secondary | ICD-10-CM | POA: Diagnosis not present

## 2022-05-12 DIAGNOSIS — E782 Mixed hyperlipidemia: Secondary | ICD-10-CM

## 2022-05-12 DIAGNOSIS — Z780 Asymptomatic menopausal state: Secondary | ICD-10-CM

## 2022-05-12 DIAGNOSIS — M4722 Other spondylosis with radiculopathy, cervical region: Secondary | ICD-10-CM | POA: Diagnosis not present

## 2022-05-12 LAB — LIPID PANEL
Cholesterol: 235 mg/dL — ABNORMAL HIGH (ref 0–200)
HDL: 66.3 mg/dL (ref >39.00)
LDL Cholesterol: 147 mg/dL — ABNORMAL HIGH (ref 0–99)
NonHDL: 168.52
Total CHOL/HDL Ratio: 4
Triglycerides: 110 mg/dL (ref 0.0–149.0)
VLDL: 22 mg/dL (ref 0.0–40.0)

## 2022-05-12 LAB — CBC WITH DIFFERENTIAL/PLATELET
Basophils Absolute: 0.1 10*3/uL (ref 0.0–0.1)
Basophils Relative: 1.5 % (ref 0.0–3.0)
Eosinophils Absolute: 0.1 10*3/uL (ref 0.0–0.7)
Eosinophils Relative: 3.1 % (ref 0.0–5.0)
HCT: 40.6 % (ref 36.0–46.0)
Hemoglobin: 13.5 g/dL (ref 12.0–15.0)
Lymphocytes Relative: 37.6 % (ref 12.0–46.0)
Lymphs Abs: 1.6 10*3/uL (ref 0.7–4.0)
MCHC: 33.4 g/dL (ref 30.0–36.0)
MCV: 89 fl (ref 78.0–100.0)
Monocytes Absolute: 0.5 10*3/uL (ref 0.1–1.0)
Monocytes Relative: 11.2 % (ref 3.0–12.0)
Neutro Abs: 2 10*3/uL (ref 1.4–7.7)
Neutrophils Relative %: 46.6 % (ref 43.0–77.0)
Platelets: 221 10*3/uL (ref 150.0–400.0)
RBC: 4.56 Mil/uL (ref 3.87–5.11)
RDW: 13.2 % (ref 11.5–15.5)
WBC: 4.3 10*3/uL (ref 4.0–10.5)

## 2022-05-12 LAB — COMPREHENSIVE METABOLIC PANEL
ALT: 17 U/L (ref 0–35)
AST: 16 U/L (ref 0–37)
Albumin: 4.7 g/dL (ref 3.5–5.2)
Alkaline Phosphatase: 79 U/L (ref 39–117)
BUN: 19 mg/dL (ref 6–23)
CO2: 26 mEq/L (ref 19–32)
Calcium: 9.5 mg/dL (ref 8.4–10.5)
Chloride: 104 mEq/L (ref 96–112)
Creatinine, Ser: 0.8 mg/dL (ref 0.40–1.20)
GFR: 73.47 mL/min (ref >60.00)
Glucose, Bld: 94 mg/dL (ref 70–99)
Potassium: 4.5 mEq/L (ref 3.5–5.1)
Sodium: 140 mEq/L (ref 135–145)
Total Bilirubin: 0.5 mg/dL (ref 0.2–1.2)
Total Protein: 7.1 g/dL (ref 6.0–8.3)

## 2022-05-12 LAB — TSH: TSH: 3.16 u[IU]/mL (ref 0.35–5.50)

## 2022-05-12 NOTE — Telephone Encounter (Signed)
Vml to pls sch prolia shot - After 05/30/22 -

## 2022-05-12 NOTE — Patient Instructions (Addendum)
Please return in 12 months for your annual complete physical; please come fasting.  Please sign Release of records for your Neurosurgical records. I don't have them.  Please schedule your prolia injection. It is due November 15th.   I will release your lab results to you on your MyChart account with further instructions. You may see the results before I do, but when I review them I will send you a message with my report or have my assistant call you if things need to be discussed. Please reply to my message with any questions. Thank you!   If you have any questions or concerns, please don't hesitate to send me a message via MyChart or call the office at [phone]. Thank you for visiting with [place] today! It's our pleasure caring for you.   I have ordered a mammogram and/or bone density for you as we discussed today: '[]'$   Mammogram  '[x]'$   Bone Density  Please call the office checked below to schedule your appointment:  '[x]'$   [facility]      [address] [place], [place]         '[]'$   [facility]  [address] [place], [place]

## 2022-05-12 NOTE — Progress Notes (Signed)
Subjective  Chief Complaint  Patient presents with   Annual Exam    HPI: Jenny [name] is a 71 y.o. female who presents to [facility] today for a Female Wellness Visit. She also has the concerns and/or needs as listed above in the chief complaint. These will be addressed in addition to the Health Maintenance Visit.   Wellness Visit: annual visit with health maintenance review and exam without Pap  HM: mammo nl and up to date. CRC screen current. Feels well. Walks a mile/day for exercise. Diet is fair. Eye exam current. Imms up to date Chronic disease f/u and/or acute problem visit: (deemed necessary to be done in addition to the wellness visit): Had cervical radiculopathy: evaluated by NS and reports MRI did not show surgical findings. Fortunately, her sxs have resolved.  Osteoporosis: last prolia injection was in May. Due now but not scheduled. Then due for repeat dexa.  Gerd stable on otc pepcid HLD: discussed elevated ascvd from last year but deferred statin. Discussed low cholesterol diet. Low risk otherwise for cvd. Feels well. No cp.  The 10-year ASCVD risk score (Arnett DK, et al., 2019) is: 8.9%   Values used to calculate the score:     Age: 73 years     Sex: Female     Is Non-Hispanic African American: No     Diabetic: No     Tobacco smoker: No     Systolic Blood Pressure: 161 mmHg     Is BP treated: No     HDL Cholesterol: 63.8 mg/dL     Total Cholesterol: 224 mg/dL  Assessment  1. Annual physical exam   2. Age-related osteoporosis without current pathological fracture   3. Mixed hyperlipidemia   4. Gastroesophageal reflux disease without esophagitis   5. Asymptomatic menopausal state   6. Osteoarthritis of spine with radiculopathy, cervical region      Plan  Female Wellness Visit: Age appropriate Health Maintenance and Prevention measures were discussed with patient. Included topics are cancer screening recommendations, ways to  keep healthy (see AVS) including dietary and exercise recommendations, regular eye and dental care, use of seat belts, and avoidance of moderate alcohol use and tobacco use. Screens are current BMI: discussed patient's BMI and encouraged positive lifestyle modifications to help get to or maintain a target BMI. HM needs and immunizations were addressed and ordered. See below for orders. See HM and immunization section for updates. utd Routine labs and screening tests ordered including cmp, cbc and lipids where appropriate. Discussed recommendations regarding Vit D and calcium supplementation (see AVS)  Chronic disease management visit and/or acute problem visit: Osteoporosis on prolia; due for next injection. This is year 2. Will repeat dexa and then further mgt upon results. Continue exercise and d/ca.  HLD: counseling and education given. Will recheck fasting levels today.  Jerrye Bushy is controlled.  Cervical spine djd: supportive care. Doing well now.  Follow up: 12 mo for cpe  Orders Placed This Encounter  Procedures   DG Bone Density   CBC with Differential/Platelet   Comprehensive metabolic panel   Lipid panel   TSH   No orders of the defined types were placed in this encounter.     Body mass index is 22.44 kg/m. Wt Readings from Last 3 Encounters:  05/12/22 139 lb (63 kg)  07/08/21 136 lb (61.7 kg)  04/09/21 139 lb 6.4 oz (63.2 kg)     Patient Active Problem List   Diagnosis Date  Noted   Osteoarthritis of spine with radiculopathy, cervical region 05/12/2022   Mixed hyperlipidemia 04/11/2021    Offered statin 2022    Heart murmur 04/09/2021   Adjustment disorder with anxious mood 04/09/2021    Husband dxd with bladder cancer in jan 2022.    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 Modena Jansky in [place] 2019: normal results    Osteoporosis 06/17/2011    By dexa in 2009  -2.8 ls spine needs fu.  Apparently had se of fosmax in the past. Dexa 05/2020 lowest T = -3.3, Lspine; osteoporosis, needs treatment. Failed fosamax. Will rec prolia, vit d and calcium    Health Maintenance  Topic Date Due   Medicare Annual Wellness (AWV)  09/10/2019   COVID-19 Vaccine (3 - Pfizer series) 05/28/2022 (Originally 11/16/2019)   DEXA SCAN  06/12/2022   MAMMOGRAM  09/24/2022   COLONOSCOPY (Pts 45-65yr Insurance coverage will need to be confirmed)  07/15/2027   TETANUS/TDAP  09/19/2027   Pneumonia Vaccine 72 Years old  Completed   INFLUENZA VACCINE  Completed   Hepatitis C Screening  Completed   Zoster Vaccines- Shingrix  Completed   HPV VACCINES  Aged Out   Immunization History  Administered Date(s) Administered   Fluad Quad(high Dose 65+) 03/20/2020, 04/09/2021, 05/07/2022   Influenza Split 04/22/2012, 04/24/2013   Influenza Whole 04/13/2009   Influenza, High Dose Seasonal PF 04/13/2017, 03/28/2018, 03/28/2019   Influenza-Unspecified 05/03/2014, 04/17/2016   PFIZER(Purple Top)SARS-COV-2 Vaccination 08/27/2019, 09/21/2019   Pneumococcal Conjugate-13 10/30/2014   Pneumococcal Polysaccharide-23 09/01/2017   Td 07/14/2006   Tdap 09/18/2017   Zoster Recombinat (Shingrix) 01/27/2018, 05/12/2018   Zoster, Live 08/13/2012   We updated and reviewed the patient's past history in detail and it is documented below. Allergies: Patient is allergic to codeine, hydrocodeine [dihydrocodeine], and fosamax [alendronate sodium]. Past Medical History Patient  has a past medical history of Essential tremor (09/21/2018), GERD (gastroesophageal reflux disease), Laryngopharyngeal reflux (LPR) (09/01/2017), Mixed hyperlipidemia (04/11/2021), and Osteoporosis. Past Surgical History Patient  has a past  surgical history that includes Tonsillectomy and Colonoscopy. Family History: Patient family history includes Alzheimer's disease in her maternal grandmother and mother; Arthritis in her brother and brother; Cancer in her father, maternal grandmother, and maternal uncle; Colon cancer in her maternal grandfather and maternal uncle; Healthy in her daughter and son; Heart disease in her mother; Hyperlipidemia in her mother; Hypertension in her mother; Lung cancer in her maternal aunt; Pulmonary embolism in her brother and mother. Social History:  Patient  reports that she has never smoked. She has never used smokeless tobacco. She reports that she does not drink alcohol and does not use drugs.  Review of Systems: Constitutional: negative for fever or malaise Ophthalmic: negative for photophobia, double vision or loss of vision Cardiovascular: negative for chest pain, dyspnea on exertion, or new LE swelling Respiratory: negative for SOB or persistent cough Gastrointestinal: negative for abdominal pain, change in bowel habits or melena Genitourinary: negative for dysuria or gross hematuria, no abnormal uterine bleeding or disharge Musculoskeletal: negative for new gait disturbance or muscular weakness Integumentary: negative for new or persistent rashes, no breast lumps Neurological: negative for TIA or stroke symptoms Psychiatric: negative for SI or  delusions Allergic/Immunologic: negative for hives  Patient Care Team    Relationship Specialty Notifications Start End  Leamon Arnt, MD PCP - General Family Medicine  09/01/17   Ladene Artist, MD  Gastroenterology  06/16/11    Comment: ? vs Bronwen Betters, MD Consulting Physician Pulmonary Disease  09/01/17     Objective  Vitals: BP 110/78   Pulse 81   Temp 98.2 F (36.8 C)   Ht '5\' 6"'$  (1.676 m)   Wt 139 lb (63 kg)   SpO2 96%   BMI 22.44 kg/m  General:  Well developed, well nourished, no acute distress  Psych:  Alert and  orientedx3,normal mood and affect HEENT:  Normocephalic, atraumatic, non-icteric sclera,  supple neck without adenopathy, mass or thyromegaly Cardiovascular:  Normal S1, S2, RRR without gallop, rub or murmur Respiratory:  Good breath sounds bilaterally, CTAB with normal respiratory effort Gastrointestinal: normal bowel sounds, soft, non-tender, no noted masses. No HSM MSK: no deformities, contusions. Joints are without erythema or swelling.  Skin:  Warm, no rashes or suspicious lesions noted Neurologic:    Mental status is normal. CN 2-11 are normal. Gross motor and sensory exams are normal. Normal gait. No tremor   Commons side effects, risks, benefits, and alternatives for medications and treatment plan prescribed today were discussed, and the patient expressed understanding of the given instructions. Patient is instructed to call or message via MyChart if he/she has any questions or concerns regarding our treatment plan. No barriers to understanding were identified. We discussed Red Flag symptoms and signs in detail. Patient expressed understanding regarding what to do in case of urgent or emergency type symptoms.  Medication list was reconciled, printed and provided to the patient in AVS. Patient instructions and summary information was reviewed with the patient as documented in the AVS. This note was prepared with assistance of Dragon voice recognition software. Occasional wrong-word or sound-a-like substitutions may have occurred due to the inherent limitations of voice recognition software

## 2022-05-13 ENCOUNTER — Other Ambulatory Visit: Payer: Self-pay | Admitting: Family Medicine

## 2022-05-13 DIAGNOSIS — Z780 Asymptomatic menopausal state: Secondary | ICD-10-CM

## 2022-05-13 DIAGNOSIS — M810 Age-related osteoporosis without current pathological fracture: Secondary | ICD-10-CM

## 2022-05-14 NOTE — Telephone Encounter (Signed)
Patient was called as patient was not sch for prolia correctly on 10/25.- Patient is out of town - Patient aware she is to sch prolia shot on 11/17 or after -   Patient will call to sch apt as soon as she come back from out of town .    Out-of-pocket cost due at time of visit: $296   Primary: [facility] [employer] Prolia co-insurance: 20% (approximately $276) Admin fee co-insurance: [phone]

## 2022-05-15 ENCOUNTER — Other Ambulatory Visit: Payer: Self-pay

## 2022-05-15 ENCOUNTER — Telehealth: Payer: Self-pay | Admitting: Family Medicine

## 2022-05-15 DIAGNOSIS — E782 Mixed hyperlipidemia: Secondary | ICD-10-CM

## 2022-05-15 MED ORDER — ATORVASTATIN CALCIUM 10 MG PO TABS: 10.0000 mg | ORAL_TABLET | Freq: Every evening | ORAL | 3 refills | Status: DC

## 2022-05-15 NOTE — Telephone Encounter (Signed)
Pt requests: -call for results that arrived in [place] on 05/12/22, not a [employer].

## 2022-05-15 NOTE — Telephone Encounter (Signed)
Pt would like to have her lab results mailed to her at the address listed in her chart.

## 2022-05-15 NOTE — Progress Notes (Signed)
Please call patient: I have reviewed his/her lab results.  Pt wants a phone call not a mychart message please. She also wants the results mailed to her.  Cholesterol remains elevated and again, a statin is indicated if she will take it to lower her risk of heart attack or stroke. Lipitor 10 can be ordered if she wants. All other labs are normal.   The 10-year ASCVD risk score (Arnett DK, et al., 2019) is: 9%   Values used to calculate the score:     Age: 71 years     Sex: Female     Is Non-Hispanic African American: No     Diabetic: No     Tobacco smoker: No     Systolic Blood Pressure: 309 mmHg     Is BP treated: No     HDL Cholesterol: 66.3 mg/dL     Total Cholesterol: 235 mg/dL

## 2022-05-22 DIAGNOSIS — L814 Other melanin hyperpigmentation: Secondary | ICD-10-CM | POA: Diagnosis not present

## 2022-05-22 DIAGNOSIS — L570 Actinic keratosis: Secondary | ICD-10-CM | POA: Diagnosis not present

## 2022-05-22 DIAGNOSIS — L821 Other seborrheic keratosis: Secondary | ICD-10-CM | POA: Diagnosis not present

## 2022-05-22 DIAGNOSIS — D225 Melanocytic nevi of trunk: Secondary | ICD-10-CM | POA: Diagnosis not present

## 2022-06-02 ENCOUNTER — Other Ambulatory Visit (HOSPITAL_COMMUNITY): Payer: Self-pay

## 2022-06-03 ENCOUNTER — Other Ambulatory Visit (HOSPITAL_COMMUNITY): Payer: Self-pay

## 2022-06-04 ENCOUNTER — Other Ambulatory Visit (HOSPITAL_COMMUNITY): Payer: Self-pay

## 2022-07-01 ENCOUNTER — Ambulatory Visit (INDEPENDENT_AMBULATORY_CARE_PROVIDER_SITE_OTHER): Payer: Medicare Other

## 2022-07-01 DIAGNOSIS — M810 Age-related osteoporosis without current pathological fracture: Secondary | ICD-10-CM | POA: Diagnosis not present

## 2022-07-01 MED ORDER — DENOSUMAB 60 MG/ML ~~LOC~~ SOSY
60.0000 mg | PREFILLED_SYRINGE | Freq: Once | SUBCUTANEOUS | Status: AC
  Administered 2022-07-01: 60 mg via SUBCUTANEOUS

## 2022-07-01 NOTE — Progress Notes (Cosign Needed Addendum)
Patient was given Prolia in her left arm. Patient tolerated injection well. Joanette Gula, CMA

## 2022-07-08 NOTE — Telephone Encounter (Signed)
Last Prolia inj 07/01/22 Next Prolia inj due 01/01/23  

## 2022-07-22 ENCOUNTER — Ambulatory Visit (INDEPENDENT_AMBULATORY_CARE_PROVIDER_SITE_OTHER): Payer: Medicare Other

## 2022-07-22 VITALS — Wt 137.0 lb

## 2022-07-22 DIAGNOSIS — Z0000 Encounter for general adult medical examination without abnormal findings: Secondary | ICD-10-CM | POA: Diagnosis not present

## 2022-07-22 NOTE — Patient Instructions (Signed)
[name] , Thank you for taking time to come for your Medicare Wellness Visit. I appreciate your ongoing commitment to your health goals. Please review the following plan we discussed and let me know if I can assist you in the future.   These are the goals we discussed:  Goals      Patient Stated     Increase memory skills to prevent memory loss.      Patient Stated     Continue memory skills.      Patient Stated     Stay active         This is a list of the screening recommended for you and due dates:  Health Maintenance  Topic Date Due   COVID-19 Vaccine (3 - 2023-24 season) 03/14/2022   DEXA scan (bone density measurement)  06/12/2022   Mammogram  09/24/2022   Medicare Annual Wellness Visit  07/23/2023   Colon Cancer Screening  07/15/2027   DTaP/Tdap/Td vaccine (3 - Td or Tdap) 09/19/2027   Pneumonia Vaccine  Completed   Flu Shot  Completed   Hepatitis C Screening: USPSTF Recommendation to screen - Ages 18-79 yo.  Completed   Zoster (Shingles) Vaccine  Completed   HPV Vaccine  Aged Out    Advanced directives: Please bring a copy of your health care power of attorney and living will to the office at your convenience.  Conditions/risks identified: stay active   Next appointment: Follow up in one year for your annual wellness visit    Preventive Care 65 Years and Older, Female Preventive care refers to lifestyle choices and visits with your health care provider that can promote health and wellness. What does preventive care include? A yearly physical exam. This is also called an annual well check. Dental exams once or twice a year. Routine eye exams. Ask your health care provider how often you should have your eyes checked. Personal lifestyle choices, including: Daily care of your teeth and gums. Regular physical activity. Eating a healthy diet. Avoiding tobacco and drug use. Limiting alcohol use. Practicing safe sex. Taking low-dose aspirin every  day. Taking vitamin and mineral supplements as recommended by your health care provider. What happens during an annual well check? The services and screenings done by your health care provider during your annual well check will depend on your age, overall health, lifestyle risk factors, and family history of disease. Counseling  Your health care provider may ask you questions about your: Alcohol use. Tobacco use. Drug use. Emotional well-being. Home and relationship well-being. Sexual activity. Eating habits. History of falls. Memory and ability to understand (cognition). Work and work Statistician. Reproductive health. Screening  You may have the following tests or measurements: Height, weight, and BMI. Blood pressure. Lipid and cholesterol levels. These may be checked every 5 years, or more frequently if you are over 73 years old. Skin check. Lung cancer screening. You may have this screening every year starting at age 87 if you have a 30-pack-year history of smoking and currently smoke or have quit within the past 15 years. Fecal occult blood test (FOBT) of the stool. You may have this test every year starting at age 49. Flexible sigmoidoscopy or colonoscopy. You may have a sigmoidoscopy every 5 years or a colonoscopy every 10 years starting at age 21. Hepatitis C blood test. Hepatitis B blood test. Sexually transmitted disease (STD) testing. Diabetes screening. This is done by checking your blood sugar (glucose) after you have not eaten for a while (  fasting). You may have this done every 1-3 years. Bone density scan. This is done to screen for osteoporosis. You may have this done starting at age 12. Mammogram. This may be done every 1-2 years. Talk to your health care provider about how often you should have regular mammograms. Talk with your health care provider about your test results, treatment options, and if necessary, the need for more tests. Vaccines  Your health care  provider may recommend certain vaccines, such as: Influenza vaccine. This is recommended every year. Tetanus, diphtheria, and acellular pertussis (Tdap, Td) vaccine. You may need a Td booster every 10 years. Zoster vaccine. You may need this after age 25. Pneumococcal 13-valent conjugate (PCV13) vaccine. One dose is recommended after age 21. Pneumococcal polysaccharide (PPSV23) vaccine. One dose is recommended after age 96. Talk to your health care provider about which screenings and vaccines you need and how often you need them. This information is not intended to replace advice given to you by your health care provider. Make sure you discuss any questions you have with your health care provider. Document Released: 07/27/2015 Document Revised: 03/19/2016 Document Reviewed: 05/01/2015 Elsevier Interactive Patient Education  2017 [employer] Prevention in the Home Falls can cause injuries. They can happen to people of all ages. There are many things you can do to make your home safe and to help prevent falls. What can I do on the outside of my home? Regularly fix the edges of walkways and driveways and fix any cracks. Remove anything that might make you trip as you walk through a door, such as a raised step or threshold. Trim any bushes or trees on the path to your home. Use bright outdoor lighting. Clear any walking paths of anything that might make someone trip, such as rocks or tools. Regularly check to see if handrails are loose or broken. Make sure that both sides of any steps have handrails. Any raised decks and porches should have guardrails on the edges. Have any leaves, snow, or ice cleared regularly. Use sand or salt on walking paths during winter. Clean up any spills in your garage right away. This includes oil or grease spills. What can I do in the bathroom? Use night lights. Install grab bars by the toilet and in the tub and shower. Do not use towel bars as grab  bars. Use non-skid mats or decals in the tub or shower. If you need to sit down in the shower, use a plastic, non-slip stool. Keep the floor dry. Clean up any water that spills on the floor as soon as it happens. Remove soap buildup in the tub or shower regularly. Attach bath mats securely with double-sided non-slip rug tape. Do not have throw rugs and other things on the floor that can make you trip. What can I do in the bedroom? Use night lights. Make sure that you have a light by your bed that is easy to reach. Do not use any sheets or blankets that are too big for your bed. They should not hang down onto the floor. Have a firm chair that has side arms. You can use this for support while you get dressed. Do not have throw rugs and other things on the floor that can make you trip. What can I do in the kitchen? Clean up any spills right away. Avoid walking on wet floors. Keep items that you use a lot in easy-to-reach places. If you need to reach something above you, use  a strong step stool that has a grab bar. Keep electrical cords out of the way. Do not use floor polish or wax that makes floors slippery. If you must use wax, use non-skid floor wax. Do not have throw rugs and other things on the floor that can make you trip. What can I do with my stairs? Do not leave any items on the stairs. Make sure that there are handrails on both sides of the stairs and use them. Fix handrails that are broken or loose. Make sure that handrails are as long as the stairways. Check any carpeting to make sure that it is firmly attached to the stairs. Fix any carpet that is loose or worn. Avoid having throw rugs at the top or bottom of the stairs. If you do have throw rugs, attach them to the floor with carpet tape. Make sure that you have a light switch at the top of the stairs and the bottom of the stairs. If you do not have them, ask someone to add them for you. What else can I do to help prevent  falls? Wear shoes that: Do not have high heels. Have rubber bottoms. Are comfortable and fit you well. Are closed at the toe. Do not wear sandals. If you use a stepladder: Make sure that it is fully opened. Do not climb a closed stepladder. Make sure that both sides of the stepladder are locked into place. Ask someone to hold it for you, if possible. Clearly mark and make sure that you can see: Any grab bars or handrails. First and last steps. Where the edge of each step is. Use tools that help you move around (mobility aids) if they are needed. These include: Canes. Walkers. Scooters. Crutches. Turn on the lights when you go into a dark area. Replace any light bulbs as soon as they burn out. Set up your furniture so you have a clear path. Avoid moving your furniture around. If any of your floors are uneven, fix them. If there are any pets around you, be aware of where they are. Review your medicines with your doctor. Some medicines can make you feel dizzy. This can increase your chance of falling. Ask your doctor what other things that you can do to help prevent falls. This information is not intended to replace advice given to you by your health care provider. Make sure you discuss any questions you have with your health care provider. Document Released: 04/26/2009 Document Revised: 12/06/2015 Document Reviewed: 08/04/2014 Elsevier Interactive Patient Education  2017 [employer].

## 2022-07-22 NOTE — Progress Notes (Addendum)
I connected with  Jenny [name] on 07/22/22 by a audio enabled telemedicine application and verified that I am speaking with the correct person using two identifiers.  Patient Location: Home  Provider Location: Office/Clinic  I discussed the limitations of evaluation and management by telemedicine. The patient expressed understanding and agreed to proceed.   Subjective:   Jenny [name] is a 72 y.o. female who presents for Medicare Annual (Subsequent) preventive examination.  Review of Systems           Objective:    Today's Vitals   07/22/22 1007  Weight: 137 lb (62.1 kg)   Body mass index is 22.11 kg/m.     07/22/2022   10:11 AM 07/08/2021    1:40 PM 08/31/2019    3:42 PM 09/09/2018    8:26 AM 09/08/2017    9:17 AM  Advanced Directives  Does Patient Have a Medical Advance Directive? Yes No;Yes No Yes Yes  Type of Paramedic of [place];Living will Living will;Healthcare Power of Attorney  Living will;Healthcare Power of Attorney Living will;Healthcare Power of Attorney  Copy of [place] in Chart? No - copy requested   Yes - validated most recent copy scanned in chart (See row information) No - copy requested  Would patient like information on creating a medical advance directive?   No - Patient declined      Current Medications (verified) Outpatient Encounter Medications as of 07/22/2022  Medication Sig   atorvastatin (LIPITOR) 10 MG tablet Take 1 tablet (10 mg total) by mouth at bedtime.   CALCIUM PO Take by mouth.   famotidine (PEPCID) 20 MG tablet One at bedtime   FLUZONE HIGH-DOSE QUADRIVALENT 0.7 ML SUSY    Multiple Vitamin (MULTIVITAMIN PO) Take 1 tablet by mouth daily. Centrum Silver   VITAMIN D PO Take by mouth.   No facility-administered encounter medications on file as of 07/22/2022.    Allergies (verified) Codeine, Hydrocodeine [dihydrocodeine], and Fosamax [alendronate sodium]   History: Past Medical  History:  Diagnosis Date   Essential tremor 09/21/2018   GERD (gastroesophageal reflux disease)    Laryngopharyngeal reflux (LPR) 09/01/2017   Had eval by GI and pulm   Mixed hyperlipidemia 04/11/2021   Offered statin 2022   Osteoporosis    by dexa 2009 had se of fosamax   Past Surgical History:  Procedure Laterality Date   COLONOSCOPY     TONSILLECTOMY     age 61   Family History  Problem Relation Age of Onset   Hyperlipidemia Mother    Heart disease Mother    Hypertension Mother    Alzheimer's disease Mother    Pulmonary embolism Mother    Cancer Father        unsure of type   Pulmonary embolism Brother    Arthritis Brother    Arthritis Brother    Alzheimer's disease Maternal Grandmother    Cancer Maternal Grandmother    Healthy Daughter    Healthy Son    Lung cancer Maternal Aunt        non smoker   Cancer Maternal Uncle    Colon cancer Maternal Uncle    Colon cancer Maternal Grandfather    Social History   Socioeconomic History   Marital status: Married    Spouse name: [name]   Number of children: 2   Years of education: Not on file   Highest education level: Not on file  Occupational History   Occupation: retired - sewer,  sales real estate book  Tobacco Use   Smoking status: Never   Smokeless tobacco: Never  Vaping Use   Vaping Use: Never used  Substance and Sexual Activity   Alcohol use: No    Alcohol/week: 0.0 standard drinks of alcohol   Drug use: No   Sexual activity: Not Currently    Birth control/protection: Post-menopausal  Other Topics Concern   Not on file  Social History Narrative   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    Married   Regular exercise- yes   8 hours of sleep originally from [place] Strain: Low Risk  (07/22/2022)   Overall Financial Resource Strain (CARDIA)    Difficulty of Paying Living Expenses: Not hard at all  Food Insecurity:  No Food Insecurity (07/22/2022)   Hunger Vital Sign    Worried About Running Out of Food in the Last Year: Never true    [place] in the Last Year: Never true  Transportation Needs: No Transportation Needs (07/22/2022)   PRAPARE - Hydrologist (Medical): No    Lack of Transportation (Non-Medical): No  Physical Activity: Insufficiently Active (07/22/2022)   Exercise Vital Sign    Days of Exercise per Week: 7 days    Minutes of Exercise per Session: 20 min  Stress: No Stress Concern Present (07/22/2022)   [place]    Feeling of Stress : Not at all  Social Connections: [place] (07/22/2022)   Social Connection and Isolation Panel [NHANES]    Frequency of Communication with Friends and Family: More than three times a week    Frequency of Social Gatherings with Friends and Family: More than three times a week    Attends Religious Services: More than 4 times per year    Active Member of [employer] or Organizations: Yes    Attends Archivist Meetings: 1 to 4 times per year    Marital Status: Married    Tobacco Counseling Counseling given: Not Answered   Clinical Intake:  Pre-visit preparation completed: Yes  Pain : No/denies pain     BMI - recorded: 22.11 Nutritional Status: BMI of 19-24  Normal Nutritional Risks: None Diabetes: No  How often do you need to have someone help you when you read instructions, pamphlets, or other written materials from your doctor or pharmacy?: 1 - Never  Diabetic?no  Interpreter Needed?: No  Information entered by :: Charlott Rakes, LPN   Activities of Daily Living     No data to display          Patient Care Team: Leamon Arnt, MD as PCP - General (Family Medicine) Ladene Artist, MD (Gastroenterology) Tanda Rockers, MD as Consulting Physician (Pulmonary Disease)  Indicate any recent Medical Services you may have  received from other than [facility] providers in the past year (date may be approximate).     Assessment:   This is a routine wellness examination for Jenny [name].  Hearing/Vision screen Hearing Screening - Comments:: Pt denies any hearing issues  Vision Screening - Comments:: Pt follows up with [facility] for [facility] eye exams   Dietary issues and exercise activities discussed:     Goals Addressed             This Visit's Progress    Patient Stated  Stay active        Depression Screen    07/22/2022   10:09 AM 05/12/2022   10:05 AM 04/09/2021    9:25 AM 03/05/2020   10:05 AM 09/09/2018    8:26 AM 09/08/2017    9:17 AM 09/08/2017    8:23 AM  PHQ 2/9 Scores  PHQ - 2 Score 0 0 0 0 0 0 0    Fall Risk    07/22/2022   10:12 AM 05/12/2022   10:05 AM 04/09/2021    9:25 AM 03/05/2020   10:10 AM 09/09/2018    8:26 AM  Fall Risk   Falls in the past year? 0 0 0 0 0  Number falls in past yr: 0 0 0 0   Injury with Fall? 0 0 0 0   Risk for fall due to : Impaired vision No Fall Risks     Follow up Falls prevention discussed Falls evaluation completed       FALL RISK PREVENTION PERTAINING TO THE HOME:  Any stairs in or around the home? Yes  If so, are there any without handrails? No  Home free of loose throw rugs in walkways, pet beds, electrical cords, etc? Yes  Adequate lighting in your home to reduce risk of falls? Yes   ASSISTIVE DEVICES UTILIZED TO PREVENT FALLS:  Life alert? No  Use of a cane, walker or w/c? No  Grab bars in the bathroom? Yes  Shower chair or bench in shower? No  Elevated toilet seat or a handicapped toilet? No   TIMED UP AND GO:  Was the test performed? No .   Cognitive Function: see note below    09/09/2018    8:26 AM  MMSE - Mini Mental State Exam  Orientation to time 5  Orientation to Place 5  Registration 3  Attention/ Calculation 5  Recall 3  Language- name 2 objects 2  Language- repeat 1  Language- follow 3 step command 3  Language- read  & follow direction 1  Write a sentence 1  Copy design 1  Total score 30        Immunizations Immunization History  Administered Date(s) Administered   Fluad Quad(high Dose 65+) 03/20/2020, 04/09/2021, 05/07/2022   Influenza Split 04/22/2012, 04/24/2013   Influenza Whole 04/13/2009   Influenza, High Dose Seasonal PF 04/13/2017, 03/28/2018, 03/28/2019   Influenza-Unspecified 05/03/2014, 04/17/2016   PFIZER(Purple Top)SARS-COV-2 Vaccination 08/27/2019, 09/21/2019   Pneumococcal Conjugate-13 10/30/2014   Pneumococcal Polysaccharide-23 09/01/2017   Td 07/14/2006   Tdap 09/18/2017   Zoster Recombinat (Shingrix) 01/27/2018, 05/12/2018   Zoster, Live 08/13/2012    TDAP status: Up to date  Flu Vaccine status: Up to date  Pneumococcal vaccine status: Up to date  Covid-19 vaccine status: Completed vaccines  Qualifies for Shingles Vaccine? Yes   Zostavax completed Yes   Shingrix Completed?: Yes  Screening Tests Health Maintenance  Topic Date Due   COVID-19 Vaccine (3 - 2023-24 season) 03/14/2022   DEXA SCAN  06/12/2022   MAMMOGRAM  09/24/2022   Medicare Annual Wellness (AWV)  07/23/2023   COLONOSCOPY (Pts 45-102yr Insurance coverage will need to be confirmed)  07/15/2027   DTaP/Tdap/Td (3 - Td or Tdap) 09/19/2027   Pneumonia Vaccine 72 Years old  Completed   INFLUENZA VACCINE  Completed   Hepatitis C Screening  Completed   Zoster Vaccines- Shingrix  Completed   HPV VACCINES  Aged Out    Health Maintenance  Health Maintenance Due  Topic Date Due  COVID-19 Vaccine (3 - 2023-24 season) 03/14/2022   DEXA SCAN  06/12/2022    Colorectal cancer screening: Type of screening: Colonoscopy. Completed 07/14/17. Repeat every 10 years  Mammogram status: Completed 09/23/21. Repeat every year  Bone Density status: Completed 06/12/20. Results reflect: Bone density results: OSTEOPOROSIS. Repeat every scheduled appt 10/30/22 years.  Additional Screening:  Hepatitis C Screening:   Completed 09/08/17  Vision Screening: Recommended annual ophthalmology exams for early detection of glaucoma and other disorders of the eye. Is the patient up to date with their annual eye exam?  Yes  Who is the provider or what is the name of the office in which the patient attends annual eye exams? [facility] If pt is not established with a provider, would they like to be referred to a provider to establish care? No .   Dental Screening: Recommended annual dental exams for proper oral hygiene  Community Resource Referral / Chronic Care Management: CRR required this visit?  No   CCM required this visit?  No      Plan:     I have personally reviewed and noted the following in the patient's chart:   Medical and social history Use of alcohol, tobacco or illicit drugs  Current medications and supplements including opioid prescriptions. Patient is not currently taking opioid prescriptions. Functional ability and status Nutritional status Physical activity Advanced directives List of other physicians Hospitalizations, surgeries, and ER visits in previous 12 months Vitals Screenings to include cognitive, depression, and falls Referrals and appointments  In addition, I have reviewed and discussed with patient certain preventive protocols, quality metrics, and best practice recommendations. A written personalized care plan for preventive services as well as general preventive health recommendations were provided to patient.     Willette Brace, LPN   03/16/7901   Nurse Notes: Pt declined 6CIT related loss in the family and was not able to proceed at this time, pt was very knowledgeable to questions asked

## 2022-07-31 DIAGNOSIS — H25812 Combined forms of age-related cataract, left eye: Secondary | ICD-10-CM | POA: Diagnosis not present

## 2022-07-31 DIAGNOSIS — H43811 Vitreous degeneration, right eye: Secondary | ICD-10-CM | POA: Diagnosis not present

## 2022-07-31 DIAGNOSIS — H5213 Myopia, bilateral: Secondary | ICD-10-CM | POA: Diagnosis not present

## 2022-07-31 DIAGNOSIS — Z961 Presence of intraocular lens: Secondary | ICD-10-CM | POA: Diagnosis not present

## 2022-08-21 ENCOUNTER — Other Ambulatory Visit: Payer: Self-pay | Admitting: Family Medicine

## 2022-08-21 DIAGNOSIS — Z1231 Encounter for screening mammogram for malignant neoplasm of breast: Secondary | ICD-10-CM

## 2022-10-20 DIAGNOSIS — H25812 Combined forms of age-related cataract, left eye: Secondary | ICD-10-CM | POA: Diagnosis not present

## 2022-10-20 DIAGNOSIS — H52222 Regular astigmatism, left eye: Secondary | ICD-10-CM | POA: Diagnosis not present

## 2022-10-20 DIAGNOSIS — Z961 Presence of intraocular lens: Secondary | ICD-10-CM | POA: Diagnosis not present

## 2022-10-23 DIAGNOSIS — K219 Gastro-esophageal reflux disease without esophagitis: Secondary | ICD-10-CM | POA: Diagnosis not present

## 2022-10-23 DIAGNOSIS — H25812 Combined forms of age-related cataract, left eye: Secondary | ICD-10-CM | POA: Diagnosis not present

## 2022-10-23 DIAGNOSIS — Z7983 Long term (current) use of bisphosphonates: Secondary | ICD-10-CM | POA: Diagnosis not present

## 2022-10-23 DIAGNOSIS — H52222 Regular astigmatism, left eye: Secondary | ICD-10-CM | POA: Diagnosis not present

## 2022-10-23 DIAGNOSIS — Z9841 Cataract extraction status, right eye: Secondary | ICD-10-CM | POA: Diagnosis not present

## 2022-10-23 DIAGNOSIS — M810 Age-related osteoporosis without current pathological fracture: Secondary | ICD-10-CM | POA: Diagnosis not present

## 2022-10-23 DIAGNOSIS — H527 Unspecified disorder of refraction: Secondary | ICD-10-CM | POA: Diagnosis not present

## 2022-10-23 DIAGNOSIS — Z961 Presence of intraocular lens: Secondary | ICD-10-CM | POA: Diagnosis not present

## 2022-10-28 ENCOUNTER — Ambulatory Visit
Admission: RE | Admit: 2022-10-28 | Discharge: 2022-10-28 | Disposition: A | Discharge status: Home / Self Care | Payer: Medicare Other | Source: Ambulatory Visit | Attending: Family Medicine | Admitting: Family Medicine

## 2022-10-28 DIAGNOSIS — Z1231 Encounter for screening mammogram for malignant neoplasm of breast: Secondary | ICD-10-CM

## 2022-10-29 ENCOUNTER — Telehealth: Payer: Self-pay | Admitting: Family Medicine

## 2022-10-29 NOTE — Telephone Encounter (Signed)
Caller states she faxed patient's DEXA order for it to be signed by PCP. States pt is set to come in tomorrow morning and needs order signed to complete. Form can be faxed back to [phone].

## 2022-10-29 NOTE — Telephone Encounter (Signed)
Pt has been notified that once the DEXA is signed I will fax it off.

## 2022-10-30 ENCOUNTER — Ambulatory Visit
Admission: RE | Admit: 2022-10-30 | Discharge: 2022-10-30 | Disposition: A | Discharge status: Home / Self Care | Payer: Medicare Other | Source: Ambulatory Visit | Attending: Family Medicine | Admitting: Family Medicine

## 2022-10-30 DIAGNOSIS — Z780 Asymptomatic menopausal state: Secondary | ICD-10-CM

## 2022-10-30 DIAGNOSIS — M810 Age-related osteoporosis without current pathological fracture: Secondary | ICD-10-CM | POA: Diagnosis not present

## 2022-10-30 DIAGNOSIS — M85851 Other specified disorders of bone density and structure, right thigh: Secondary | ICD-10-CM | POA: Diagnosis not present

## 2022-11-04 ENCOUNTER — Telehealth: Payer: Self-pay | Admitting: Pharmacy Technician

## 2022-11-19 ENCOUNTER — Encounter: Payer: Self-pay | Admitting: Family Medicine

## 2022-11-19 ENCOUNTER — Ambulatory Visit (INDEPENDENT_AMBULATORY_CARE_PROVIDER_SITE_OTHER): Payer: Medicare Other | Admitting: Family Medicine

## 2022-11-19 VITALS — BP 124/68 | HR 72 | Temp 98.2°F | Ht 66.0 in | Wt 139.4 lb

## 2022-11-19 DIAGNOSIS — M810 Age-related osteoporosis without current pathological fracture: Secondary | ICD-10-CM

## 2022-11-19 DIAGNOSIS — E782 Mixed hyperlipidemia: Secondary | ICD-10-CM

## 2022-11-19 DIAGNOSIS — H9193 Unspecified hearing loss, bilateral: Secondary | ICD-10-CM | POA: Diagnosis not present

## 2022-11-19 LAB — COMPREHENSIVE METABOLIC PANEL
ALT: 13 U/L (ref 0–35)
AST: 17 U/L (ref 0–37)
Albumin: 4.4 g/dL (ref 3.5–5.2)
Alkaline Phosphatase: 68 U/L (ref 39–117)
BUN: 17 mg/dL (ref 6–23)
CO2: 27 mEq/L (ref 19–32)
Calcium: 9.2 mg/dL (ref 8.4–10.5)
Chloride: 108 mEq/L (ref 96–112)
Creatinine, Ser: 0.71 mg/dL (ref 0.40–1.20)
GFR: 84.48 mL/min (ref >60.00)
Glucose, Bld: 64 mg/dL — ABNORMAL LOW (ref 70–99)
Potassium: 4 mEq/L (ref 3.5–5.1)
Sodium: 142 mEq/L (ref 135–145)
Total Bilirubin: 0.5 mg/dL (ref 0.2–1.2)
Total Protein: 6.8 g/dL (ref 6.0–8.3)

## 2022-11-19 LAB — LIPID PANEL
Cholesterol: 150 mg/dL (ref 0–200)
HDL: 59 mg/dL (ref >39.00)
LDL Cholesterol: 74 mg/dL (ref 0–99)
NonHDL: 91.32
Total CHOL/HDL Ratio: 3
Triglycerides: 89 mg/dL (ref 0.0–149.0)
VLDL: 17.8 mg/dL (ref 0.0–40.0)

## 2022-11-19 NOTE — Progress Notes (Signed)
Subjective  CC:  Chief Complaint  Patient presents with   Hearing Problem   Osteoporosis   Hyperlipidemia    HPI: Jenny [name] is a 72 y.o. female who presents to the office today to address the problems listed above in the chief complaint. 72 year old presents to follow-up on some hearing concerns.  She reports that she had very minimal hearing loss when last checked a few years ago with audiology.  Mainly problems with background noise.  However her husband has noticed that he feels he needs to talk louder at times.  Patient has not noticed any worsening of her hearing.  Only at times, when she is watching TV does she feel like she occasionally has a problem.  She has no ear pain. Osteoporosis: Reviewed last bone density showing improvement in osteoporosis at all sites.  She is on year 2 of Prolia.  She will continue.  Next due June 30.  Tolerating well.  Continues to be active, taking vitamin D and calcium Hyperlipidemia with elevated atherosclerotic risk score: Start Lipitor 10 about 6 months ago.  Tolerating well.  Nonfasting for recheck.  No adverse effects Lab Results  Component Value Date   CHOL 235 (H) 05/12/2022   HDL 66.30 05/12/2022   LDLCALC 147 (H) 05/12/2022   LDLDIRECT 117.0 09/21/2018   TRIG 110.0 05/12/2022   CHOLHDL 4 05/12/2022   The 10-year ASCVD risk score (Arnett DK, et al., 2019) is: 12.5%   Values used to calculate the score:     Age: 34 years     Sex: Female     Is Non-Hispanic African American: No     Diabetic: No     Tobacco smoker: No     Systolic Blood Pressure: 124 mmHg     Is BP treated: No     HDL Cholesterol: 66.3 mg/dL     Total Cholesterol: 235 mg/dL  F/u DEXA 07/6107 lowest T = -2.7 at Lspine, significant improvement in all areas: recommend chronic prolia treatment.  Assessment  1. Hearing difficulty of both ears   2. Age-related osteoporosis without current pathological fracture   3. Mixed hyperlipidemia      Plan  Hearing  difficulty: Reassured, hearing screen is normal.  No cerumen impaction.  Normal TMs. Osteoporosis: Improving.  Continue Prolia.  Patient will return within the next month for her next injection. Hyperlipidemia now tolerating Lipitor 10 mg nightly.  Check liver function test and lipid panel, nonfasting  Follow up: October for complete physical Visit date not found  Orders Placed This Encounter  Procedures   Lipid panel   Comprehensive metabolic panel   No orders of the defined types were placed in this encounter.     I reviewed the patients updated PMH, FH, and SocHx.    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    Priority: Medium    Adjustment disorder with anxious mood 04/09/2021    Priority: Medium    Laryngopharyngeal reflux (LPR) 09/01/2017    Priority: Medium    Family history of Alzheimer's disease 10/30/2014    Priority: Medium    GERD (gastroesophageal reflux disease) 07/25/2013    Priority: Medium    Heart murmur 04/09/2021    Priority: Low   Essential tremor 09/21/2018    Priority: Low   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   CALCIUM PO Take by mouth.   famotidine (PEPCID) 20 MG tablet One at bedtime   FLUZONE HIGH-DOSE QUADRIVALENT 0.7 ML SUSY    Multiple Vitamin (MULTIVITAMIN PO) Take 1 tablet by mouth daily. Centrum Silver   VITAMIN D PO Take by mouth.    Allergies: Patient is allergic to codeine, hydrocodeine [dihydrocodeine], and fosamax [alendronate sodium]. Family History: Patient family history includes Alzheimer's disease in her maternal grandmother and mother; Arthritis in her brother and brother;  Cancer in her father, maternal grandmother, and maternal uncle; Colon cancer in her maternal grandfather and maternal uncle; Healthy in her daughter and son; Heart disease in her mother; Hyperlipidemia in her mother; Hypertension in her mother; Lung cancer in her maternal aunt; Pulmonary embolism in her brother and mother. Social History:  Patient  reports that she has never smoked. She has never used smokeless tobacco. She reports that she does not drink alcohol and does not use drugs.  Review of Systems: Constitutional: Negative for fever malaise or anorexia Cardiovascular: negative for chest pain Respiratory: negative for SOB or persistent cough Gastrointestinal: negative for abdominal pain  Objective  Vitals: BP 124/68   Pulse 72   Temp 98.2 F (36.8 C)   Ht 5\' 6"  (1.676 m)   Wt 139 lb 6.4 oz (63.2 kg)   SpO2 96%   BMI 22.50 kg/m  General: no acute distress , A&Ox3 HEENT: PEERL, conjunctiva normal, neck is supple, TMs normal bilaterally with some nonobstructive wax   Commons side effects, risks, benefits, and alternatives for medications and treatment plan prescribed today were discussed, and the patient expressed understanding of the given instructions. Patient is instructed to call or message via MyChart if he/she has any questions or concerns regarding our treatment plan. No barriers to understanding were identified. We discussed Red Flag symptoms and signs in detail. Patient expressed understanding regarding what to do in case of urgent or emergency type symptoms.  Medication list was reconciled, printed and provided to the patient in AVS. Patient instructions and summary information was reviewed with the patient as documented in the AVS. This note was prepared with assistance of Dragon voice recognition software. Occasional wrong-word or sound-a-like substitutions may have occurred due to the inherent limitations of voice recognition software

## 2022-11-19 NOTE — Patient Instructions (Signed)
Please return in October for your annual complete physical; please come fasting.  Your prolia injection is due at the end of June; you may schedule.   If you have any questions or concerns, please don't hesitate to send me a message via MyChart or call the office at [phone]. Thank you for visiting with [place] today! It's our pleasure caring for you.   I will release your lab results to you on your MyChart account with further instructions. You may see the results before I do, but when I review them I will send you a message with my report or have my assistant call you if things need to be discussed. Please reply to my message with any questions. Thank you!

## 2022-12-09 ENCOUNTER — Other Ambulatory Visit (HOSPITAL_COMMUNITY): Payer: Self-pay

## 2022-12-09 NOTE — Telephone Encounter (Signed)
Pt ready for scheduling for Prolia on or after : 01/01/23  Out-of-pocket cost due at time of visit: $322  Primary: [employer] - Medicare Prolia co-insurance: 20% Admin fee co-insurance: $20  Secondary: N/A Prolia co-insurance:  Admin fee co-insurance:   Medical Benefit Details: Date Benefits were checked: 12/02/22 Deductible: no/ Coinsurance: 20%/ Admin Fee: $20  Prior Auth: approved PA# [id number] Expiration Date: 12/09/22 to 12/09/23   Pharmacy benefit: Copay $300 If patient wants fill through the pharmacy benefit please send prescription to:  [facility] , and include estimated need by date in rx notes. Pharmacy will ship medication directly to the office.  Patient NOT eligible for Prolia Copay Card. Copay Card can make patient's cost as little as $25. Link to apply: [email or web address]://[email or web address].[email or web address]/copay  ** This summary of benefits is an estimation of the patient's out-of-pocket cost. Exact cost may very based on individual plan coverage.

## 2022-12-09 NOTE — Telephone Encounter (Signed)
Called to schedule pt and informed her of out of pocket cost. Pt verbalized understanding and stated would callback for scheduling.

## 2022-12-09 NOTE — Telephone Encounter (Signed)
Patient Advocate Encounter  Prior Authorization for Jenny [name] has been approved with [employer].    PA# [id number] Effective dates: 12/09/22 through 12/09/23  Per [facility] test claim, copay for 180 days supply is $300

## 2023-04-30 ENCOUNTER — Telehealth: Payer: Self-pay | Admitting: Family Medicine

## 2023-04-30 NOTE — Telephone Encounter (Signed)
err

## 2023-05-14 DIAGNOSIS — M7061 Trochanteric bursitis, right hip: Secondary | ICD-10-CM | POA: Diagnosis not present

## 2023-05-21 ENCOUNTER — Ambulatory Visit: Payer: Medicare Other | Admitting: Family Medicine

## 2023-05-21 ENCOUNTER — Encounter: Payer: Self-pay | Admitting: Family Medicine

## 2023-05-21 VITALS — BP 116/78 | HR 73 | Temp 97.8°F | Ht 66.0 in | Wt 140.2 lb

## 2023-05-21 DIAGNOSIS — M4722 Other spondylosis with radiculopathy, cervical region: Secondary | ICD-10-CM | POA: Diagnosis not present

## 2023-05-21 DIAGNOSIS — K219 Gastro-esophageal reflux disease without esophagitis: Secondary | ICD-10-CM

## 2023-05-21 DIAGNOSIS — E782 Mixed hyperlipidemia: Secondary | ICD-10-CM | POA: Diagnosis not present

## 2023-05-21 DIAGNOSIS — M810 Age-related osteoporosis without current pathological fracture: Secondary | ICD-10-CM

## 2023-05-21 DIAGNOSIS — Z0001 Encounter for general adult medical examination with abnormal findings: Secondary | ICD-10-CM | POA: Diagnosis not present

## 2023-05-21 DIAGNOSIS — Z0000 Encounter for general adult medical examination without abnormal findings: Secondary | ICD-10-CM

## 2023-05-21 LAB — CBC WITH DIFFERENTIAL/PLATELET
Basophils Absolute: 0.1 10*3/uL (ref 0.0–0.1)
Basophils Relative: 1.4 % (ref 0.0–3.0)
Eosinophils Absolute: 0.1 10*3/uL (ref 0.0–0.7)
Eosinophils Relative: 1.9 % (ref 0.0–5.0)
HCT: 39.5 % (ref 36.0–46.0)
Hemoglobin: 13.2 g/dL (ref 12.0–15.0)
Lymphocytes Relative: 38.2 % (ref 12.0–46.0)
Lymphs Abs: 2.1 10*3/uL (ref 0.7–4.0)
MCHC: 33.4 g/dL (ref 30.0–36.0)
MCV: 89.5 fL (ref 78.0–100.0)
Monocytes Absolute: 0.6 10*3/uL (ref 0.1–1.0)
Monocytes Relative: 10.4 % (ref 3.0–12.0)
Neutro Abs: 2.6 10*3/uL (ref 1.4–7.7)
Neutrophils Relative %: 48.1 % (ref 43.0–77.0)
Platelets: 246 10*3/uL (ref 150.0–400.0)
RBC: 4.42 Mil/uL (ref 3.87–5.11)
RDW: 13.3 % (ref 11.5–15.5)
WBC: 5.5 10*3/uL (ref 4.0–10.5)

## 2023-05-21 LAB — COMPREHENSIVE METABOLIC PANEL
ALT: 13 U/L (ref 0–35)
AST: 16 U/L (ref 0–37)
Albumin: 4.8 g/dL (ref 3.5–5.2)
Alkaline Phosphatase: 119 U/L — ABNORMAL HIGH (ref 39–117)
BUN: 22 mg/dL (ref 6–23)
CO2: 28 meq/L (ref 19–32)
Calcium: 10.6 mg/dL — ABNORMAL HIGH (ref 8.4–10.5)
Chloride: 103 meq/L (ref 96–112)
Creatinine, Ser: 0.86 mg/dL (ref 0.40–1.20)
GFR: 66.88 mL/min (ref >60.00)
Glucose, Bld: 90 mg/dL (ref 70–99)
Potassium: 4.7 meq/L (ref 3.5–5.1)
Sodium: 138 meq/L (ref 135–145)
Total Bilirubin: 0.5 mg/dL (ref 0.2–1.2)
Total Protein: 7.6 g/dL (ref 6.0–8.3)

## 2023-05-21 LAB — LIPID PANEL
Cholesterol: 164 mg/dL (ref 0–200)
HDL: 68.3 mg/dL
LDL Cholesterol: 86 mg/dL (ref 0–99)
NonHDL: 95.98
Total CHOL/HDL Ratio: 2
Triglycerides: 51 mg/dL (ref 0.0–149.0)
VLDL: 10.2 mg/dL (ref 0.0–40.0)

## 2023-05-21 LAB — VITAMIN D 25 HYDROXY (VIT D DEFICIENCY, FRACTURES): VITD: 60.39 ng/mL (ref 30.00–100.00)

## 2023-05-21 LAB — TSH: TSH: 4.53 u[IU]/mL (ref 0.35–5.50)

## 2023-05-21 MED ORDER — ATORVASTATIN CALCIUM 10 MG PO TABS: 10.0000 mg | ORAL_TABLET | Freq: Every evening | ORAL | 3 refills | Status: AC

## 2023-05-21 NOTE — Progress Notes (Signed)
Subjective  Chief Complaint  Patient presents with   Annual Exam    Pt here for Annual Exam and is not currently fasting    osteoprosis    HPI: Jenny [name] is a 72 y.o. female who presents to [facility] [place] today for a Female Wellness Visit. She also has the concerns and/or needs as listed above in the chief complaint. These will be addressed in addition to the Health Maintenance Visit.   Wellness Visit: annual visit with health maintenance review and exam  HM: screens are all current. Very active and doing well overall. Will get flu shot next week: defers today due to beach trip upcoming. Chronic disease f/u and/or acute problem visit: (deemed necessary to be done in addition to the wellness visit): Discussed the use of AI scribe software for clinical note transcription with the patient, who gave verbal consent to proceed.  History of Present Illness   The patient, with a history of hyperlipidemia and osteoporosis, reports good tolerance to Lipitor and has seen improvement in cholesterol levels since the last check in May. However, the patient expresses financial concerns regarding the cost of Prolia, a medication prescribed for osteoporosis. Despite the medication's effectiveness in improving bone density, the patient has been unable to consistently afford it, leading to missed doses.  The patient also reports a history of numbness on one side of the body, which led to an MRI and consultation with a spine specialist. A minor abnormality was found in the neck, but it was deemed not severe enough for intervention. The patient has since experienced intermittent leg weakness and sharp pain in the hip and tailbone area. The pain is sometimes severe enough to cause the leg to give way, but usually resolves after a short period of walking. The patient has sought help for these symptoms, including a cortisone shot for suspected bursitis, but has not found relief.      GERD is well controlled on pepcid  OA lumbar: as noted above, had radicular sxs  HLD ON statin well tolerated.  Assessment  1. Encounter for well adult exam with abnormal findings   2. Mixed hyperlipidemia   3. Age-related osteoporosis without current pathological fracture   4. Gastroesophageal reflux disease without esophagitis      Plan  Female Wellness Visit: Age appropriate Health Maintenance and Prevention measures were discussed with patient. Included topics are cancer screening recommendations, ways to keep healthy (see AVS) including dietary and exercise recommendations, regular eye and dental care, use of seat belts, and avoidance of moderate alcohol use and tobacco use.  Dexa repeat next year.  BMI: discussed patient's BMI and encouraged positive lifestyle modifications to help get to or maintain a target BMI. HM needs and immunizations were addressed and ordered. See below for orders. See HM and immunization section for updates. Flu shot next week Routine labs and screening tests ordered including cmp, cbc and lipids where appropriate. Discussed recommendations regarding Vit D and calcium supplementation (see AVS)  Chronic disease management visit and/or acute problem visit: Assessment and Plan    Leg Pain and Numbness Intermittent right leg pain and numbness, possibly due to mechanical impingement or tendon issues. Previous MRI showed minor cervical findings. Symptoms include sharp hip pain and numbness when sitting or lying down. Differential diagnosis includes bursitis, arthritis, or mechanical impingement. Previous cortisone injection for bursitis was ineffective. - Review neurosurgeon records - Consider further evaluation if symptoms persist - Consider hip x-rays for arthritis evaluation  Osteoporosis Osteoporosis managed with Prolia, showing improved bone density. Financial difficulties affording medication. Discussed importance of consistent treatment and potential  financial assistance programs. Advised against intermittent Prolia use due to negative impact on bone health. - Reassess Prolia cost and insurance coverage in January - Explore patient assistance programs on Prolia's website  Hyperlipidemia Hyperlipidemia well-controlled on Lipitor 10mg  po qhs. Recent cholesterol levels were good. Plan to monitor liver function tests annually. - Order lipid panel - Order liver function tests  General Health Maintenance Due for flu vaccination. Discussed importance and suggested getting it after their beach trip. - Get flu shot at local pharmacy after beach trip  Follow-up - Schedule annual physical for November next year - Provide reminder for annual physical.      Orders Placed This Encounter  Procedures   CBC with Differential/Platelet   Comp Met (CMET)   Lipid Profile   TSH   VITAMIN D 25 Hydroxy (Vit-D Deficiency, Fractures)   Meds ordered this encounter  Medications   atorvastatin (LIPITOR) 10 MG tablet    Sig: Take 1 tablet (10 mg total) by mouth at bedtime.    Dispense:  90 tablet    Refill:  3      Body mass index is 22.63 kg/m. Wt Readings from Last 3 Encounters:  05/21/23 140 lb 3.2 oz (63.6 kg)  11/19/22 139 lb 6.4 oz (63.2 kg)  07/22/22 137 lb (62.1 kg)     Patient Active Problem List   Diagnosis Date Noted Date Diagnosed   Mixed hyperlipidemia 04/11/2021     Priority: High    Offered statin 2022, started lipitor 05/2022    Osteoporosis 06/17/2011     Priority: High    By dexa in 2009  -2.8 ls spine needs fu.  Apparently had se of fosmax in the past. Dexa 05/2020 lowest T = -3.3, [facility]; osteoporosis, needs treatment. Failed fosamax. Will rec prolia, vit d and calcium. Started prolia and completed 2 years; F/u DEXA 10/2022 lowest T = -2.7 at [facility], significant improvement in all areas: recommend chronic prolia treatment. Message sent.    Osteoarthritis of spine with radiculopathy, cervical region 05/12/2022      Priority: Medium    Adjustment disorder with anxious mood 04/09/2021     Priority: Medium     Husband dxd with bladder cancer in jan 2022.    Laryngopharyngeal reflux (LPR) 09/01/2017     Priority: Medium     Had eval by GI and pulm    Family history of Alzheimer's disease 10/30/2014     Priority: Medium    GERD (gastroesophageal reflux disease) 07/25/2013     Priority: Medium     EGD and colonoscopy Dr. 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    Rotator cuff syndrome of right shoulder 11/17/2018    Health Maintenance  Topic Date Due   COVID-19 Vaccine (3 - 2023-24 season) 06/06/2023 (Originally 03/15/2023)   INFLUENZA VACCINE  10/12/2023 (Originally 02/12/2023)   Medicare Annual Wellness (AWV)  07/23/2023   MAMMOGRAM  10/28/2023   DEXA SCAN  10/29/2024   Colonoscopy  07/15/2027   DTaP/Tdap/Td (3 - Td or Tdap) 09/19/2027   Pneumonia Vaccine 73+ Years old  Completed   Hepatitis C Screening  Completed   Zoster Vaccines- Shingrix  Completed   HPV VACCINES  Aged Out  Immunization History  Administered Date(s) Administered   Fluad Quad(high Dose 65+) 03/20/2020, 04/09/2021, 05/07/2022   Influenza Split 04/22/2012, 04/24/2013   Influenza Whole 04/13/2009   Influenza, High Dose Seasonal PF 04/13/2017, 03/28/2018, 03/28/2019   Influenza-Unspecified 05/03/2014, 04/17/2016   PFIZER(Purple Top)SARS-COV-2 Vaccination 08/27/2019, 09/21/2019   Pneumococcal Conjugate-13 10/30/2014   Pneumococcal Polysaccharide-23 09/01/2017   Td 07/14/2006   Tdap 09/18/2017   Zoster Recombinant(Shingrix) 01/27/2018, 05/12/2018   Zoster, Live 08/13/2012   We updated and reviewed the patient's past history in detail and it is documented below. Allergies: Patient is allergic to codeine, hydrocodeine [dihydrocodeine], and fosamax [alendronate sodium]. Past Medical  History Patient  has a past medical history of Essential tremor (09/21/2018), GERD (gastroesophageal reflux disease), Laryngopharyngeal reflux (LPR) (09/01/2017), Mixed hyperlipidemia (04/11/2021), and Osteoporosis. Past Surgical History Patient  has a past surgical history that includes Tonsillectomy and Colonoscopy. Family History: Patient family history includes Alzheimer's disease in her maternal grandmother and mother; Arthritis in her brother and brother; Cancer in her father, maternal grandmother, and maternal uncle; Colon cancer in her maternal grandfather and maternal uncle; Healthy in her daughter and son; Heart disease in her mother; Hyperlipidemia in her mother; Hypertension in her mother; Lung cancer in her maternal aunt; Pulmonary embolism in her brother and mother. Social History:  Patient  reports that she has never smoked. She has never used smokeless tobacco. She reports that she does not drink alcohol and does not use drugs.  Review of Systems: Constitutional: negative for fever or malaise Ophthalmic: negative for photophobia, double vision or loss of vision Cardiovascular: negative for chest pain, dyspnea on exertion, or new LE swelling Respiratory: negative for SOB or persistent cough Gastrointestinal: negative for abdominal pain, change in bowel habits or melena Genitourinary: negative for dysuria or gross hematuria, no abnormal uterine bleeding or disharge Musculoskeletal: negative for new gait disturbance or muscular weakness Integumentary: negative for new or persistent rashes, no breast lumps Neurological: negative for TIA or stroke symptoms Psychiatric: negative for SI or delusions Allergic/Immunologic: negative for hives  Patient Care Team    Relationship Specialty Notifications Start End  Willow Ora, MD PCP - General Family Medicine  09/01/17   Meryl Dare, MD  Gastroenterology  06/16/11    Comment: ? vs Jonn Shingles, MD Consulting Physician  Pulmonary Disease  09/01/17     Objective  Vitals: BP 116/78   Pulse 73   Temp 97.8 F (36.6 C)   Ht 5\' 6"  (1.676 m)   Wt 140 lb 3.2 oz (63.6 kg)   SpO2 96%   BMI 22.63 kg/m  General:  Well developed, well nourished, no acute distress  Psych:  Alert and orientedx3,normal mood and affect HEENT:  Normocephalic, atraumatic, non-icteric sclera,  supple neck without adenopathy, mass or thyromegaly Cardiovascular:  Normal S1, S2, RRR without gallop, rub or murmur Respiratory:  Good breath sounds bilaterally, CTAB with normal respiratory effort Gastrointestinal: normal bowel sounds, soft, non-tender, no noted masses. No HSM MSK: extremities without edema, joints without erythema or swelling Neurologic:    Mental status is normal.  Gross motor and sensory exams are normal.  No tremor  Commons side effects, risks, benefits, and alternatives for medications and treatment plan prescribed today were discussed, and the patient expressed understanding of the given instructions. Patient is instructed to call or message via MyChart if he/she has any questions or concerns regarding our treatment plan. No barriers to understanding were identified. We discussed Red Flag symptoms and signs in  detail. Patient expressed understanding regarding what to do in case of urgent or emergency type symptoms.  Medication list was reconciled, printed and provided to the patient in AVS. Patient instructions and summary information was reviewed with the patient as documented in the AVS. This note was prepared with assistance of Dragon voice recognition software. Occasional wrong-word or sound-a-like substitutions may have occurred due to the inherent limitations of voice recognition software

## 2023-05-21 NOTE — Patient Instructions (Signed)
Please return in 12 months for your annual complete physical; please come fasting.   I will release your lab results to you on your MyChart account with further instructions. You may see the results before I do, but when I review them I will send you a message with my report or have my assistant call you if things need to be discussed. Please reply to my message with any questions. Thank you!   If you have any questions or concerns, please don't hesitate to send me a message via MyChart or call the office at [phone]. Thank you for visiting with [place] today! It's our pleasure caring for you.   VISIT SUMMARY:  During today's visit, we discussed your ongoing health concerns, including leg pain and numbness, osteoporosis, and hyperlipidemia. We reviewed your current medications and addressed your financial concerns regarding Prolia. We also talked about general health maintenance and upcoming vaccinations.  YOUR PLAN:  -LEG PAIN AND NUMBNESS: Your intermittent leg pain and numbness may be due to mechanical issues or tendon problems. We will review your neurosurgeon records and consider further evaluation if your symptoms persist. We may also consider hip x-rays to check for arthritis.  -OSTEOPOROSIS: Osteoporosis is a condition where bones become weak and brittle. Your bone density has improved with Prolia, but you have had trouble affording the medication. We discussed the importance of consistent treatment and potential financial assistance programs. We will reassess the cost and insurance coverage for Prolia in January and explore patient assistance programs.  -HYPERLIPIDEMIA: Hyperlipidemia is a condition where there are high levels of fats in the blood. Your cholesterol levels are well-controlled with Lipitor. We will continue to monitor your liver function annually and have ordered a lipid panel and liver function tests.  -GENERAL HEALTH MAINTENANCE: You are due for a flu vaccination. We  discussed the importance of getting the flu shot and suggested you get it at your local pharmacy after your beach trip.  INSTRUCTIONS:  Please schedule your annual physical for November next year. Also, remember to get your flu shot at the local pharmacy after your beach trip.

## 2023-06-04 ENCOUNTER — Encounter: Payer: Self-pay | Admitting: Family Medicine

## 2023-06-04 DIAGNOSIS — M79606 Pain in leg, unspecified: Secondary | ICD-10-CM | POA: Insufficient documentation

## 2023-06-04 NOTE — Progress Notes (Signed)
Please call patient: lab results are mostly all normal. Vit d level is good. Kidny and liver function and blood cell counts and thyroid are all normal.  Minimal elevations in alk phosphatase and calcium; both just above the normal limit. Both are non specific finding.  Given her leg pain, please get records and names of her neurosurgeon and/or orthopedist. I will keep an eye on the blood levels as well. Nothing further is needed at this time. However, I would like to recheck levels in 6 months if pain persists. Please ask her to schedule if needed.

## 2023-06-05 ENCOUNTER — Other Ambulatory Visit: Payer: Self-pay | Admitting: Family Medicine

## 2023-07-28 ENCOUNTER — Ambulatory Visit (INDEPENDENT_AMBULATORY_CARE_PROVIDER_SITE_OTHER): Payer: Medicare Other

## 2023-07-28 VITALS — Wt 140.0 lb

## 2023-07-28 DIAGNOSIS — Z0000 Encounter for general adult medical examination without abnormal findings: Secondary | ICD-10-CM | POA: Diagnosis not present

## 2023-07-28 NOTE — Progress Notes (Signed)
 Subjective:   Jenny [name] is a 73 y.o. female who presents for Medicare Annual (Subsequent) preventive examination.  Visit Complete: Virtual I connected with  Jenny [name] on 07/28/23 by a audio enabled telemedicine application and verified that I am speaking with the correct person using two identifiers.  Patient Location: Home  Provider Location: Office/Clinic  I discussed the limitations of evaluation and management by telemedicine. The patient expressed understanding and agreed to proceed.  Vital Signs: Because this visit was a virtual/telehealth visit, some criteria may be missing or patient reported. Any vitals not documented were not able to be obtained and vitals that have been documented are patient reported.  Cardiac Risk Factors include: advanced age (>25men, >85 women);dyslipidemia     Objective:    Today's Vitals   07/28/23 1002  Weight: 140 lb (63.5 kg)   Body mass index is 22.6 kg/m.     07/28/2023   10:06 AM 07/22/2022   10:11 AM 07/08/2021    1:40 PM 08/31/2019    3:42 PM 09/09/2018    8:26 AM 09/08/2017    9:17 AM  Advanced Directives  Does Patient Have a Medical Advance Directive? Yes Yes No;Yes No Yes Yes  Type of Estate Agent of [place];Living will Healthcare Power of [place];Living will Living will;Healthcare Power of Attorney  Living will;Healthcare Power of Attorney Living will;Healthcare Power of Attorney  Copy of Healthcare Power of Attorney in Chart? No - copy requested No - copy requested   Yes - validated most recent copy scanned in chart (See row information) No - copy requested  Would patient like information on creating a medical advance directive?    No - Patient declined      Current Medications (verified) Outpatient Encounter Medications as of 07/28/2023  Medication Sig   atorvastatin  (LIPITOR) 10 MG tablet Take 1 tablet (10 mg total) by mouth at bedtime.   CALCIUM  PO Take by mouth.   famotidine  (PEPCID )  20 MG tablet One at bedtime   Multiple Vitamin (MULTIVITAMIN PO) Take 1 tablet by mouth daily. Centrum Silver   VITAMIN D  PO Take by mouth.   No facility-administered encounter medications on file as of 07/28/2023.    Allergies (verified) Codeine, Hydrocodeine [dihydrocodeine], and Fosamax [alendronate sodium]   History: Past Medical History:  Diagnosis Date   Essential tremor 09/21/2018   GERD (gastroesophageal reflux disease)    Laryngopharyngeal reflux (LPR) 09/01/2017   Had eval by GI and pulm   Mixed hyperlipidemia 04/11/2021   Offered statin 2022   Osteoporosis    by dexa 2009 had se of fosamax   Past Surgical History:  Procedure Laterality Date   COLONOSCOPY     TONSILLECTOMY     age 26   Family History  Problem Relation Age of Onset   Hyperlipidemia Mother    Heart disease Mother    Hypertension Mother    Alzheimer's disease Mother    Pulmonary embolism Mother    Cancer Father        unsure of type   Pulmonary embolism Brother    Arthritis Brother    Arthritis Brother    Alzheimer's disease Maternal Grandmother    Cancer Maternal Grandmother    Healthy Daughter    Healthy Son    Lung cancer Maternal Aunt        non smoker   Cancer Maternal Uncle    Colon cancer Maternal Uncle    Colon cancer Maternal Grandfather    Social  History   Socioeconomic History   Marital status: Married    Spouse name: Marcey   Number of children: 2   Years of education: Not on file   Highest education level: Not on file  Occupational History   Occupation: retired - sewer, social research officer, government estate book  Tobacco Use   Smoking status: Never   Smokeless tobacco: Never  Vaping Use   Vaping status: Never Used  Substance and Sexual Activity   Alcohol use: No    Alcohol/week: 0.0 standard drinks of alcohol   Drug use: No   Sexual activity: Not Currently    Birth control/protection: Post-menopausal  Other Topics Concern   Not on file  Social History Narrative   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    Married   Regular exercise- yes   8 hours of sleep originally from [employer]   Social Drivers of [employer]: Low Risk  (07/28/2023)   Overall Financial Resource Strain (CARDIA)    Difficulty of Paying Living Expenses: Not hard at all  Food Insecurity: No Food Insecurity (07/28/2023)   Hunger Vital Sign    Worried About Running Out of Food in the Last Year: Never true    Ran Out of Food in the Last Year: Never true  Transportation Needs: No Transportation Needs (07/28/2023)   PRAPARE - Administrator, Civil Service (Medical): No    Lack of Transportation (Non-Medical): No  Physical Activity: Sufficiently Active (07/28/2023)   Exercise Vital Sign    Days of Exercise per Week: 5 days    Minutes of Exercise per Session: 30 min  Stress: No Stress Concern Present (07/28/2023)   [employer] of Occupational Health - Occupational Stress Questionnaire    Feeling of Stress : Not at all  Social Connections: Socially Integrated (07/28/2023)   Social Connection and Isolation Panel [NHANES]    Frequency of Communication with Friends and Family: More than three times a week    Frequency of Social Gatherings with Friends and Family: More than three times a week    Attends Religious Services: More than 4 times per year    Active Member of [employer] or Organizations: Yes    Attends Banker Meetings: 1 to 4 times per year    Marital Status: Married    Tobacco Counseling Counseling given: Not Answered   Clinical Intake:  Pre-visit preparation completed: Yes  Pain : No/denies pain     BMI - recorded: 22.6 Nutritional Status: BMI of 19-24  Normal Nutritional Risks: None Diabetes: No  How often do you need to have someone help you when you read instructions, pamphlets, or other written materials from your doctor or pharmacy?: 1 - Never  Interpreter Needed?: No  Information entered by  :: Ellouise Haws, LPN   Activities of Daily Living    07/28/2023   10:03 AM  In your present state of health, do you have any difficulty performing the following activities:  Hearing? 0  Vision? 0  Difficulty concentrating or making decisions? 0  Walking or climbing stairs? 0  Dressing or bathing? 0  Doing errands, shopping? 0  Preparing Food and eating ? N  Using the Toilet? N  In the past six months, have you accidently leaked urine? N  Do you have problems with loss of bowel control? N  Managing your Medications? N  Managing your Finances? N  Housekeeping or managing your  Housekeeping? N    Patient Care Team: Jodie Lavern CROME, MD as PCP - General (Family Medicine) Aneita Gwendlyn DASEN, MD (Inactive) (Gastroenterology) Darlean Ozell NOVAK, MD as Consulting Physician (Pulmonary Disease)  Indicate any recent Medical Services you may have received from other than [facility] providers in the past year (date may be approximate).     Assessment:   This is a routine wellness examination for Jenny [name].  Hearing/Vision screen Hearing Screening - Comments:: Pt denies any hearing issues  Vision Screening - Comments:: Pt follows up with my eye dr for annual eye exams    Goals Addressed             This Visit's Progress    Patient Stated       Maintain health and activity        Depression Screen    07/28/2023   10:05 AM 05/21/2023    8:02 AM 11/19/2022    9:55 AM 07/22/2022   10:09 AM 05/12/2022   10:05 AM 04/09/2021    9:25 AM 03/05/2020   10:05 AM  PHQ 2/9 Scores  PHQ - 2 Score 0 0 0 0 0 0 0    Fall Risk    07/28/2023   10:06 AM 05/21/2023    8:02 AM 11/19/2022    9:55 AM 07/22/2022   10:12 AM 05/12/2022   10:05 AM  Fall Risk   Falls in the past year? 0 0 0 0 0  Number falls in past yr: 0 0 0 0 0  Injury with Fall? 0 0 0 0 0  Risk for fall due to : No Fall Risks No Fall Risks No Fall Risks Impaired vision No Fall Risks  Follow up Falls prevention discussed Falls evaluation  completed Falls evaluation completed Falls prevention discussed Falls evaluation completed    MEDICARE RISK AT HOME: Medicare Risk at Home Any stairs in or around the home?: Yes If so, are there any without handrails?: No Home free of loose throw rugs in walkways, pet beds, electrical cords, etc?: Yes Adequate lighting in your home to reduce risk of falls?: Yes Life alert?: No Use of a cane, walker or w/c?: No Grab bars in the bathroom?: Yes Shower chair or bench in shower?: Yes Elevated toilet seat or a handicapped toilet?: No  TIMED UP AND GO:  Was the test performed?  No    Cognitive Function:    09/09/2018    8:26 AM  MMSE - Mini Mental State Exam  Orientation to time 5  Orientation to Place 5  Registration 3  Attention/ Calculation 5  Recall 3  Language- name 2 objects 2  Language- repeat 1  Language- follow 3 step command 3  Language- read & follow direction 1  Write a sentence 1  Copy design 1  Total score 30        07/28/2023   10:07 AM  6CIT Screen  What Year? 0 points  What month? 0 points  What time? 0 points  Count back from 20 0 points  Months in reverse 0 points  Repeat phrase 0 points  Total Score 0 points    Immunizations Immunization History  Administered Date(s) Administered   Fluad Quad(high Dose 65+) 03/20/2020, 04/09/2021, 05/07/2022   Influenza Split 04/22/2012, 04/24/2013   Influenza Whole 04/13/2009   Influenza, High Dose Seasonal PF 04/13/2017, 03/28/2018, 03/28/2019   Influenza-Unspecified 05/03/2014, 04/17/2016, 07/17/2023   PFIZER(Purple Top)SARS-COV-2 Vaccination 08/27/2019, 09/21/2019   Pneumococcal Conjugate-13 10/30/2014   Pneumococcal Polysaccharide-23 09/01/2017  Td 07/14/2006   Tdap 09/18/2017   Zoster Recombinant(Shingrix ) 01/27/2018, 05/12/2018   Zoster, Live 08/13/2012    TDAP status: Up to date  Flu Vaccine status: Up to date  Pneumococcal vaccine status: Up to date  Covid-19 vaccine status: Information  provided on how to obtain vaccines.   Qualifies for Shingles Vaccine? Yes   Zostavax completed Yes   Shingrix  Completed?: Yes  Screening Tests Health Maintenance  Topic Date Due   COVID-19 Vaccine (3 - 2024-25 season) 03/15/2023   MAMMOGRAM  10/28/2023   Medicare Annual Wellness (AWV)  07/27/2024   DEXA SCAN  10/29/2024   Colonoscopy  07/15/2027   DTaP/Tdap/Td (3 - Td or Tdap) 09/19/2027   Pneumonia Vaccine 11+ Years old  Completed   INFLUENZA VACCINE  Completed   Hepatitis C Screening  Completed   Zoster Vaccines- Shingrix   Completed   HPV VACCINES  Aged Out    Health Maintenance  Health Maintenance Due  Topic Date Due   COVID-19 Vaccine (3 - 2024-25 season) 03/15/2023    Colorectal cancer screening: Type of screening: Colonoscopy. Completed 07/14/17. Repeat every 10 years  Mammogram status: Completed 10/28/22. Repeat every year  Bone Density status: Completed 10/30/22. Results reflect: Bone density results: OSTEOPOROSIS. Repeat every 2 years.   Additional Screening:  Hepatitis C Screening:  Completed 09/08/17  Vision Screening: Recommended annual ophthalmology exams for early detection of glaucoma and other disorders of the eye. Is the patient up to date with their annual eye exam?  Yes  Who is the provider or what is the name of the office in which the patient attends annual eye exams? [facility]  If pt is not established with a provider, would they like to be referred to a provider to establish care? No .   Dental Screening: Recommended annual dental exams for proper oral hygiene  Community Resource Referral / Chronic Care Management: CRR required this visit?  No   CCM required this visit?  No     Plan:     I have personally reviewed and noted the following in the patient's chart:   Medical and social history Use of alcohol, tobacco or illicit drugs  Current medications and supplements including opioid prescriptions. Patient is not currently taking opioid  prescriptions. Functional ability and status Nutritional status Physical activity Advanced directives List of other physicians Hospitalizations, surgeries, and ER visits in previous 12 months Vitals Screenings to include cognitive, depression, and falls Referrals and appointments  In addition, I have reviewed and discussed with patient certain preventive protocols, quality metrics, and best practice recommendations. A written personalized care plan for preventive services as well as general preventive health recommendations were provided to patient.     Ellouise VEAR Haws, LPN   [id number]   After Visit Summary: (MyChart) Due to this being a telephonic visit, the after visit summary with patients personalized plan was offered to patient via MyChart   Nurse Notes: preference telephonic

## 2023-07-28 NOTE — Patient Instructions (Signed)
 Jenny [name] , Thank you for taking time to come for your Medicare Wellness Visit. I appreciate your ongoing commitment to your health goals. Please review the following plan we discussed and let me know if I can assist you in the future.   Referrals/Orders/Follow-Ups/Clinician Recommendations: maintain health and activity  Aim for 30 minutes of exercise or brisk walking, 6-8 glasses of water, and 5 servings of fruits and vegetables each day.   This is a list of the screening recommended for you and due dates:  Health Maintenance  Topic Date Due   COVID-19 Vaccine (3 - 2024-25 season) 03/15/2023   Medicare Annual Wellness Visit  07/23/2023   Flu Shot  10/12/2023*   Mammogram  10/28/2023   DEXA scan (bone density measurement)  10/29/2024   Colon Cancer Screening  07/15/2027   DTaP/Tdap/Td vaccine (3 - Td or Tdap) 09/19/2027   Pneumonia Vaccine  Completed   Hepatitis C Screening  Completed   Zoster (Shingles) Vaccine  Completed   HPV Vaccine  Aged Out  *Topic was postponed. The date shown is not the original due date.    Advanced directives: (Copy Requested) Please bring a copy of your health care power of attorney and living will to the office to be added to your chart at your convenience.  Next Medicare Annual Wellness Visit scheduled for next year: Yes  Interactive audio and video telecommunications were attempted between this provider and patient, however failed, due to patient having technical difficulties OR patient did not have access to video capability.  We continued and completed visit with audio only.

## 2023-09-02 ENCOUNTER — Ambulatory Visit: Payer: Self-pay | Admitting: Family Medicine

## 2023-09-02 NOTE — Telephone Encounter (Signed)
Chief Complaint: Urinary symptoms Symptoms: Urinary frequency, burning, pain with urination Frequency: Ongoing Pertinent Negatives: Patient denies blood in urine, pain in lower back, discolored discharge, fever Disposition: [] ED /[] Urgent Care (no appt availability in office) / [x] Appointment(In office/virtual)/ []  [facility]/ [] Home Care/ [] Refused Recommended Disposition /[] [facility]/ []  Follow-up with PCP Additional Notes: Pt states she thought she had a UTI at first but it has gotten much worse. Pt states this has been going on for a month. Pt has not been seen by a doctor for this issue. Pt states she urinates every 5 minutes and it is a cup full. Pt states it burns when she pees. Pt states when she bends over and she feels a pain in her vagina. Pt scheduled for an appt with her PCP tomorrow morning. This RN educated pt on home care, new-worsening symptoms, when to call back/seek emergent care. Pt verbalized understanding and agrees to plan.    Copied from CRM [id number]. Topic: Clinical - Red Word Triage >> Sep 02, 2023  2:36 PM Jenny [name] wrote: Red Word that prompted transfer to Nurse Triage: UTI or bladder fail?? : very painful to urinate , can barely make it to bathroom. Reason for Disposition  Urinating more frequently than usual (i.e., frequency)  Answer Assessment - Initial Assessment Questions 1. SYMPTOM: "What's the main symptom you're concerned about?" (e.g., frequency, incontinence)     Frequency 2. ONSET: "When did the  pain  start?"     1 month 3. PAIN: "Is there any pain?" If Yes, ask: "How bad is it?" (Scale: 1-10; mild, moderate, severe)     8/10 4. CAUSE: "What do you think is causing the symptoms?"     Bladder dropped or UTI 5. OTHER SYMPTOMS: "Do you have any other symptoms?" (e.g., blood in urine, fever, flank pain, pain with urination)     Pain with urination  Protocols used: Urinary Symptoms-A-AH

## 2023-09-03 ENCOUNTER — Ambulatory Visit (INDEPENDENT_AMBULATORY_CARE_PROVIDER_SITE_OTHER): Payer: Medicare Other | Admitting: Family Medicine

## 2023-09-03 ENCOUNTER — Encounter: Payer: Self-pay | Admitting: Family Medicine

## 2023-09-03 VITALS — BP 136/85 | HR 76 | Temp 97.7°F | Ht 66.0 in | Wt 140.0 lb

## 2023-09-03 DIAGNOSIS — R350 Frequency of micturition: Secondary | ICD-10-CM

## 2023-09-03 DIAGNOSIS — M79606 Pain in leg, unspecified: Secondary | ICD-10-CM

## 2023-09-03 DIAGNOSIS — N814 Uterovaginal prolapse, unspecified: Secondary | ICD-10-CM | POA: Diagnosis not present

## 2023-09-03 LAB — POCT URINALYSIS DIPSTICK
Bilirubin, UA: NEGATIVE
Blood, UA: NEGATIVE
Glucose, UA: NEGATIVE
Ketones, UA: NEGATIVE
Leukocytes, UA: NEGATIVE
Nitrite, UA: NEGATIVE
Protein, UA: NEGATIVE
Spec Grav, UA: 1.015 (ref 1.010–1.025)
Urobilinogen, UA: 0.2 U/dL
pH, UA: 6 (ref 5.0–8.0)

## 2023-09-03 MED ORDER — OXYBUTYNIN CHLORIDE ER 10 MG PO TB24: 10.0000 mg | ORAL_TABLET | Freq: Every day | ORAL | 2 refills | Status: DC

## 2023-09-03 NOTE — Telephone Encounter (Signed)
Pt seen in office on 09/03/2023

## 2023-09-03 NOTE — Progress Notes (Signed)
Subjective   CC:  Chief Complaint  Patient presents with   Urinary Frequency    Pt stated that she has been having freq urination for the past month. Shot got up to use bathroom about 5x last night    HPI: Jenny [name] is a 73 y.o. female who presents to the office today to address the problems listed above in the chief complaint. Patient reports dysuria and urinary frequency.  She has sensation of increased urinary pressure.  She denies fevers flank pain nausea vomiting or gross hematuria.  Symptoms have been present for most days and nights over the last 4 weeks. Last night she had polyuria w/ pain radiating into pelvic area; feels like something is "hanging" down near vagina. No bleeding.  She denies history of interstitial cystitis.  She denies vaginal symptoms including vaginal discharge. She is postmenopausal.  Leg and back pain with h/o minimally elevated alk phosp and ca: see note from November with labs. Fortunately, her pain sxs have completely resolved.   Assessment  1. Frequent urination   2. Cystocele with prolapse   3. Pain of lower extremity, unspecified laterality      Plan  Cystocele w/ sxs: urine dipstick is clear. Will order culture to ensure no infection. Given moderate prolapse with significant sxs, will refer to urology. Start ditropan xl 10mg  daily to see if can help manage urinary sxs for now. Education given.  Elevated alk phosp and calcium: both values just above normal. Pain has resolved. No need to recheck at this time.   Follow up: as needed  Orders Placed This Encounter  Procedures   Urine Culture   Ambulatory referral to Urology   POCT Urinalysis Dipstick   Meds ordered this encounter  Medications   DISCONTD: oxybutynin (DITROPAN-XL) 10 MG 24 hr tablet    Sig: Take 1 tablet (10 mg total) by mouth at bedtime.    Dispense:  30 tablet    Refill:  2   oxybutynin (DITROPAN-XL) 10 MG 24 hr tablet    Sig: Take 1 tablet (10 mg total) by  mouth at bedtime.    Dispense:  30 tablet    Refill:  2      I reviewed the patients updated PMH, FH, and SocHx.    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    Priority: Medium    Adjustment disorder with anxious mood 04/09/2021    Priority: Medium    Laryngopharyngeal reflux (LPR) 09/01/2017    Priority: Medium    Family history of Alzheimer's disease 10/30/2014    Priority: Medium    GERD (gastroesophageal reflux disease) 07/25/2013    Priority: Medium    Heart murmur 04/09/2021    Priority: Low   Essential tremor 09/21/2018    Priority: Low   Leg pain 06/04/2023   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    Review of Systems: Cardiovascular: negative for chest pain Respiratory: negative for SOB or persistent cough Gastrointestinal: negative for abdominal pain Constitutional: Negative for fever malaise or anorexia  Objective  Vitals: BP 136/85   Pulse 76  Temp 97.7 F (36.5 C)   Ht 5\' 6"  (1.676 m)   Wt 140 lb (63.5 kg)   SpO2 99%   BMI 22.60 kg/m  General: no acute distress  Psych:  Alert and oriented, normal mood and affect Cardiovascular:  RRR without murmur or gallop. no peripheral edema Respiratory:  Good breath sounds bilaterally, CTAB with normal respiratory effort Gastrointestinal: soft, flat abdomen, normal active bowel sounds, no palpable masses, no hepatosplenomegaly, no appreciated hernias, NO CVAT, mild suprapubic ttp w/o rebound or guarding Pelvic Exam: Normal external genitalia, no vulvar or vaginal lesions present. Moderate cystocele present, thin vaginal mucosa Clear cervix w/o  CMT. Bimanual exam reveals a nontender fundus w/o masses, nl size. No adnexal masses present. No inguinal adenopathy.  Neurologic:   Mental status is normal. normal gait Office Visit on 09/03/2023  Component Date Value Ref Range Status   Color, UA 09/03/2023 straw   Final   Clarity, UA 09/03/2023 yellow   Final   Glucose, UA 09/03/2023 Negative  Negative Final   Bilirubin, UA 09/03/2023 negative   Final   Ketones, UA 09/03/2023 negative   Final   Spec Grav, UA 09/03/2023 1.015  1.010 - 1.025 Final   Blood, UA 09/03/2023 negative   Final   pH, UA 09/03/2023 6.0  5.0 - 8.0 Final   Protein, UA 09/03/2023 Negative  Negative Final   Urobilinogen, UA 09/03/2023 0.2  0.2 or 1.0 E.U./dL Final   Nitrite, UA 16/04/9603 negative   Final   Leukocytes, UA 09/03/2023 Negative  Negative Final   Commons side effects, risks, benefits, and alternatives for medications and treatment plan prescribed today were discussed, and the patient expressed understanding of the given instructions. Patient is instructed to call or message via MyChart if he/she has any questions or concerns regarding our treatment plan. No barriers to understanding were identified. We discussed Red Flag symptoms and signs in detail. Patient expressed understanding regarding what to do in case of urgent or emergency type symptoms.  Medication list was reconciled, printed and provided to the patient in AVS. Patient instructions and summary information was reviewed with the patient as documented in the AVS. This note was prepared with assistance of Dragon voice recognition software. Occasional wrong-word or sound-a-like substitutions may have occurred due to the inherent limitations of voice recognition software

## 2023-09-03 NOTE — Patient Instructions (Signed)
Please follow up if symptoms do not improve or as needed.    We will call you with information regarding your referral appointment. Urology for further evaluation.  If you do not hear from [place] within the next 2 weeks, please let me know. It can take 1-2 weeks to get appointments set up with the specialists.    About Cystocele  Overview  The pelvic organs, including the bladder, are normally supported by pelvic floor muscles and ligaments.  When these muscles and ligaments are stretched, weakened or torn, the wall between the bladder and the vagina sags or herniates causing a prolapse, sometimes called a cystocele.  This condition may cause discomfort and problems with emptying the bladder.  It can be present in various stages.  Some people are not aware of the changes.  Others may notice changes at the vaginal opening or a feeling of the bladder dropping outside the body.  Causes of a Cystocele  A cystocele is usually caused by muscle straining or stretching during childbirth.  In addition, cystocele is more common after menopause, because the hormone estrogen helps keep the elastic tissues around the pelvic organs strong.  A cystocele is more likely to occur when levels of estrogen decrease.  Other causes include: heavy lifting, chronic coughing, previous pelvic surgery and obesity.  Symptoms  A bladder that has dropped from its normal position may cause: unwanted urine leakage (stress incontinence), frequent urination or urge to urinate, incomplete emptying of the bladder (not feeling bladder relief after emptying), pain or discomfort in the vagina, pelvis, groin, lower back or lower abdomen and frequent urinary tract infections.  Mild cases may not cause any symptoms.  Treatment Options Pelvic floor (Kegel) exercises:  Strength training the muscles in your genital area Behavioral changes: Treating and preventing constipation, taking time to empty your bladder properly, learning to lift  properly and/or avoid heavy lifting when possible, stopping smoking, avoiding weight gain and treating a chronic cough or bronchitis. A pessary: A vaginal support device is sometimes used to help pelvic support caused by muscle and ligament changes. Surgery: Surgical repair may be necessary if symptoms cannot be managed with exercise, behavioral changes and a pessary.  Surgery is usually considered for severe cases.   2007, [employer]

## 2023-09-04 LAB — URINE CULTURE
MICRO NUMBER:: 16108697
SPECIMEN QUALITY:: ADEQUATE

## 2023-09-06 ENCOUNTER — Encounter: Payer: Self-pay | Admitting: Family Medicine

## 2023-09-06 NOTE — Progress Notes (Signed)
 See mychart note Dear Ms. Stennis, Your urine culture confirms that there is NO infection at this time.  Sincerely, Dr. Mardelle Matte

## 2023-09-30 ENCOUNTER — Other Ambulatory Visit: Payer: Self-pay | Admitting: Family Medicine

## 2023-09-30 DIAGNOSIS — Z1231 Encounter for screening mammogram for malignant neoplasm of breast: Secondary | ICD-10-CM

## 2023-10-12 DIAGNOSIS — N8111 Cystocele, midline: Secondary | ICD-10-CM | POA: Diagnosis not present

## 2023-10-12 DIAGNOSIS — R3914 Feeling of incomplete bladder emptying: Secondary | ICD-10-CM | POA: Diagnosis not present

## 2023-11-02 ENCOUNTER — Ambulatory Visit
Admission: RE | Admit: 2023-11-02 | Discharge: 2023-11-02 | Disposition: A | Discharge status: Home / Self Care | Source: Ambulatory Visit | Attending: Family Medicine | Admitting: Family Medicine

## 2023-11-02 DIAGNOSIS — Z1231 Encounter for screening mammogram for malignant neoplasm of breast: Secondary | ICD-10-CM

## 2023-11-04 ENCOUNTER — Ambulatory Visit

## 2024-04-25 DIAGNOSIS — L814 Other melanin hyperpigmentation: Secondary | ICD-10-CM | POA: Diagnosis not present

## 2024-04-25 DIAGNOSIS — L821 Other seborrheic keratosis: Secondary | ICD-10-CM | POA: Diagnosis not present

## 2024-04-25 DIAGNOSIS — L570 Actinic keratosis: Secondary | ICD-10-CM | POA: Diagnosis not present

## 2024-04-25 DIAGNOSIS — D225 Melanocytic nevi of trunk: Secondary | ICD-10-CM | POA: Diagnosis not present

## 2024-04-25 DIAGNOSIS — L565 Disseminated superficial actinic porokeratosis (DSAP): Secondary | ICD-10-CM | POA: Diagnosis not present

## 2024-05-24 ENCOUNTER — Ambulatory Visit (INDEPENDENT_AMBULATORY_CARE_PROVIDER_SITE_OTHER): Payer: Medicare Other | Admitting: Family Medicine

## 2024-05-24 ENCOUNTER — Encounter: Payer: Self-pay | Admitting: Family Medicine

## 2024-05-24 VITALS — BP 124/74 | HR 74 | Temp 97.7°F | Ht 66.0 in | Wt 143.8 lb

## 2024-05-24 DIAGNOSIS — M810 Age-related osteoporosis without current pathological fracture: Secondary | ICD-10-CM

## 2024-05-24 DIAGNOSIS — N8110 Cystocele, unspecified: Secondary | ICD-10-CM | POA: Insufficient documentation

## 2024-05-24 DIAGNOSIS — K219 Gastro-esophageal reflux disease without esophagitis: Secondary | ICD-10-CM

## 2024-05-24 DIAGNOSIS — Z0001 Encounter for general adult medical examination with abnormal findings: Secondary | ICD-10-CM | POA: Diagnosis not present

## 2024-05-24 DIAGNOSIS — E782 Mixed hyperlipidemia: Secondary | ICD-10-CM

## 2024-05-24 LAB — LIPID PANEL
Cholesterol: 196 mg/dL (ref 0–200)
HDL: 55 mg/dL (ref >39.00)
LDL Cholesterol: 120 mg/dL — ABNORMAL HIGH (ref 0–99)
NonHDL: 141.15
Total CHOL/HDL Ratio: 4
Triglycerides: 108 mg/dL (ref 0.0–149.0)
VLDL: 21.6 mg/dL (ref 0.0–40.0)

## 2024-05-24 LAB — COMPREHENSIVE METABOLIC PANEL WITH GFR
ALT: 12 U/L (ref 0–35)
AST: 16 U/L (ref 0–37)
Albumin: 4.5 g/dL (ref 3.5–5.2)
Alkaline Phosphatase: 118 U/L — ABNORMAL HIGH (ref 39–117)
BUN: 19 mg/dL (ref 6–23)
CO2: 29 meq/L (ref 19–32)
Calcium: 9.8 mg/dL (ref 8.4–10.5)
Chloride: 104 meq/L (ref 96–112)
Creatinine, Ser: 0.75 mg/dL (ref 0.40–1.20)
GFR: 78.26 mL/min (ref >60.00)
Glucose, Bld: 82 mg/dL (ref 70–99)
Potassium: 4.5 meq/L (ref 3.5–5.1)
Sodium: 140 meq/L (ref 135–145)
Total Bilirubin: 0.5 mg/dL (ref 0.2–1.2)
Total Protein: 6.8 g/dL (ref 6.0–8.3)

## 2024-05-24 LAB — CBC WITH DIFFERENTIAL/PLATELET
Basophils Absolute: 0 K/uL (ref 0.0–0.1)
Basophils Relative: 1 % (ref 0.0–3.0)
Eosinophils Absolute: 0.1 K/uL (ref 0.0–0.7)
Eosinophils Relative: 2.6 % (ref 0.0–5.0)
HCT: 38.9 % (ref 36.0–46.0)
Hemoglobin: 13.1 g/dL (ref 12.0–15.0)
Lymphocytes Relative: 32.4 % (ref 12.0–46.0)
Lymphs Abs: 1.4 K/uL (ref 0.7–4.0)
MCHC: 33.6 g/dL (ref 30.0–36.0)
MCV: 87.3 fl (ref 78.0–100.0)
Monocytes Absolute: 0.4 K/uL (ref 0.1–1.0)
Monocytes Relative: 10.6 % (ref 3.0–12.0)
Neutro Abs: 2.2 K/uL (ref 1.4–7.7)
Neutrophils Relative %: 53.4 % (ref 43.0–77.0)
Platelets: 205 K/uL (ref 150.0–400.0)
RBC: 4.46 Mil/uL (ref 3.87–5.11)
RDW: 13.2 % (ref 11.5–15.5)
WBC: 4.2 K/uL (ref 4.0–10.5)

## 2024-05-24 LAB — VITAMIN D 25 HYDROXY (VIT D DEFICIENCY, FRACTURES): VITD: 50.57 ng/mL (ref 30.00–100.00)

## 2024-05-24 LAB — VITAMIN B12: Vitamin B-12: 834 pg/mL (ref 211–911)

## 2024-05-24 LAB — TSH: TSH: 4.89 u[IU]/mL (ref 0.35–5.50)

## 2024-05-24 NOTE — Progress Notes (Signed)
 Subjective  Chief Complaint  Patient presents with   Annual Exam    Pt here for Annual exam and is currently fasting     HPI: Jenny [name] is a 73 y.o. female who presents to [facility] [place] today for a Female Wellness Visit. She also has the concerns and/or needs as listed above in the chief complaint. These will be addressed in addition to the Health Maintenance Visit.   Wellness Visit: annual visit with health maintenance review and exam   HM: Screenings are all current.  Healthy lifestyle.  Doing well heading to the beach this afternoon.  Fortunately husband is in remission from bladder cancer.  Defers flu shot today.  Body mass index is 23.21 kg/m. Wt Readings from Last 3 Encounters:  05/24/24 143 lb 12.8 oz (65.2 kg)  09/03/23 140 lb (63.5 kg)  07/28/23 140 lb (63.5 kg)   Chronic disease management visit and/or acute problem visit: Discussed the use of AI scribe software for clinical note transcription with the patient, who gave verbal consent to proceed.  History of Present Illness Jenny [name] is a 73 year old female who presents for follow-up and management of a prolapsed bladder.  Pelvic organ prolapse symptoms and frequent urination - Sensation of 'insides falling out' by the end of the day, attributed to gravity - Discomfort increases throughout the day, requiring her to get off her feet in the evening - Feels 'pretty good' in the morning - No use of vaginal hormones or estrogen for tissue support - Previously tried medication for prolapsed bladder, discontinued due to severe constipation, failed Ditropan  - Currently not taking any medication for prolapse -To start physical therapy for pelvic floor dysfunction.Lower urinary tract symptoms - Urinary urgency and frequency - Requires prompt bladder emptying  Cholesterol management and cardiovascular risk, had been on Lipitor for elevated ASCVD risk.  Tolerated well but has some  skepticism about statins.  Stopped about 2 months ago.  Has been eating healthy. - Managing cholesterol through dietary modifications, including chia seeds - Discontinued statin medication a couple of months ago - Concerned about cholesterol levels and considering further cardiovascular risk assessment - No history of diabetes, hypertension, or known heart disease - Aware of age-related cardiovascular risk factors  LPR is controlled on Pepcid .  Osteoporosis: Recommend Prolia .  However patient declines further treatment at this time.  Understands risk versus benefits.  Sleep and general well-being - Sleeping well, sometimes until 9 AM - No issues with sleep quality - No worsening of sciatic leg pain - Taking vitamin D  and calcium  supplements    Assessment  1. Encounter for well adult exam with abnormal findings   2. Mixed hyperlipidemia   3. Age-related osteoporosis without current pathological fracture   4. Laryngopharyngeal reflux (LPR)   5. Hypercalcemia   6. Bladder prolapse, female, acquired      Plan  Female Wellness Visit: Age appropriate Health Maintenance and Prevention measures were discussed with patient. Included topics are cancer screening recommendations, ways to keep healthy (see AVS) including dietary and exercise recommendations, regular eye and dental care, use of seat belts, and avoidance of moderate alcohol use and tobacco use.  BMI: discussed patient's BMI and encouraged positive lifestyle modifications to help get to or maintain a target BMI. HM needs and immunizations were addressed and ordered. See below for orders. See HM and immunization section for updates. Routine labs and screening tests ordered including cmp, cbc and lipids where appropriate.  Discussed recommendations regarding Vit D and calcium  supplementation (see AVS)  Chronic disease f/u and/or acute problem visit: (deemed necessary to be done in addition to the wellness visit): Assessment and  Plan Assessment & Plan Adult Wellness Visit Routine wellness visit with no acute concerns. Discussed general health maintenance, including flu vaccination and cholesterol management. - Ordered lab work for cholesterol panel - Discussed flu vaccination for the upcoming season  Pelvic organ prolapse (bladder) Bladder prolapse with symptoms of heaviness and discomfort, especially by the end of the day. Surgery not recommended due to age. Physical therapy with a urogynecologist scheduled.  Ditropan  for overactive bladder therpy caused constipation. Discussed potential benefits of vaginal estrogen for tissue support, though it will not address mechanical prolapse. - Proceed with physical therapy on November 24th - Will consider alternative vaginal estrogen therapy if symptoms persist, can consider other overactive bladder medications as well to see if that will help.  Mixed hyperlipidemia with elevated ASCVD score. Borderline cholesterol levels. Previously on statin therapy but discontinued due to concerns about side effects and family history of dementia. Discussed cardiovascular risk factors and potential benefits of statins. Consideration of coronary calcium  score for further risk assessment. Discussed alternative cholesterol panel to assess particle size and risk. - Ordered cholesterol panel with NMR lipoprotein analysis for further restratification.  Discussed benefits of statin use. - Discussed coronary calcium  score test if cholesterol panel results are inconclusive  Age-related osteoporosis without current pathological fracture and mild hypercalcemia Previous calcium  levels were just above normal.  Need to recheck.  She does take calcium  and vitamin D .  She is very active and exercises. - Declines Prolia  or other medications at this time to treat her osteoporosis.  Understands risk versus benefit. - Recheck calcium  level and PTH today. - Continue to monitor vitamin D   GERD and LPR  well-controlled on Pepcid      Follow up: 12 months for complete physical Commons side effects, risks, benefits, and alternatives for medications and treatment plan prescribed today were discussed, and the patient expressed understanding of the given instructions. Patient is instructed to call or message via MyChart if he/she has any questions or concerns regarding our treatment plan. No barriers to understanding were identified. We discussed Red Flag symptoms and signs in detail. Patient expressed understanding regarding what to do in case of urgent or emergency type symptoms.  Medication list was reconciled, printed and provided to the patient in AVS. Patient instructions and summary information was reviewed with the patient as documented in the AVS. This note was prepared with assistance of Dragon voice recognition software. Occasional wrong-word or sound-a-like substitutions may have occurred due to the inherent limitations of voice recognition software  Orders Placed This Encounter  Procedures   TSH   VITAMIN D  25 Hydroxy (Vit-D Deficiency, Fractures)   CBC with Differential/Platelet   Comprehensive metabolic panel with GFR   Lipid panel   Vitamin B12   PTH, intact (no Ca)   Lipoprotein Analysis by NMR   No orders of the defined types were placed in this encounter.    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   Rotator cuff syndrome of right shoulder 11/17/2018   Health  Maintenance  Topic Date Due   COVID-19 Vaccine (3 - 2025-26 season) 06/09/2024 (Originally 03/14/2024)   Influenza Vaccine  10/11/2024 (Originally 02/12/2024)   Medicare Annual Wellness (AWV)  07/27/2024   DEXA SCAN  10/29/2024   Mammogram  11/01/2024   Colonoscopy  07/15/2027   DTaP/Tdap/Td (3 - Td or Tdap) 09/19/2027   Pneumococcal Vaccine: 50+ Years  Completed   Hepatitis C Screening  Completed   Zoster Vaccines- Shingrix   Completed   Meningococcal B Vaccine  Aged Out   Immunization History  Administered Date(s) Administered   Fluad Quad(high Dose 65+) 03/20/2020, 04/09/2021, 05/07/2022   INFLUENZA, HIGH DOSE SEASONAL PF 04/13/2017, 03/28/2018, 03/28/2019   Influenza Split 04/22/2012, 04/24/2013   Influenza Whole 04/13/2009   Influenza-Unspecified 05/03/2014, 04/17/2016, 07/17/2023   PFIZER(Purple Top)SARS-COV-2 Vaccination 08/27/2019, 09/21/2019   Pneumococcal Conjugate-13 10/30/2014   Pneumococcal Polysaccharide-23 09/01/2017   Td 07/14/2006   Tdap 09/18/2017   Zoster Recombinant(Shingrix ) 01/27/2018, 05/12/2018   Zoster, Live 08/13/2012   We updated and reviewed the patient's past history in detail and it is documented below. Allergies: Patient is allergic to codeine, hydrocodeine [dihydrocodeine], and fosamax [alendronate sodium]. Past Medical History  has a past medical history of Essential tremor (09/21/2018), GERD (gastroesophageal reflux disease), Laryngopharyngeal reflux (LPR) (09/01/2017), Mixed hyperlipidemia (04/11/2021), and Osteoporosis. Past Surgical History Patient  has a past surgical history that includes Tonsillectomy and Colonoscopy. Social History Patient  reports that she has never smoked. She has never used smokeless tobacco. She reports that she does not drink alcohol and does not use drugs. Family History family history includes Alzheimer's disease in her maternal grandmother and mother; Arthritis in her brother and brother; Cancer in her father, maternal  grandmother, and maternal uncle; Colon cancer in her maternal grandfather and maternal uncle; Healthy in her daughter and son; Heart disease in her mother; Hyperlipidemia in her mother; Hypertension in her mother; Lung cancer in her maternal aunt; Pulmonary embolism in her brother and mother. Review of Systems: Constitutional: negative for fever or malaise Ophthalmic: negative for photophobia, double vision or loss of vision Cardiovascular: negative for chest pain, dyspnea on exertion, or new LE swelling Respiratory: negative for SOB or persistent cough Gastrointestinal: negative for abdominal pain, change in bowel habits or melena Genitourinary: negative for dysuria or gross hematuria Musculoskeletal: negative for new gait disturbance or muscular weakness Integumentary: negative for new or persistent rashes Neurological: negative for TIA or stroke symptoms Psychiatric: negative for SI or delusions Allergic/Immunologic: negative for hives  Patient Care Team    Relationship Specialty Notifications Start End  Jodie Lavern CROME, MD PCP - General Family Medicine  09/01/17   Aneita Gwendlyn DASEN, MD (Inactive)  Gastroenterology  06/16/11    Comment: ? vs debrah Darlean Ozell KATHEE, MD Consulting Physician Pulmonary Disease  09/01/17   Joshua Alm Hamilton, MD Consulting Physician Neurosurgery  09/03/23   Mat Browning, MD Consulting Physician Obstetrics and Gynecology  05/24/24    Objective  Vitals: BP 124/74   Pulse 74   Temp 97.7 F (36.5 C)   Ht 5' 6 (1.676 m)   Wt 143 lb 12.8 oz (65.2 kg)   SpO2 95%   BMI 23.21 kg/m  General:  Well developed, well nourished, no acute distress, appears younger than stated age Psych:  Alert and orientedx3,normal mood and affect HEENT:  Normocephalic, atraumatic, non-icteric sclera,  oropharynx is clear without mass or exudate, supple neck without adenopathy, or thyromegaly Cardiovascular:  Normal S1, S2, RRR without gallop, rub or murmur,  Respiratory:  Good  breath sounds bilaterally, CTAB with normal respiratory effort Gastrointestinal: normal bowel sounds, soft, non-tender, no noted masses. No HSM MSK: Joints are without erythema or swelling.  Skin:  Warm, no rashes Neurologic:    Mental status is normal.  Gross motor and sensory exams are normal. Stable gait.

## 2024-05-24 NOTE — Patient Instructions (Signed)
 Please return in 12 months for your annual complete physical; please come fasting.   I will release your lab results to you on your MyChart account with further instructions. You may see the results before I do, but when I review them I will send you a message with my report or have my assistant call you if things need to be discussed. Please reply to my message with any questions. Thank you!   If you have any questions or concerns, please don't hesitate to send me a message via MyChart or call the office at [phone]. Thank you for visiting with us  today! It's our pleasure caring for you.    VISIT SUMMARY: Today, you had a follow-up appointment to manage your prolapsed bladder and discuss your overall health. We reviewed your symptoms, including the heaviness and discomfort you feel by the end of the day, and your urinary urgency and frequency. We also talked about your cholesterol management and cardiovascular risk, as well as your sleep and general well-being.  YOUR PLAN: -PELVIC ORGAN PROLAPSE (BLADDER): Pelvic organ prolapse occurs when the bladder drops from its normal position and pushes against the walls of the vagina, causing discomfort and a sensation of heaviness. Surgery is not recommended due to your age, but you are scheduled for physical therapy with a urogynecologist on November 24th. We also discussed the potential benefits of vaginal estrogen for tissue support, though it will not fix the prolapse itself. We will consider alternative vaginal estrogen therapy if your symptoms persist.  -MIXED HYPERLIPIDEMIA: Mixed hyperlipidemia means you have borderline high cholesterol levels. You previously stopped taking statins due to side effects and family history of dementia. We discussed your cardiovascular risk factors and the potential benefits of statins. We ordered a cholesterol panel and may consider a coronary calcium  score test if the cholesterol panel results are  inconclusive.  -AGE-RELATED OSTEOPOROSIS WITHOUT CURRENT PATHOLOGICAL FRACTURE: Age-related osteoporosis is a condition where bones become weak and brittle with age. Your previous calcium  levels were near normal, and we will continue to monitor them.  I recommend reconsidering medical therapy to treat this condition to prevent worsening.  Will recheck your bone density next year regardless.  -HYPERCALCEMIA: Hypercalcemia is a condition where there is too much calcium  in your blood. Your previous calcium  levels were near normal, and we will continue to monitor them.  INSTRUCTIONS: Please proceed with your physical therapy appointment on November 24th. We have ordered lab work for a cholesterol panel, and we will discuss the results once they are available. If the cholesterol panel results are inconclusive, we may consider a coronary calcium  score test. Continue taking your vitamin D  and calcium  supplements, and monitor your calcium  levels as discussed. Consider getting a flu vaccination for the upcoming season.                      Contains text generated by [employer]bridge.                                 Contains text generated by [employer]bridge.

## 2024-05-25 LAB — LIPOPROTEIN ANALYSIS BY NMR
HDL Particle Number: 38.3 umol/L (ref >=30.5)
LDL Particle Number: 1372 nmol/L — ABNORMAL HIGH (ref <1000)
LDL Size: 21.3 nm (ref >20.5)
LP-IR Score: <25 (ref <=45)
Small LDL Particle Number: 520 nmol/L (ref <=527)

## 2024-05-25 LAB — PARATHYROID HORMONE, INTACT (NO CA): PTH: 19 pg/mL (ref 16–77)

## 2024-06-01 ENCOUNTER — Ambulatory Visit: Payer: Self-pay | Admitting: Family Medicine

## 2024-06-01 NOTE — Progress Notes (Signed)
 Labs reviewed.  The 10-year ASCVD risk score (Arnett DK, et al., 2019) is: 13.6%   Values used to calculate the score:     Age: 73 years     Clincally relevant sex: Female     Is Non-Hispanic African American: No     Diabetic: No     Tobacco smoker: No     Systolic Blood Pressure: 124 mmHg     Is BP treated: No     HDL Cholesterol: 55 mg/dL     Total Cholesterol: 196 mg/dL

## 2024-06-05 NOTE — Therapy (Unsigned)
 OUTPATIENT PHYSICAL THERAPY FEMALE PELVIC EVALUATION   Patient Name: Jenny [name] MRN: [id number] DOB:04/12/1951, 73 y.o., female Today's Date: 06/06/2024  END OF SESSION:  PT End of Session - 06/06/24 1357     Visit Number 1    Date for Recertification  09/06/24    Authorization Type waiting for auth    Authorization Time Period [facility] Medicare    PT Start Time 1235    PT Stop Time 1315    PT Time Calculation (min) 40 min    Activity Tolerance Patient tolerated treatment well;No increased pain    Behavior During Therapy [facility] for tasks assessed/performed          Past Medical History:  Diagnosis Date   Essential tremor 09/21/2018   GERD (gastroesophageal reflux disease)    Laryngopharyngeal reflux (LPR) 09/01/2017   Had eval by GI and pulm   Mixed hyperlipidemia 04/11/2021   Offered statin 2022   Osteoporosis    by dexa 2009 had se of fosamax   Past Surgical History:  Procedure Laterality Date   COLONOSCOPY     TONSILLECTOMY     age 74   Patient Active Problem List   Diagnosis Date Noted   Bladder prolapse, female, acquired 05/24/2024   Leg pain 06/04/2023   Osteoarthritis of spine with radiculopathy, cervical region 05/12/2022   Mixed hyperlipidemia 04/11/2021   Heart murmur 04/09/2021   Adjustment disorder with anxious mood 04/09/2021   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   Rotator cuff syndrome of right shoulder 11/17/2018   Essential tremor 09/21/2018   Laryngopharyngeal reflux (LPR) 09/01/2017   Family history of Alzheimer's disease 10/30/2014   GERD (gastroesophageal reflux disease) 07/25/2013   Osteoporosis 06/17/2011    PCP: Jodie Lavern CROME, MD  REFERRING PROVIDER: Mat Browning, MD  REFERRING DIAG: N81.89 (ICD-10-CM) - Other female genital prolapse   THERAPY DIAG:  Muscle weakness (generalized)  Other lack of coordination  Rationale for Evaluation and Treatment: Rehabilitation  ONSET DATE: 2024  SUBJECTIVE:                                                                                                                                                                                            SUBJECTIVE STATEMENT: Patient reports that she has prolapsed bladder, she has to go to the bathroom a lot. Can be painful. Mornings is pretty good, stays active, evening can be worse.  Has to go to the bathroom a lot.  Has some left shoulder pain, she sleeps on it and has to move it.  Prolapse has been bothersome Does not want to have a surgery Left handed, has had  left shoulder pain  Fluid intake: to be asked  FUNCTIONAL LIMITATIONS: pain and pressure in vaginal canal  PERTINENT HISTORY:  Medications for current condition: no Surgeries: no Other: no Sexual abuse: No  PAIN:  Are you having pain? Yes NPRS scale: 0-5/10 Pain location: Internal and Vaginal anterior  Pain type: aching Pain description: intermittent   Aggravating factors: being on her feet Relieving factors: getting of her feet  PRECAUTIONS: None  RED FLAGS: None   WEIGHT BEARING RESTRICTIONS: No  FALLS:  Has patient fallen in last 6 months? No  OCCUPATION: retired  ACTIVITY LEVEL : active, has a treadmill, walks  PLOF: Independent  PATIENT GOALS: to have less pressure, wants prolapse to go away   BOWEL MOVEMENT: no issues  URINATION: Pain with urination: Yes some pain- burning Fully empty bladder: No                                         Post-void dribble: Yes  Stream: Strong and Weak Urgency: Yes  Frequency:during the day yes                                                        Nocturia: Yes: 3at times with urgency and pain   Leakage: none Pads/briefs: No  INTERCOURSE:   Ability to have vaginal penetration Yes  Pain with intercourse: none Dryness: No Climax: yes Marinoff Scale: 1/3 Lubricant:  PREGNANCY: Vaginal deliveries 2 Tearing Yes:   Episiotomy yes, forceps, big baby C-section deliveries no Currently pregnant  No  PROLAPSE: Pressure and Bulge   OBJECTIVE:  Note: Objective measures were completed at Evaluation unless otherwise noted.  DIAGNOSTIC FINDINGS:  Post-void residual: Voiding Cystourethrogram (VCUG):  Ultrasound:   PATIENT SURVEYS:    PFIQ-7: 144 UIQ-7 56 CRAIG -7 47 POPIQ-7 44 Female Sexual Function Index (FSFI) Questionnaire   COGNITION: Overall cognitive status: Within functional limits for tasks assessed     SENSATION: Light touch: Appears intact  LUMBAR SPECIAL TESTS:  Straight leg raise test: Negative  FUNCTIONAL TESTS:   Single leg stance: mild pelvic sway   Sit-up test:1/3 Squat:  Bed mobility:within functional limitations   GAIT: Assistive device utilized: None Comments: mild IR bilateral hips  POSTURE: No Significant postural limitations   LUMBARAROM/PROM:full   LOWER EXTREMITY ROM: mild restrictions throughout   LOWER EXTREMITY MMT: bilateral hips 4/5  PALPATION:  General:   Pelvic Alignment: even  Abdominal: upper abdominal gripping  Diastasis: No Distortion: No  Breathing: able to do diaphragmatic breathing  Scar tissue: No Active Straight Leg Raise: neg                External Perineal Exam: labia minora visible, tissues not dry                             Internal Pelvic Floor: pelvic floor weakness present, improved lift with horizontal abduction with thera band and exhale  Patient confirms identification and approves PT to assess internal pelvic floor and treatment No All internal or external pelvic floor assessments and/or treatments are completed with proper hand hygiene and gloves hands. If needed gloves are changed with hand hygiene during patient care time.  PELVIC MMT:   MMT eval  Vaginal 2/5  Internal Anal Sphincter   External Anal Sphincter   Puborectalis   (Blank rows = not tested)        TONE: low  PROLAPSE: Mild anterior and posterior vaginal laxity in hook lying at 1 pm  TODAY'S TREATMENT:                                                                                                                               DATE: 06/06/2024  EVAL  Examination completed, findings reviewed, pt educated on POC, HEP, and female pelvic floor anatomy, reasoning with pelvic floor assessment internally with pt consent. Pt motivated to participate in PT and agreeable to attempt recommendations.     PATIENT EDUCATION:  Education details: Pt was educated on relevant anatomy, exam findings, home exercise program, plan of care, expectations of PT and pressure management exercises and how to exhale with lifting wood and moving furniture  Person educated: Patient Education method: Explanation, Demonstration, Tactile cues, Verbal cues, and Handouts Education comprehension: verbalized understanding, returned demonstration, verbal cues required, tactile cues required, and needs further education  HOME EXERCISE PROGRAM: Access Code: [email or web address] URL: [email or web address] Date: 06/06/2024 Prepared by: Cori Tenita Cue  Exercises - Box Lift from [place] with Pelvic Floor Contraction  - 1 x daily - 7 x weekly - 2 sets - 10 reps - Seated Isometric Hip Adduction with Pelvic Floor Contraction  - 1 x daily - 7 x weekly - 2 sets - 10 reps - Seated Abdominal Press into [employer]  - 1 x daily - 7 x weekly - 2 sets - 10 reps - Horizontal abd with TB with TRA breath  - 1 x daily - 7 x weekly - 2 sets - 10 reps  ASSESSMENT:  CLINICAL IMPRESSION: Patient is a 73 y.o. F who was seen today for physical therapy evaluation and treatment for bladder prolapse. Patient reports that symptoms are pressure and urinary urgency and frequency. Exam findings are notable for upper chest  breathing strategies, abdominal gripping, pelvic floor muscle weakness, low tone in pelvic floor. External soft tissues of pelvic floor appear fine, not dry. Patient demonstrates reduced trunk mobility, bilateral hip weakness, reduced range or motion  in pelvic floor and pain in left shoulder that has been limiting her lifting. It is difficult for patient to sleep due to urinary frequency and left shoulder pain. Discussed findings with patient, educated patient on relevant anatomy and expectations of PT and HEP was initiated. Patient's quality of life has been affected, patient is frustrated and will benefit from physical therapy to address deficits, reduce vaginal pressure and improve urinary frequency and urgency and quality of life.    OBJECTIVE IMPAIRMENTS: decreased activity tolerance, decreased coordination, decreased strength, and pain.   ACTIVITY LIMITATIONS: standing and toileting  PARTICIPATION LIMITATIONS: interpersonal relationship, community activity, and yard work  PERSONAL FACTORS: Time since onset of injury/illness/exacerbation are also affecting patient's functional  outcome.   REHAB POTENTIAL: Good  CLINICAL DECISION MAKING: Evolving/moderate complexity  EVALUATION COMPLEXITY: Moderate   GOALS: Goals reviewed with patient? Yes  SHORT TERM GOALS: Target date: 07/04/2024    Pt will be independent with HEP.   Baseline: Goal status: INITIAL  2.  Patient will demonstrate no breath holding with her exercises for at least 40 mins to reduce pelvic pressure Baseline:  Goal status: INITIAL  3.  Patient will report no pain with intercourse Baseline:  Goal status: INITIAL  4.  Patient will report more complete bladder emptying in order to be comfortable with community activities  Baseline:  Goal status: INITIAL    LONG TERM GOALS: Target date: 08/29/2024     Pt will be I and consistent with her HEP and demonstrate all exercises correctly  Baseline:  Goal status: INITIAL  2.  Pt will demonstrate 20 point improvement in PFIQ-7 score in order to show functional improvement in urinary incontinence.   Baseline:  Goal status: INITIAL  3.  Patient able to engage her lower abdominal equally with the upper to reduce  pressure on her pelvic floor.   Baseline:  Goal status: INITIAL  4.  Pt will be educated on vaginal moisturizers to improve vaginal tissue health and improve lengthening of the tissue  Baseline:  Goal status: INITIAL  5.  Patient will report no vaginal pain, pressure or heaviness Baseline: 10/10 Goal status: INITIAL  6.  Patient will get up max 1 time/ night in order to get a better rest Baseline:  Goal status: INITIAL  PLAN:  PT FREQUENCY: 1-2x/week  PT DURATION: 12 weeks  PLANNED INTERVENTIONS: 97110-Therapeutic exercises, 97530- Therapeutic activity, 97112- Neuromuscular re-education, 97535- Self Care, [id number]- Manual therapy, [phone]- Electrical stimulation (manual), [phone] (1-2 muscles), [phone] (3+ muscles)- Dry Needling, Patient/Family education, Taping, Joint mobilization, Joint manipulation, Spinal manipulation, Spinal mobilization, Scar mobilization, Cryotherapy, Moist heat, and Biofeedback  PLAN FOR NEXT SESSION: pressure management exercises with exhale, strengthening lower abdomen   Pammie Chirino, PT 06/06/2024, 1:58 PM

## 2024-06-06 ENCOUNTER — Encounter: Payer: Self-pay | Admitting: Physical Therapy

## 2024-06-06 ENCOUNTER — Other Ambulatory Visit: Payer: Self-pay

## 2024-06-06 ENCOUNTER — Ambulatory Visit: Attending: Obstetrics and Gynecology | Admitting: Physical Therapy

## 2024-06-06 DIAGNOSIS — R278 Other lack of coordination: Secondary | ICD-10-CM | POA: Diagnosis present

## 2024-06-06 DIAGNOSIS — M6281 Muscle weakness (generalized): Secondary | ICD-10-CM | POA: Insufficient documentation

## 2024-06-06 NOTE — Addendum Note (Signed)
 Addended by: Jahni Paul on: 06/06/2024 04:03 PM   Modules accepted: Orders

## 2024-06-13 ENCOUNTER — Telehealth: Payer: Self-pay | Admitting: Family Medicine

## 2024-06-13 NOTE — Telephone Encounter (Signed)
 Pt is requesting to have the most recent lab results mailed to her. She does not use MyChart.

## 2024-06-13 NOTE — Telephone Encounter (Signed)
 Labs mailed

## 2024-08-01 ENCOUNTER — Ambulatory Visit: Payer: Medicare Other

## 2024-08-01 VITALS — Ht 64.5 in | Wt 140.0 lb

## 2024-08-01 DIAGNOSIS — Z0000 Encounter for general adult medical examination without abnormal findings: Secondary | ICD-10-CM

## 2024-08-01 NOTE — Patient Instructions (Signed)
 Jenny [name],  Thank you for taking the time for your Medicare Wellness Visit. I appreciate your continued commitment to your health goals. Please review the care plan we discussed, and feel free to reach out if I can assist you further.  Please note that Annual Wellness Visits do not include a physical exam. Some assessments may be limited, especially if the visit was conducted virtually. If needed, we may recommend an in-person follow-up with your provider.  Ongoing Care Seeing your primary care provider every 3 to 6 months helps us  monitor your health and provide consistent, personalized care.   Referrals If a referral was made during today's visit and you haven't received any updates within two weeks, please contact the referred provider directly to check on the status.  Recommended Screenings:  Health Maintenance  Topic Date Due   COVID-19 Vaccine (3 - 2025-26 season) 03/14/2024   Flu Shot  10/11/2024*   Osteoporosis screening with Bone Density Scan  10/29/2024   Breast Cancer Screening  11/01/2024   Medicare Annual Wellness Visit  08/01/2025   Colon Cancer Screening  07/15/2027   DTaP/Tdap/Td vaccine (3 - Td or Tdap) 09/19/2027   Pneumococcal Vaccine for age over 53  Completed   Hepatitis C Screening  Completed   Zoster (Shingles) Vaccine  Completed   Meningitis B Vaccine  Aged Out  *Topic was postponed. The date shown is not the original due date.       08/01/2024   10:03 AM  Advanced Directives  Does Patient Have a Medical Advance Directive? Yes  Type of Advance Directive Healthcare Power of Attorney  Copy of Healthcare Power of Attorney in Chart? No - copy requested    Vision: Annual vision screenings are recommended for early detection of glaucoma, cataracts, and diabetic retinopathy. These exams can also reveal signs of chronic conditions such as diabetes and high blood pressure.  Dental: Annual dental screenings help detect early signs of oral cancer, gum disease,  and other conditions linked to overall health, including heart disease and diabetes.  Please see the attached documents for additional preventive care recommendations.

## 2024-08-01 NOTE — Progress Notes (Signed)
 "  Chief Complaint  Patient presents with   Medicare Wellness     Subjective:   Jenny [name] is a 74 y.o. female who presents for a Medicare Annual Wellness Visit.  Visit info / Clinical Intake: Medicare Wellness Visit Type:: Subsequent Annual Wellness Visit Persons participating in visit and providing information:: patient Medicare Wellness Visit Mode:: Telephone If telephone:: video declined Since this visit was completed virtually, some vitals may be partially provided or unavailable. Missing vitals are due to the limitations of the virtual format.: Unable to obtain vitals - no equipment If Telephone or Video please confirm:: I connected with patient using audio/video enable telemedicine. I verified patient identity with two identifiers, discussed telehealth limitations, and patient agreed to proceed. Patient Location:: home Provider Location:: office Interpreter Needed?: No Pre-visit prep was completed: yes AWV questionnaire completed by patient prior to visit?: no Living arrangements:: lives with spouse/significant other Patient's Overall Health Status Rating: excellent Typical amount of pain: none Does pain affect daily life?: no Are you currently prescribed opioids?: no  Dietary Habits and Nutritional Risks How many meals a day?: 3 Eats fruit and vegetables daily?: yes Most meals are obtained by: preparing own meals; eating out In the last 2 weeks, have you had any of the following?: none Diabetic:: no  Functional Status Activities of Daily Living (to include ambulation/medication): Independent Ambulation: Independent with device- listed below Home Assistive Devices/Equipment: Other (Comment) (right eye lens) Medication Administration: Independent Home Management (perform basic housework or laundry): Independent Manage your own finances?: yes Primary transportation is: driving Concerns about vision?: no *vision screening is required for WTM* Concerns about  hearing?: no  Fall Screening Falls in the past year?: 0 Number of falls in past year: 0 Was there an injury with Fall?: 0 Fall Risk Category Calculator: 0 Patient Fall Risk Level: Low Fall Risk  Fall Risk Patient at Risk for Falls Due to: No Fall Risks Fall risk Follow up: Falls evaluation completed  Home and Transportation Safety: All rugs have non-skid backing?: N/A, no rugs All stairs or steps have railings?: yes Grab bars in the bathtub or shower?: yes Have non-skid surface in bathtub or shower?: yes Good home lighting?: yes Regular seat belt use?: yes Hospital stays in the last year:: no  Cognitive Assessment Difficulty concentrating, remembering, or making decisions? : no Will 6CIT or Mini Cog be Completed: yes What year is it?: 0 points What month is it?: 0 points Give patient an address phrase to remember (5 components): [address] [place]  About what time is it?: 0 points Count backwards from 20 to 1: 0 points Say the months of the year in reverse: 0 points Repeat the address phrase from earlier: 0 points 6 CIT Score: 0 points  Advance Directives (For Healthcare) Does Patient Have a Medical Advance Directive?: Yes Type of Advance Directive: Healthcare Power of Attorney Copy of Healthcare Power of Attorney in Chart?: No - copy requested Would patient like information on creating a medical advance directive?: No - Patient declined  Reviewed/Updated  Reviewed/Updated: Reviewed All (Medical, Surgical, Family, Medications, Allergies, Care Teams, Patient Goals)    Allergies (verified) Codeine, Hydrocodeine [dihydrocodeine], and Fosamax [alendronate sodium]   Current Medications (verified) Outpatient Encounter Medications as of 08/01/2024  Medication Sig   atorvastatin  (LIPITOR) 10 MG tablet Take 1 tablet (10 mg total) by mouth at bedtime. (Patient not taking: Reported on 08/01/2024)   CALCIUM  PO Take by mouth.   famotidine  (PEPCID ) 20 MG tablet One at bedtime  Multiple Vitamin (MULTIVITAMIN PO) Take 1 tablet by mouth daily. Centrum Silver   VITAMIN D  PO Take by mouth.   No facility-administered encounter medications on file as of 08/01/2024.    History: Past Medical History:  Diagnosis Date   Essential tremor 09/21/2018   GERD (gastroesophageal reflux disease)    Laryngopharyngeal reflux (LPR) 09/01/2017   Had eval by GI and pulm   Mixed hyperlipidemia 04/11/2021   Offered statin 2022   Osteoporosis    by dexa 2009 had se of fosamax   Past Surgical History:  Procedure Laterality Date   COLONOSCOPY     TONSILLECTOMY     age 9   Family History  Problem Relation Age of Onset   Hyperlipidemia Mother    Heart disease Mother    Hypertension Mother    Alzheimer's disease Mother    Pulmonary embolism Mother    Cancer Father        unsure of type   Pulmonary embolism Brother    Arthritis Brother    Arthritis Brother    Alzheimer's disease Maternal Grandmother    Cancer Maternal Grandmother    Healthy Daughter    Healthy Son    Lung cancer Maternal Aunt        non smoker   Cancer Maternal Uncle    Colon cancer Maternal Uncle    Colon cancer Maternal Grandfather    Social History   Occupational History   Occupation: retired - sewer, social research officer, government estate book  Tobacco Use   Smoking status: Never   Smokeless tobacco: Never  Vaping Use   Vaping status: Never Used  Substance and Sexual Activity   Alcohol use: No    Alcohol/week: 0.0 standard drinks of alcohol   Drug use: No   Sexual activity: Not Currently    Birth control/protection: Post-menopausal   Tobacco Counseling Counseling given: Not Answered  SDOH Screenings   Food Insecurity: No Food Insecurity (08/01/2024)  Housing: Low Risk (08/01/2024)  Transportation Needs: No Transportation Needs (08/01/2024)  Utilities: Not At Risk (08/01/2024)  Depression (PHQ2-9): Low Risk (08/01/2024)  Financial Resource Strain: Low Risk (07/28/2023)  Physical Activity: Insufficiently  Active (08/01/2024)  Social Connections: Socially Integrated (08/01/2024)  Stress: No Stress Concern Present (08/01/2024)  Tobacco Use: Low Risk (08/01/2024)  Health Literacy: Adequate Health Literacy (08/01/2024)   See flowsheets for full screening details  Depression Screen PHQ 2 & 9 Depression Scale- Over the past 2 weeks, how often have you been bothered by any of the following problems? Little interest or pleasure in doing things: 0 Feeling down, depressed, or hopeless (PHQ Adolescent also includes...irritable): 0 PHQ-2 Total Score: 0     Goals Addressed               This Visit's Progress     exercise and be more active (pt-stated)        Exercise more and be more active              Objective:    Today's Vitals   08/01/24 1001  Weight: 140 lb (63.5 kg)  Height: 5' 4.5 (1.638 m)   Body mass index is 23.66 kg/m.  Hearing/Vision screen Hearing Screening - Comments:: Pt denies any hearing issues  Vision Screening - Comments:: Wears rx glasses - up to date with routine eye exams with [facility]   Immunizations and Health Maintenance Health Maintenance  Topic Date Due   COVID-19 Vaccine (3 - 2025-26 season) 03/14/2024   Influenza Vaccine  10/11/2024 (Originally 02/12/2024)   Bone Density Scan  10/29/2024   Mammogram  11/01/2024   Medicare Annual Wellness (AWV)  08/01/2025   Colonoscopy  07/15/2027   DTaP/Tdap/Td (3 - Td or Tdap) 09/19/2027   Pneumococcal Vaccine: 50+ Years  Completed   Hepatitis C Screening  Completed   Zoster Vaccines- Shingrix   Completed   Meningococcal B Vaccine  Aged Out        Assessment/Plan:  This is a routine wellness examination for Breezie.  Patient Care Team: Jodie Lavern CROME, MD as PCP - General (Family Medicine) Aneita Gwendlyn DASEN, MD (Inactive) (Gastroenterology) Darlean Ozell NOVAK, MD as Consulting Physician (Pulmonary Disease) Joshua Alm Hamilton, MD as Consulting Physician (Neurosurgery) Mat Browning, MD as Consulting  Physician (Obstetrics and Gynecology)  I have personally reviewed and noted the following in the patients chart:   Medical and social history Use of alcohol, tobacco or illicit drugs  Current medications and supplements including opioid prescriptions. Functional ability and status Nutritional status Physical activity Advanced directives List of other physicians Hospitalizations, surgeries, and ER visits in previous 12 months Vitals Screenings to include cognitive, depression, and falls Referrals and appointments  No orders of the defined types were placed in this encounter.  In addition, I have reviewed and discussed with patient certain preventive protocols, quality metrics, and best practice recommendations. A written personalized care plan for preventive services as well as general preventive health recommendations were provided to patient.   Ellouise VEAR Haws, LPN   8/80/7973   Return in about 53 weeks (around 08/07/2025).  After Visit Summary: (MyChart) Due to this being a telephonic visit, the after visit summary with patients personalized plan was offered to patient via MyChart   Nurse Notes: HM Addressed: Vaccines Due: and discussed   "

## 2025-05-25 ENCOUNTER — Encounter: Admitting: Family Medicine

## 2025-08-03 ENCOUNTER — Ambulatory Visit
